# Patient Record
Sex: Female | Born: 1955
Health system: Southern US, Community
[De-identification: ages and names within clinical notes are randomized; demographics above are authoritative.]

## PROBLEM LIST (undated history)

## (undated) DIAGNOSIS — K5120 Ulcerative (chronic) proctitis without complications: Secondary | ICD-10-CM

## (undated) DIAGNOSIS — K648 Other hemorrhoids: Secondary | ICD-10-CM

## (undated) DIAGNOSIS — Z9889 Other specified postprocedural states: Secondary | ICD-10-CM

## (undated) DIAGNOSIS — T7840XA Allergy, unspecified, initial encounter: Secondary | ICD-10-CM

## (undated) DIAGNOSIS — N760 Acute vaginitis: Secondary | ICD-10-CM

## (undated) DIAGNOSIS — M1990 Unspecified osteoarthritis, unspecified site: Secondary | ICD-10-CM

## (undated) DIAGNOSIS — M7500 Adhesive capsulitis of unspecified shoulder: Secondary | ICD-10-CM

## (undated) DIAGNOSIS — I10 Essential (primary) hypertension: Secondary | ICD-10-CM

## (undated) DIAGNOSIS — K270 Acute peptic ulcer, site unspecified, with hemorrhage: Secondary | ICD-10-CM

## (undated) DIAGNOSIS — E785 Hyperlipidemia, unspecified: Secondary | ICD-10-CM

## (undated) DIAGNOSIS — G2581 Restless legs syndrome: Secondary | ICD-10-CM

## (undated) DIAGNOSIS — M8580 Other specified disorders of bone density and structure, unspecified site: Secondary | ICD-10-CM

## (undated) DIAGNOSIS — F419 Anxiety disorder, unspecified: Secondary | ICD-10-CM

## (undated) DIAGNOSIS — R112 Nausea with vomiting, unspecified: Secondary | ICD-10-CM

## (undated) DIAGNOSIS — M949 Disorder of cartilage, unspecified: Secondary | ICD-10-CM

## (undated) DIAGNOSIS — G4730 Sleep apnea, unspecified: Secondary | ICD-10-CM

## (undated) DIAGNOSIS — G4733 Obstructive sleep apnea (adult) (pediatric): Secondary | ICD-10-CM

## (undated) DIAGNOSIS — K219 Gastro-esophageal reflux disease without esophagitis: Secondary | ICD-10-CM

## (undated) DIAGNOSIS — M797 Fibromyalgia: Secondary | ICD-10-CM

## (undated) DIAGNOSIS — D126 Benign neoplasm of colon, unspecified: Secondary | ICD-10-CM

## (undated) DIAGNOSIS — E0590 Thyrotoxicosis, unspecified without thyrotoxic crisis or storm: Secondary | ICD-10-CM

## (undated) DIAGNOSIS — E039 Hypothyroidism, unspecified: Secondary | ICD-10-CM

## (undated) DIAGNOSIS — F518 Other sleep disorders not due to a substance or known physiological condition: Secondary | ICD-10-CM

## (undated) DIAGNOSIS — M899 Disorder of bone, unspecified: Secondary | ICD-10-CM

## (undated) DIAGNOSIS — E559 Vitamin D deficiency, unspecified: Secondary | ICD-10-CM

## (undated) DIAGNOSIS — U071 COVID-19: Secondary | ICD-10-CM

## (undated) HISTORY — DX: Essential (primary) hypertension: I10

## (undated) HISTORY — DX: Acute peptic ulcer, site unspecified, with hemorrhage: K27.0

## (undated) HISTORY — DX: Unspecified osteoarthritis, unspecified site: M19.90

## (undated) HISTORY — DX: Allergy, unspecified, initial encounter: T78.40XA

## (undated) HISTORY — PX: LAPAROSCOPY: SHX197

## (undated) HISTORY — PX: SIGMOIDOSCOPY: SUR1295

## (undated) HISTORY — DX: Vitamin D deficiency, unspecified: E55.9

## (undated) HISTORY — DX: Other sleep disorders not due to a substance or known physiological condition: F51.8

## (undated) HISTORY — DX: Gastro-esophageal reflux disease without esophagitis: K21.9

## (undated) HISTORY — DX: Hyperlipidemia, unspecified: E78.5

## (undated) HISTORY — DX: Fibromyalgia: M79.7

## (undated) HISTORY — DX: Obstructive sleep apnea (adult) (pediatric): G47.33

## (undated) HISTORY — DX: Restless legs syndrome: G25.81

## (undated) HISTORY — PX: UPPER GASTROINTESTINAL ENDOSCOPY: SHX188

## (undated) HISTORY — PX: BREAST CYST ASPIRATION: SHX578

## (undated) HISTORY — PX: ABDOMINAL HYSTERECTOMY: SHX81

## (undated) HISTORY — DX: Disorder of cartilage, unspecified: M94.9

## (undated) HISTORY — DX: Adhesive capsulitis of unspecified shoulder: M75.00

## (undated) HISTORY — DX: Thyrotoxicosis, unspecified without thyrotoxic crisis or storm: E05.90

## (undated) HISTORY — DX: Ulcerative (chronic) proctitis without complications: K51.20

## (undated) HISTORY — DX: Hypothyroidism, unspecified: E03.9

## (undated) HISTORY — DX: Other hemorrhoids: K64.8

## (undated) HISTORY — DX: Sleep apnea, unspecified: G47.30

## (undated) HISTORY — DX: Anxiety disorder, unspecified: F41.9

## (undated) HISTORY — DX: Benign neoplasm of colon, unspecified: D12.6

## (undated) HISTORY — PX: KNEE ARTHROSCOPY: SUR90

## (undated) HISTORY — PX: NASAL SEPTUM SURGERY: SHX37

## (undated) HISTORY — DX: Disorder of bone, unspecified: M89.9

## (undated) HISTORY — DX: COVID-19: U07.1

## (undated) HISTORY — PX: COLONOSCOPY: SHX174

## (undated) HISTORY — PX: TONSILLECTOMY AND ADENOIDECTOMY: SUR1326

---

## 1999-03-07 ENCOUNTER — Other Ambulatory Visit: Admission: RE | Admit: 1999-03-07 | Discharge: 1999-03-07 | Payer: Self-pay | Admitting: Obstetrics and Gynecology

## 2001-03-11 ENCOUNTER — Other Ambulatory Visit: Admission: RE | Admit: 2001-03-11 | Discharge: 2001-03-11 | Payer: Self-pay | Admitting: Obstetrics and Gynecology

## 2002-04-22 ENCOUNTER — Other Ambulatory Visit: Admission: RE | Admit: 2002-04-22 | Discharge: 2002-04-22 | Payer: Self-pay | Admitting: Obstetrics and Gynecology

## 2002-05-05 ENCOUNTER — Encounter: Payer: Self-pay | Admitting: Pulmonary Disease

## 2002-06-07 ENCOUNTER — Ambulatory Visit (HOSPITAL_BASED_OUTPATIENT_CLINIC_OR_DEPARTMENT_OTHER): Admission: RE | Admit: 2002-06-07 | Discharge: 2002-06-07 | Payer: Self-pay | Admitting: Pulmonary Disease

## 2002-06-07 ENCOUNTER — Encounter: Payer: Self-pay | Admitting: Pulmonary Disease

## 2002-11-04 ENCOUNTER — Ambulatory Visit (HOSPITAL_COMMUNITY): Admission: RE | Admit: 2002-11-04 | Discharge: 2002-11-05 | Payer: Self-pay | Admitting: Otolaryngology

## 2002-11-04 ENCOUNTER — Encounter (INDEPENDENT_AMBULATORY_CARE_PROVIDER_SITE_OTHER): Payer: Self-pay | Admitting: *Deleted

## 2003-08-03 ENCOUNTER — Encounter: Payer: Self-pay | Admitting: Internal Medicine

## 2003-08-07 ENCOUNTER — Encounter: Admission: RE | Admit: 2003-08-07 | Discharge: 2003-08-07 | Payer: Self-pay | Admitting: Rheumatology

## 2004-03-01 ENCOUNTER — Ambulatory Visit: Payer: Self-pay | Admitting: Pulmonary Disease

## 2004-04-14 ENCOUNTER — Encounter: Payer: Self-pay | Admitting: Pulmonary Disease

## 2004-04-14 ENCOUNTER — Ambulatory Visit (HOSPITAL_BASED_OUTPATIENT_CLINIC_OR_DEPARTMENT_OTHER): Admission: RE | Admit: 2004-04-14 | Discharge: 2004-04-14 | Payer: Self-pay | Admitting: Pulmonary Disease

## 2004-04-21 ENCOUNTER — Ambulatory Visit: Payer: Self-pay | Admitting: Pulmonary Disease

## 2004-05-10 ENCOUNTER — Ambulatory Visit: Payer: Self-pay | Admitting: Pulmonary Disease

## 2005-03-13 ENCOUNTER — Ambulatory Visit: Payer: Self-pay | Admitting: Pulmonary Disease

## 2005-08-20 ENCOUNTER — Ambulatory Visit: Payer: Self-pay | Admitting: Internal Medicine

## 2006-09-16 ENCOUNTER — Ambulatory Visit: Payer: Self-pay | Admitting: Pulmonary Disease

## 2007-06-18 ENCOUNTER — Encounter: Payer: Self-pay | Admitting: Pulmonary Disease

## 2007-08-01 DIAGNOSIS — K270 Acute peptic ulcer, site unspecified, with hemorrhage: Secondary | ICD-10-CM | POA: Insufficient documentation

## 2007-11-05 ENCOUNTER — Ambulatory Visit: Payer: Self-pay | Admitting: Internal Medicine

## 2007-11-05 DIAGNOSIS — M949 Disorder of cartilage, unspecified: Secondary | ICD-10-CM

## 2007-11-05 DIAGNOSIS — G2581 Restless legs syndrome: Secondary | ICD-10-CM | POA: Insufficient documentation

## 2007-11-05 DIAGNOSIS — E039 Hypothyroidism, unspecified: Secondary | ICD-10-CM | POA: Insufficient documentation

## 2007-11-05 DIAGNOSIS — M899 Disorder of bone, unspecified: Secondary | ICD-10-CM

## 2007-11-05 DIAGNOSIS — K5900 Constipation, unspecified: Secondary | ICD-10-CM | POA: Insufficient documentation

## 2007-11-05 DIAGNOSIS — G4733 Obstructive sleep apnea (adult) (pediatric): Secondary | ICD-10-CM

## 2007-11-05 DIAGNOSIS — I10 Essential (primary) hypertension: Secondary | ICD-10-CM | POA: Insufficient documentation

## 2007-11-05 DIAGNOSIS — M810 Age-related osteoporosis without current pathological fracture: Secondary | ICD-10-CM | POA: Insufficient documentation

## 2007-11-05 DIAGNOSIS — K219 Gastro-esophageal reflux disease without esophagitis: Secondary | ICD-10-CM | POA: Insufficient documentation

## 2007-11-05 DIAGNOSIS — E559 Vitamin D deficiency, unspecified: Secondary | ICD-10-CM | POA: Insufficient documentation

## 2007-11-05 DIAGNOSIS — D126 Benign neoplasm of colon, unspecified: Secondary | ICD-10-CM

## 2007-11-05 DIAGNOSIS — K589 Irritable bowel syndrome without diarrhea: Secondary | ICD-10-CM | POA: Insufficient documentation

## 2007-11-05 DIAGNOSIS — E785 Hyperlipidemia, unspecified: Secondary | ICD-10-CM | POA: Insufficient documentation

## 2007-11-19 ENCOUNTER — Telehealth (INDEPENDENT_AMBULATORY_CARE_PROVIDER_SITE_OTHER): Payer: Self-pay | Admitting: *Deleted

## 2007-12-03 ENCOUNTER — Telehealth (INDEPENDENT_AMBULATORY_CARE_PROVIDER_SITE_OTHER): Payer: Self-pay | Admitting: *Deleted

## 2007-12-03 ENCOUNTER — Ambulatory Visit: Payer: Self-pay | Admitting: Pulmonary Disease

## 2007-12-03 DIAGNOSIS — F518 Other sleep disorders not due to a substance or known physiological condition: Secondary | ICD-10-CM

## 2007-12-03 DIAGNOSIS — G4700 Insomnia, unspecified: Secondary | ICD-10-CM | POA: Insufficient documentation

## 2008-02-11 ENCOUNTER — Telehealth: Payer: Self-pay | Admitting: Internal Medicine

## 2008-02-13 ENCOUNTER — Ambulatory Visit: Payer: Self-pay | Admitting: Gastroenterology

## 2008-02-13 DIAGNOSIS — Z860100 Personal history of colon polyps, unspecified: Secondary | ICD-10-CM | POA: Insufficient documentation

## 2008-02-13 DIAGNOSIS — K625 Hemorrhage of anus and rectum: Secondary | ICD-10-CM | POA: Insufficient documentation

## 2008-03-16 DIAGNOSIS — IMO0001 Reserved for inherently not codable concepts without codable children: Secondary | ICD-10-CM | POA: Insufficient documentation

## 2008-03-17 ENCOUNTER — Encounter: Payer: Self-pay | Admitting: Internal Medicine

## 2008-03-22 ENCOUNTER — Ambulatory Visit: Payer: Self-pay | Admitting: Internal Medicine

## 2008-03-24 ENCOUNTER — Telehealth: Payer: Self-pay | Admitting: Internal Medicine

## 2008-03-25 ENCOUNTER — Encounter: Payer: Self-pay | Admitting: Internal Medicine

## 2008-03-25 ENCOUNTER — Ambulatory Visit: Payer: Self-pay | Admitting: Internal Medicine

## 2008-03-27 ENCOUNTER — Encounter: Payer: Self-pay | Admitting: Internal Medicine

## 2008-05-05 ENCOUNTER — Ambulatory Visit: Payer: Self-pay | Admitting: Internal Medicine

## 2008-05-05 DIAGNOSIS — K5120 Ulcerative (chronic) proctitis without complications: Secondary | ICD-10-CM

## 2008-09-15 ENCOUNTER — Encounter: Admission: RE | Admit: 2008-09-15 | Discharge: 2008-12-14 | Payer: Self-pay | Admitting: Rheumatology

## 2009-01-28 ENCOUNTER — Ambulatory Visit: Payer: Self-pay | Admitting: Pulmonary Disease

## 2009-01-28 DIAGNOSIS — G4710 Hypersomnia, unspecified: Secondary | ICD-10-CM | POA: Insufficient documentation

## 2009-02-22 ENCOUNTER — Encounter: Payer: Self-pay | Admitting: Pulmonary Disease

## 2009-02-22 ENCOUNTER — Ambulatory Visit (HOSPITAL_BASED_OUTPATIENT_CLINIC_OR_DEPARTMENT_OTHER): Admission: RE | Admit: 2009-02-22 | Discharge: 2009-02-22 | Payer: Self-pay | Admitting: Pulmonary Disease

## 2009-03-08 ENCOUNTER — Ambulatory Visit: Payer: Self-pay | Admitting: Pulmonary Disease

## 2009-03-16 ENCOUNTER — Encounter: Payer: Self-pay | Admitting: Physician Assistant

## 2009-03-17 ENCOUNTER — Telehealth: Payer: Self-pay | Admitting: Internal Medicine

## 2009-03-18 ENCOUNTER — Ambulatory Visit: Payer: Self-pay | Admitting: Gastroenterology

## 2009-03-18 DIAGNOSIS — R197 Diarrhea, unspecified: Secondary | ICD-10-CM | POA: Insufficient documentation

## 2009-03-21 ENCOUNTER — Encounter: Payer: Self-pay | Admitting: Physician Assistant

## 2009-03-21 LAB — CONVERTED CEMR LAB
Basophils Relative: 5.1 % — ABNORMAL HIGH (ref 0.0–3.0)
Lymphocytes Relative: 26.5 % (ref 12.0–46.0)
MCHC: 32.9 g/dL (ref 30.0–36.0)
MCV: 92.4 fL (ref 78.0–100.0)
Monocytes Relative: 7.7 % (ref 3.0–12.0)
Neutro Abs: 3.9 10*3/uL (ref 1.4–7.7)
Neutrophils Relative %: 54.9 % (ref 43.0–77.0)
RBC: 4.47 M/uL (ref 3.87–5.11)
RDW: 12.8 % (ref 11.5–14.6)
WBC: 7.1 10*3/uL (ref 4.5–10.5)

## 2009-04-26 ENCOUNTER — Ambulatory Visit: Payer: Self-pay | Admitting: Internal Medicine

## 2009-05-23 ENCOUNTER — Telehealth: Payer: Self-pay | Admitting: Pulmonary Disease

## 2009-06-27 ENCOUNTER — Telehealth: Payer: Self-pay | Admitting: Internal Medicine

## 2009-07-26 ENCOUNTER — Telehealth (INDEPENDENT_AMBULATORY_CARE_PROVIDER_SITE_OTHER): Payer: Self-pay | Admitting: *Deleted

## 2009-08-03 ENCOUNTER — Ambulatory Visit: Payer: Self-pay | Admitting: Internal Medicine

## 2010-03-28 NOTE — Progress Notes (Signed)
Summary: Canasa  Phone Note Outgoing Call   Call placed by: Lamona Curl CMA Duncan Dull),  Jun 27, 2009 11:53 AM Call placed to: Patient Summary of Call: I have called patient x 2 and have left messages for her to call back regarding her canasa suppositories. I have again called her today and left a message for her to return my call. I denied a three month prescription for canasa because patient is due for an office visit and I want to make certain that she does follow up. I will be glad to send her enough suppositories to a local pharmacy to get her through until her office visit at which time a decision can be made as to whether this will be a long term thing. However, I have been unable to contact patient or get her to return my calls for me to tell her this. I will await a return call from her at this time. Initial call taken by: Lamona Curl CMA Duncan Dull),  Jun 27, 2009 11:56 AM     Appended Document: Canasa    Clinical Lists Changes  Medications: Changed medication from CANASA 1000 MG SUPP (MESALAMINE) Insert 1 suppository into rectum two times a day as directed. to CANASA 1000 MG SUPP (MESALAMINE) Insert 1 suppository into rectum two times a day as directed. - Signed Rx of CANASA 1000 MG SUPP (MESALAMINE) Insert 1 suppository into rectum two times a day as directed.;  #60 x 0;  Signed;  Entered by: Lamona Curl CMA (AAMA);  Authorized by: Hart Carwin MD;  Method used: Electronically to [facility] [address] [place], [place]  [address], Ph: [phone], Fax: [phone]    Prescriptions: CANASA 1000 MG SUPP (MESALAMINE) Insert 1 suppository into rectum two times a day as directed.  #60 x 0   Entered by:   Lamona Curl CMA (AAMA)   Authorized by:   Hart Carwin MD   Signed by:   Lamona Curl CMA (AAMA) on 06/28/2009   Method used:   Electronically to        [facility] (retail)       [address]       [place], [place]  [id number]       Ph: [phone]       Fax: [phone]   RxID:   [id number]

## 2010-03-28 NOTE — Assessment & Plan Note (Signed)
Summary: ULCERATIVE COLITIS FLARE/BLEEDING FOR 4 WEEKS      (DR.BRODIE...   History of Present Illness Visit Type: Follow-up Visit Primary GI MD: Lina Sar MD Primary Provider: Adrian Prince, MD Requesting Provider: n/a Chief Complaint: UC flare, rectal bleeding History of Present Illness:   55 YO FEMALE KNOWN TO DR Juanda Chance WHO WAS DX WITH ULCERATIVE PROCTITIS ABOUT ONE YEAR AGO. SHE HAS BEEN TRATED WITH CANASA SUPPOSITORIES AND BRIEFLY TOOK PROCTOFOAM BUT STOPPED DUE TO BURNING. SHE WAS ILL OVER THE HOLIDAYS AND WAS TREATED WITH OMNICEF AND A STEROID DOSE PACK. SINCE THAT TIME HAS HAD FREQUENT LOOSE STOOLS, URGENCY, AND HAS BEEN PASSING BLOOD AND MUCOUS WITH BMS.SHE HAD A FEW DAYS OF UPPER ABDOMINAL DISCOMFORT WHICH HAVE SINCE RESOLVED. HAS ONLY HAS ONE BM TODAY, GENERALLY 3-4 DAILY POST PRANDIALLY OVER THE PAST COUPLE WEEKS.   GI Review of Systems    Reports abdominal pain and  acid reflux.     Location of  Abdominal pain: RLQ.    Denies belching, bloating, chest pain, dysphagia with liquids, dysphagia with solids, heartburn, loss of appetite, nausea, vomiting, vomiting blood, and  weight loss.      Reports change in bowel habits, diarrhea, and  rectal bleeding.     Denies anal fissure, black tarry stools, diverticulosis, fecal incontinence, heme positive stool, hemorrhoids, irritable bowel syndrome, jaundice, light color stool, liver problems, and  rectal pain.   Current Medications (verified): 1)  Mirapex 1 Mg  Tabs (Pramipexole Dihydrochloride) .... 1/2 Tab By Mouth At Bedtime 2)  Calcium Carbonate-Vitamin D 600-400 Mg-Unit  Tabs (Calcium Carbonate-Vitamin D) .... Take 3 Tabs By Mouth Daily 3)  Vitamin D 52841 Unit Caps (Ergocalciferol) .... Once A Week 4)  Vivelle-Dot 0.05 Mg/24hr Pttw (Estradiol) .... Twice A Week 5)  Synthroid 137 Mcg Tabs (Levothyroxine Sodium) .... One Tablet By Mouth Once Daily 6)  Zoloft 50 Mg Tabs (Sertraline Hcl) .... 1.5 Tabs By Mouth Once Daily 7)   Spironolactone 25 Mg Tabs (Spironolactone) .... Take 1 Tablet By Mouth Two Times A Day 8)  Effexor Xr 75 Mg Xr24h-Cap (Venlafaxine Hcl) .... 4 Tabs By Mouth Daily 9)  Alprazolam 1 Mg Tabs (Alprazolam) .... 1.5 Tabs By Mouth At Bedtime 10)  Flexeril 10 Mg Tabs (Cyclobenzaprine Hcl) .... Take 1 Tab By Mouth At Bedtime 11)  Keppra 500 Mg Tabs (Levetiracetam) .... 3 Tabs By Mouth At Bedtime 12)  Prilosec 40 Mg Cpdr (Omeprazole) .... Take 1 Tablet By Mouth Once A Day 13)  Canasa 1000 Mg  Supp (Mesalamine) .... Insert 1 Suppository Into Rectum At Bedtime As Needed 14)  Milk of Magnesia .... As Needed  Allergies (verified): 1)  ! Tetracycline 2)  ! * Oxycodone  Past History:  Past Medical History: ULCERATIVE PROCTITIS FIBROMYALGIA (ICD-729.1) Hx of ACUTE PEPTC ULCR UNS SITE W/HEM W/O MENTION OBST (ICD-533.00) PERSONAL HX COLONIC POLYPS (ICD-V12.72) INADEQUATE SLEEP HYGIENE (ICD-307.49) HYPOTHYROIDISM (ICD-244.9) OBSTRUCTIVE SLEEP APNEA (ICD-327.23) RESTLESS LEG SYNDROME (ICD-333.94) OSTEOPENIA (ICD-733.90) VITAMIN D DEFICIENCY (ICD-268.9) HYPERLIPIDEMIA (ICD-272.4) HYPERTENSION (ICD-401.9) CONSTIPATION (ICD-564.00) GERD (ICD-530.81)      Past Surgical History: Reviewed history from 03/16/2008 and no changes required. c-section Hysterectomy Knee Arthroscopy- right Tonsillectomy lapraroscopies - x 4 right breast cyst aspiration  Family History: Reviewed history from 02/13/2008 and no changes required. Family History of Colon Cancer:Grandmother  Family History of Breast Cancer:grandmother, aunt Luekemia P grandfather Lung cancer- M granfather Family History of Kidney Disease: Mother   due to diaabetes Family History of Diabetes: Mother, father  Social  History: Reviewed history from 02/13/2008 and no changes required. Patient has never smoked.  Alcohol Use - no Illicit Drug Use - no Daily Caffeine Use 1 cup coffee per day  Review of Systems  The patient denies  allergy/sinus, anemia, anxiety-new, arthritis/joint pain, back pain, blood in urine, breast changes/lumps, change in vision, confusion, cough, coughing up blood, depression-new, fainting, fatigue, fever, headaches-new, hearing problems, heart murmur, heart rhythm changes, itching, menstrual pain, muscle pains/cramps, night sweats, nosebleeds, pregnancy symptoms, shortness of breath, skin rash, sleeping problems, sore throat, swelling of feet/legs, swollen lymph glands, thirst - excessive , urination - excessive , urination changes/pain, urine leakage, vision changes, and voice change.         ROS OTHERWISE AS IN HPI  Vital Signs:  Patient profile:   55 year old female Height:      68 inches Weight:      189.38 pounds BMI:     28.90 Pulse rate:   68 / minute Pulse rhythm:   regular BP sitting:   110 / 64  (left arm)  Vitals Entered By: Chales Abrahams CMA Duncan Dull) (March 18, 2009 2:12 PM)  Physical Exam  General:  Well developed, well nourished, no acute distress. Head:  Normocephalic and atraumatic. Eyes:  PERRLA, no icterus. Lungs:  Clear throughout to auscultation. Heart:  Regular rate and rhythm; no murmurs, rubs,  or bruits. Abdomen:  SOFT, BASICALLY NONTENDER, MILD RLQ TENDERNESS AT EDGE OF HYSTERECTOMY SCAR, BS+,NO MASS OR HSM Rectal:  NOT DONE Extremities:  No clubbing, cyanosis, edema or deformities noted. Neurologic:  Alert and  oriented x4;  grossly normal neurologically. Psych:  Alert and cooperative. Normal mood and affect.  Impression & Recommendations:  Problem # 1:  ULCERATIVE PROCTITIS (ICD-556.2) Assessment Deteriorated 55 YO WITH ULCERATIVE PROCTITIS WITH EXACERBATION OF SXS WITH BLOOD AND MUCOID /LOOSE STOOLS X 2 WEEKS, POST ABX FOR URI. SUSPECT COLITIS EXACERBATION, R/O C. DIFF THOUGH LESS LIKELY STOOL CULTURE AS BELOW CBC TODAY START ALIGN ONE DAILY INCREASE CANASA TO TWICE DAILY X 3 WEEKS START LIALDA 1.2MG ;2 TABLETS TWICE DAILY FOLLOW UP WITH DR Juanda Chance IN 2-3  WEEKS. Orders: T-Culture, C-Diff Toxin A/B ([id number]) TLB-CBC Platelet - w/Differential ([id number])  Problem # 2:  Family Hx of COLON CANCER (ICD-153.9) Assessment: Comment Only LAST COLONOSCOPY 1/10  NO POLYPS  Problem # 3:  FIBROMYALGIA (ICD-729.1) Assessment: Comment Only  Problem # 4:  RESTLESS LEG SYNDROME (ICD-333.94) Assessment: Comment Only  Patient Instructions: 1)  Please go to the lab, basement level. 2)  Increase the Canasa Suppositories to twice daily x 3 weeks. 3)  Take Align capsule 1 daily x 30 days. Coupon provided. 4)  Lialda take 2 tab 5)  We made you a follow up appoinitment with Dr. Juanda Chance for 04-26-09.  6)  Copy sent to : Dr. Evlyn Kanner 7)  The medication list was reviewed and reconciled.  All changed / newly prescribed medications were explained.  A complete medication list was provided to the patient / caregiver.  Prescriptions: LIALDA 1.2 GM TBEC (MESALAMINE) Take 2 tab twice daily  #120 x 1   Entered by:   Lowry Ram NCMA   Authorized by:   Sammuel Cooper PA-c   Signed by:   Lowry Ram NCMA on 03/18/2009   Method used:   Electronically to        [facility] (retail)       [address]       [place], [place]  [address]  Ph: [phone]       Fax: [phone]   RxID:   [id number] CANASA 1000 MG SUPP (MESALAMINE) Use suppository twice daily x 3 weeks  #42 x 0   Entered by:   Lowry Ram NCMA   Authorized by:   Sammuel Cooper PA-c   Signed by:   Lowry Ram NCMA on 03/18/2009   Method used:   Electronically to        [facility] (retail)       [address]       [place], [place]  [id number]       Ph: [phone]       Fax: [phone]   RxID:   [id number]

## 2010-03-28 NOTE — Assessment & Plan Note (Signed)
Summary: follow up appt/lk   History of Present Illness Visit Type: Follow-up Visit Primary GI MD: Lina Sar MD Primary Provider: Adrian Prince, MD Requesting Provider: n/a Chief Complaint: Patient here for routine follow up of colitis. She denies any current GI symptoms. No abdominal pain, change in bowels or blood in the stool. History of Present Illness:   This is a 55 year old white female with ulcerative proctitis demonstrated on a colonoscopy in January 2010. She had an exacerbation of her colitis after a course of broad-spectrum antibiotics for an upper respiratory infection. She was put on Canasa suppositories twice a day, probiotics and mesalamine 1.2 g daily. She has intentionally lost 20 pounds and her gastroesophageal reflux has also improved. The last upper endoscopy was in June 2005. There is a family history of colon cancer and a personal history of a tubular adenoma found on a colonoscopy in June 2005. Patient comes today for a routine follow up of her colitis. She states that she is doing well and denies any gastroentestinal symptoms at this time. There is no abdominal pain, change in bowels or appearance of blood in the stool.   GI Review of Systems    Reports weight loss.   Weight loss of 20  pounds over 5 months.   Denies abdominal pain, acid reflux, belching, bloating, chest pain, dysphagia with liquids, dysphagia with solids, heartburn, loss of appetite, nausea, vomiting, vomiting blood, and  weight gain.        Denies anal fissure, black tarry stools, change in bowel habit, constipation, diarrhea, diverticulosis, fecal incontinence, heme positive stool, hemorrhoids, irritable bowel syndrome, jaundice, light color stool, liver problems, rectal bleeding, and  rectal pain.    Current Medications (verified): 1)  Mirapex 0.5 Mg Tabs (Pramipexole Dihydrochloride) .Marland Kitchen.. 1 By Mouth At Bedtime 2)  Calcium Carbonate-Vitamin D 600-400 Mg-Unit  Tabs (Calcium Carbonate-Vitamin D)  .... Take 3 Tabs By Mouth Daily 3)  Vitamin D 16109 Unit Caps (Ergocalciferol) .... Once A Week 4)  Vivelle-Dot 0.05 Mg/24hr Pttw (Estradiol) .... Twice A Week 5)  Synthroid 150 Mcg Tabs (Levothyroxine Sodium) .... Take 1 Tablet By Mouth Once A Day 6)  Zoloft 50 Mg Tabs (Sertraline Hcl) .... 1.5 Tabs By Mouth Once Daily 7)  Spironolactone 25 Mg Tabs (Spironolactone) .... Take 1 Tablet By Mouth Two Times A Day 8)  Alprazolam 1 Mg Tabs (Alprazolam) .... 1.5 Tabs By Mouth At Bedtime 9)  Flexeril 10 Mg Tabs (Cyclobenzaprine Hcl) .... Take 1 Tab By Mouth At Bedtime 10)  Keppra 500 Mg Tabs (Levetiracetam) .... 2 Tabs By Mouth At Bedtime 11)  Canasa 1000 Mg Supp (Mesalamine) .... Insert 1 Suppository Into Rectum Once Daily As Directed 12)  Lialda 1.2 Gm Tbec (Mesalamine) .... Take 1 Tablet By Mouth Once A Day  Allergies (verified): 1)  ! Tetracycline 2)  ! * Oxycodone  Past History:  Past Medical History: Reviewed history from 03/18/2009 and no changes required. ULCERATIVE PROCTITIS FIBROMYALGIA (ICD-729.1) Hx of ACUTE PEPTC ULCR UNS SITE W/HEM W/O MENTION OBST (ICD-533.00) PERSONAL HX COLONIC POLYPS (ICD-V12.72) INADEQUATE SLEEP HYGIENE (ICD-307.49) HYPOTHYROIDISM (ICD-244.9) OBSTRUCTIVE SLEEP APNEA (ICD-327.23) RESTLESS LEG SYNDROME (ICD-333.94) OSTEOPENIA (ICD-733.90) VITAMIN D DEFICIENCY (ICD-268.9) HYPERLIPIDEMIA (ICD-272.4) HYPERTENSION (ICD-401.9) CONSTIPATION (ICD-564.00) GERD (ICD-530.81)      Past Surgical History: Reviewed history from 04/26/2009 and no changes required. c-section Hysterectomy Knee Arthroscopy- right x2 Tonsillectomy & adnoids lapraroscopies - x 4 right breast cyst aspiration deviated septum/ UP3 - Dr. Shelle Iron  Family History: Reviewed history from 02/13/2008 and  no changes required. Family History of Colon Cancer:Grandmother  Family History of Breast Cancer:grandmother, aunt Luekemia P grandfather Lung cancer- M granfather Family History of  Kidney Disease: Mother   due to diaabetes Family History of Diabetes: Mother, father Melanoma-Sister  Social History: Reviewed history from 04/26/2009 and no changes required. Alcohol Use - no Illicit Drug Use - no Daily Caffeine Use 1 cup coffee per day Patient is a former smoker. Stopped @31  Patient gets regular exercise. Occupation: Manufacturing engineer  Review of Systems  The patient denies allergy/sinus, anemia, anxiety-new, arthritis/joint pain, back pain, blood in urine, breast changes/lumps, change in vision, confusion, cough, coughing up blood, depression-new, fainting, fatigue, fever, headaches-new, hearing problems, heart murmur, heart rhythm changes, itching, menstrual pain, muscle pains/cramps, night sweats, nosebleeds, pregnancy symptoms, shortness of breath, skin rash, sleeping problems, sore throat, swelling of feet/legs, swollen lymph glands, thirst - excessive , urination - excessive , urination changes/pain, urine leakage, vision changes, and voice change.         Pertinent positive and negative review of systems were noted in the above HPI. All other ROS was otherwise negative.   Vital Signs:  Patient profile:   55 year old female Height:      68 inches Weight:      174.50 pounds BMI:     26.63 BSA:     1.93 Pulse rate:   80 / minute Pulse rhythm:   regular BP sitting:   108 / 80  (right arm)  Vitals Entered By: Lamona Curl CMA Duncan Dull) (August 03, 2009 8:41 AM)  Physical Exam  General:  Well developed, well nourished, no acute distress. Lungs:  Clear throughout to auscultation. Heart:  Regular rate and rhythm; no murmurs, rubs,  or bruits. Abdomen:  Soft, nontender and nondistended. No masses, hepatosplenomegaly or hernias noted. Normal bowel sounds. Rectal:  anoscopic exam reveals normal appearing rectal mucosa with no evidence of inflammation, exudate or ulceration. Stool is Hemoccult negative. Extremities:  No clubbing, cyanosis, edema or deformities  noted. Skin:  Intact without significant lesions or rashes. Psych:  Alert and cooperative. Normal mood and affect.   Impression & Recommendations:  Problem # 1:  ULCERATIVE PROCTITIS (ICD-556.2) Patient's ulcerative colitis is in remission. She may decrease her Canasa suppositories to a maintenance dose of 1,000 mg weekly and may also discontinue her Lialda 1.2 g daily. She will maintain a high-fiber, low-fat diet and will follow up with [place] in one year.  Problem # 2:  Family Hx of COLON CANCER (ICD-153.9) Patient will be due for a recall colonoscopy in January 2015.  Problem # 3:  IBS (ICD-564.1) Patient has intermittent right upper quadrant abdominal pain of unknown etiology. A colonoscopy completed recently showed a normal cecum. She may need a pelvic ultrasound for further evaluation of the pain.  Patient Instructions: 1)  Canasa suppositories 1,000 mg weekly. 2)  Discontinue Lialda. 3)  high-fiber diet. 4)  Follow up with Dr.Mody for GYN exam and possible pelvic ultrasound to evaluate right lower quadrant abdominal pain. 5)  Copy sent to : Dr Nikki Dom OBGyn , Dr Kathie Rhodes.[place] 6)  The medication list was reviewed and reconciled.  All changed / newly prescribed medications were explained.  A complete medication list was provided to the patient / caregiver.

## 2010-03-28 NOTE — Progress Notes (Signed)
Summary: change mirapex dose  Phone Note Outgoing Call   Call placed by: Boone Master CNA,  May 23, 2009 5:16 PM Call placed to: Patient Summary of Call: doing refills for triage.  came upon refill request for pt's mirapex 1mg .  there is a note attached by [facility] stating "let pt know after reviewing everything, i would like to look at lowering her mirapex dose - 1mg  at bedtime can contribute to daytime sleepiness.  let's try 0.5mg  at bedtime."    LM w/ FM TCB to discuss this change with patient.  told family member to tell her to ask for me by name or ask for the triage nurse if i am unavailable.  Initial call taken by: Boone Master CNA,  May 23, 2009 5:19 PM  Follow-up for Phone Call        pt advised of the change and new rx sent. Carron Curie CMA  May 24, 2009 9:30 AM     Prescriptions: MIRAPEX 1 MG  TABS (PRAMIPEXOLE DIHYDROCHLORIDE) 1/2 tab by mouth at bedtime  #45 x 0   Entered by:   Carron Curie CMA   Authorized by:   Barbaraann Share MD   Signed by:   Carron Curie CMA on 05/24/2009   Method used:   Faxed to ...       [facility]* (mail-order)             ,          Ph: [phone]       Fax: [phone]   RxID:   [id number]

## 2010-03-28 NOTE — Progress Notes (Signed)
Summary: TRIAGE  Phone Note Call from Patient Call back at cell [phone]   Call For: Dr Juanda Chance Summary of Call: Has Ulcerative Colitits using Canasa suppositories but they are not regulating the symptoms so well.  Initial call taken by: Leanor Kail [facility],  March 17, 2009 3:07 PM  Follow-up for Phone Call        Pt. has UC. She takes [employer]. at bedtime, no other meds.  States she has had a flare since Christmas. Then was given antibiotics/prednisone for a sinus infection. Now c/o worsening abd.pain, looser stools-but pt. takes MOM every night before she goes to bed. Sees blood with every BM for weeks. Feels weak/fatigue. 1) Pt. will see Amy Esterwood PAC on 03-18-09 at 2pm 2) Have recent labs/ov note from PCP faxed 3) If symptoms become worse call back immediately or go to ER.  Follow-up by: Laureen Ochs LPN,  March 17, 2009 3:27 PM

## 2010-03-28 NOTE — Miscellaneous (Signed)
Summary: sleep study results.  Clinical Lists Changes  sleep study shows no significant osa or movement disorder.  I have called pt and discussed with her.  Suspect her sleepiness due to poor sleep hygeine, sedating meds, and anxiety.  I have asked her to work on these things, and see how she does.

## 2010-03-28 NOTE — Progress Notes (Signed)
Summary: mirapex  Phone Note Call from Patient   Caller: Patient Call For: [facility] Summary of Call: pt requests to have refill for MIRAPEX called in to [facility] but also wants a 30 day supply called in to [facility]. cell [phone] Initial call taken by: Tivis Ringer, CNA,  Jul 26, 2009 12:40 PM  Follow-up for Phone Call        a refill was sent on 05-24-09 for 90 day supply of mirapex. Pt was instructed to take 1/2 tablet of a 1 mg tab to help cut down on daytime sleepiness. Pt states when she received the shipment he tablets looked different so she thought they were a lower dose, even though the directions stated to take 1/2 tablet, so the pt has been taking 1 mg daily and has run out of tablets. Pt is requesting a refill be sent to [facility] as well as a 30 days supply to local pharmacy to last until shipement arrives. Please advise if ok to send rx. Carron Curie CMA  Jul 26, 2009 3:36 PM   Additional Follow-up for Phone Call Additional follow up Details #1::        ok to send in prescription for the LOWER dose so we do not confuse her. mirapex 0.5mg  tabs...one each night, for whatever quantity her insurance allows with refills to get her to next visit. Additional Follow-up by: Barbaraann Share MD,  July 27, 2009 6:52 AM    Additional Follow-up for Phone Call Additional follow up Details #2::    90 day supply of mirapex 0.5mg   faxed to [facility] and 30 day supply of it called to [facility] pharmacy  ok per dr clance--pt aware both rx's have been sent Follow-up by: Philipp Deputy CMA,  July 27, 2009 9:37 AM  New/Updated Medications: MIRAPEX 0.5 MG TABS (PRAMIPEXOLE DIHYDROCHLORIDE) 1 by mouth at bedtime Prescriptions: MIRAPEX 0.5 MG TABS (PRAMIPEXOLE DIHYDROCHLORIDE) 1 by mouth at bedtime  #30 x 0   Entered by:   Philipp Deputy CMA   Authorized by:   Barbaraann Share MD   Signed by:   Philipp Deputy CMA on 07/27/2009   Method used:   Electronically to        [facility]*  (retail)       [address]       [place], [place]  [address]       Ph: [phone]       Fax: [phone]   RxID:   [id number] MIRAPEX 0.5 MG TABS (PRAMIPEXOLE DIHYDROCHLORIDE) 1 by mouth at bedtime  #90 x 3   Entered by:   Philipp Deputy CMA   Authorized by:   Barbaraann Share MD   Signed by:   Philipp Deputy CMA on 07/27/2009   Method used:   Printed then faxed to ...       [facility]* (mail-order)             ,          Ph: [phone]       Fax: [phone]   RxID:   [id number]

## 2010-03-28 NOTE — Assessment & Plan Note (Signed)
Summary: F/U [facility], saw Amy Estserwood PA-C   History of Present Illness Visit Type: Follow-up Visit Primary GI MD: Lina Sar MD Primary Provider: Adrian Prince, MD Requesting Provider: n/a Chief Complaint: Patient has improved; however there is still residual dark blood in stool History of Present Illness:   This is a 55 year old white female with ulcerative proctitis demonstrated on a colonoscopy in January 2010. She had an exacerbation of her colitis after a course of broad-spectrum antibiotics for an upper respiratory infection. She is now slowly going into remission on Canasa suppositories twice a day, probiotics and mesalamine 1.2 g daily. She was unable to tolerate the 2.4 g. She still sees occasional blood but her abdominal symptoms have subsided and her bowel habits are regular. She intentionally lost 15 pounds and her gastroesophageal reflux has also improved. The last upper endoscopy was in June 2005. There is a family history of colon cancer and a personal history of a tubular adenoma found on a colonoscopy in June 2005.   GI Review of Systems    Reports abdominal pain.     Location of  Abdominal pain: RLQ.    Denies acid reflux, belching, bloating, chest pain, dysphagia with liquids, dysphagia with solids, heartburn, loss of appetite, nausea, vomiting, vomiting blood, weight loss, and  weight gain.      Reports black tarry stools and  rectal bleeding.     Denies anal fissure, change in bowel habit, constipation, diarrhea, diverticulosis, fecal incontinence, heme positive stool, hemorrhoids, irritable bowel syndrome, jaundice, light color stool, liver problems, and  rectal pain. Preventive Screening-Counseling & Management  Alcohol-Tobacco     Smoking Status: quit  Caffeine-Diet-Exercise     Does Patient Exercise: yes    Current Medications (verified): 1)  Mirapex 1 Mg  Tabs (Pramipexole Dihydrochloride) .... 1/2 Tab By Mouth At Bedtime 2)  Calcium Carbonate-Vitamin D  600-400 Mg-Unit  Tabs (Calcium Carbonate-Vitamin D) .... Take 3 Tabs By Mouth Daily 3)  Vitamin D 62130 Unit Caps (Ergocalciferol) .... Once A Week 4)  Vivelle-Dot 0.05 Mg/24hr Pttw (Estradiol) .... Twice A Week 5)  Synthroid 137 Mcg Tabs (Levothyroxine Sodium) .... One Tablet By Mouth Once Daily 6)  Zoloft 50 Mg Tabs (Sertraline Hcl) .... 1.5 Tabs By Mouth Once Daily 7)  Spironolactone 25 Mg Tabs (Spironolactone) .... Take 1 Tablet By Mouth Two Times A Day 8)  Effexor Xr 75 Mg Xr24h-Cap (Venlafaxine Hcl) .... 2 Tabs By Mouth Daily 9)  Alprazolam 1 Mg Tabs (Alprazolam) .... 1.5 Tabs By Mouth At Bedtime 10)  Flexeril 10 Mg Tabs (Cyclobenzaprine Hcl) .... Take 1 Tab By Mouth At Bedtime 11)  Keppra 500 Mg Tabs (Levetiracetam) .... 2 Tabs By Mouth At Bedtime 12)  Canasa 1000 Mg Supp (Mesalamine) .... Use Suppository Twice Daily X 3 Weeks 13)  Lialda 1.2 Gm Tbec (Mesalamine) .... Take 1 Tab Twice Daily  Allergies (verified): 1)  ! Tetracycline 2)  ! * Oxycodone  Past History:  Past Medical History: Reviewed history from 03/18/2009 and no changes required. ULCERATIVE PROCTITIS FIBROMYALGIA (ICD-729.1) Hx of ACUTE PEPTC ULCR UNS SITE W/HEM W/O MENTION OBST (ICD-533.00) PERSONAL HX COLONIC POLYPS (ICD-V12.72) INADEQUATE SLEEP HYGIENE (ICD-307.49) HYPOTHYROIDISM (ICD-244.9) OBSTRUCTIVE SLEEP APNEA (ICD-327.23) RESTLESS LEG SYNDROME (ICD-333.94) OSTEOPENIA (ICD-733.90) VITAMIN D DEFICIENCY (ICD-268.9) HYPERLIPIDEMIA (ICD-272.4) HYPERTENSION (ICD-401.9) CONSTIPATION (ICD-564.00) GERD (ICD-530.81)      Past Surgical History: c-section Hysterectomy Knee Arthroscopy- right x2 Tonsillectomy & adnoids lapraroscopies - x 4 right breast cyst aspiration deviated septum/ UP3 - Dr. Shelle Iron  Family History: Reviewed history from 02/13/2008 and no changes required. Family History of Colon Cancer:Grandmother  Family History of Breast Cancer:grandmother, aunt Luekemia P grandfather Lung  cancer- M granfather Family History of Kidney Disease: Mother   due to diaabetes Family History of Diabetes: Mother, father  Social History: Alcohol Use - no Illicit Drug Use - no Daily Caffeine Use 1 cup coffee per day Patient is a former smoker. Stopped @31  Patient gets regular exercise. Occupation: Manufacturing engineer Smoking Status:  quit Does Patient Exercise:  yes  Review of Systems       The patient complains of allergy/sinus, arthritis/joint pain, cough, fatigue, muscle pains/cramps, sleeping problems, sore throat, and voice change.  The patient denies anxiety-new, back pain, blood in urine, breast changes/lumps, change in vision, confusion, coughing up blood, depression-new, fainting, fever, headaches-new, hearing problems, heart murmur, heart rhythm changes, itching, menstrual pain, night sweats, nosebleeds, pregnancy symptoms, shortness of breath, skin rash, swelling of feet/legs, swollen lymph glands, thirst - excessive , urination - excessive , urination changes/pain, urine leakage, and vision changes.         Pertinent positive and negative review of systems were noted in the above HPI. All other ROS was otherwise negative.   Vital Signs:  Patient profile:   55 year old female Height:      68 inches Weight:      185.50 pounds BMI:     28.31 Pulse rate:   72 / minute Pulse rhythm:   regular BP sitting:   104 / 76  (left arm) Cuff size:   regular  Vitals Entered By: June McMurray CMA Duncan Dull) (April 26, 2009 9:03 AM)  Physical Exam  General:  Well developed, well nourished, no acute distress. Eyes:  PERRLA, no icterus. Mouth:  No deformity or lesions, dentition normal. Neck:  Supple; no masses or thyromegaly. Lungs:  Clear throughout to auscultation. Heart:  Regular rate and rhythm; no murmurs, rubs,  or bruits. Abdomen:  Soft, nontender and nondistended. No masses, hepatosplenomegaly or hernias noted. Normal bowel sounds. Rectal:  rectal and anoscopic exam reveals  hyperemic mucosa of the rectal ampulla but no friability or granularity. Changes are consistent with mild proctitis. There were no hemorrhoids. Extremities:  No clubbing, cyanosis, edema or deformities noted. Skin:  Intact without significant lesions or rashes. Psych:  Alert and cooperative. Normal mood and affect.   Impression & Recommendations:  Problem # 1:  ULCERATIVE PROCTITIS (ICD-556.2) Patient has had significant improvement in her ulcerative proctitis. She will continue on Canasa suppositories, one at bedtime and mesalamine 1.2 g daily. We will reexamin her in 3 months. She needs to continue to lose weight.  Problem # 2:  Family Hx of COLON CANCER (ICD-153.9) A recall colonoscopy will be due in January 2015.  Problem # 3:  Hx of ACUT PEPTC ULCR UNS SITE W/HEM W/O MENTION OBST (ICD-533.00) asymptomatic. There are currently no symptoms of gastroesophageal reflux.  Patient Instructions: 1)  Lialda 1.2 g daily. 2)  Canasa suppositories 1,000 mg b.i.d.  3)  High-fiber diet. 4)  Continue exercise and weight loss. 5)  Recall colonoscopy in January 2015. 6)  Copy sent to : Dr Kathie Rhodes.[place] 7)  The medication list was reviewed and reconciled.  All changed / newly prescribed medications were explained.  A complete medication list was provided to the patient / caregiver. Prescriptions: CANASA 1000 MG SUPP (MESALAMINE) Insert 1 suppository into rectum two times a day as directed.  #60 x 3   Entered by:  Hortense Ramal CMA (AAMA)   Authorized by:   Hart Carwin MD   Signed by:   Hortense Ramal CMA (AAMA) on 04/26/2009   Method used:   Faxed to ...       [facility] (retail)       [address]       [place], [place]  [id number]       Ph: [phone]       Fax: [phone]   RxID:   [id number]

## 2010-03-29 ENCOUNTER — Other Ambulatory Visit: Payer: Self-pay | Admitting: Dermatology

## 2010-07-14 NOTE — Procedures (Signed)
NAMEVIEVA, Christy [name] NO.:  [id number]   MEDICAL RECORD NO.:  [id number]          PATIENT TYPE:  OUT   LOCATION:  SLEEP CENTER                 FACILITY:  [facility]   PHYSICIAN:  Marcelyn Bruins, M.D. [facility] DATE OF BIRTH:  [birth year]   DATE OF STUDY:  04/14/2004                              NOCTURNAL POLYSOMNOGRAM   REFERRING PHYSICIAN:  Marcelyn Bruins, MD   INDICATION FOR STUDY:  Hypersomnia with sleep apnea.  Epworth score is 11.   SLEEP ARCHITECTURE:  The patient had total sleep time of 395 minutes with  sleep efficiency of 91%. There was decreased REM and slow wave sleep. Sleep  onset latency was mildly prolonged, and REM latency was greatly prolonged.   IMPRESSION:  1.  Mild obstructive sleep apnea/hypopnea syndrome with a respiratory      disturbance index of 10 events per hour and O2 desaturation as low as      85%. The events were clearly worse in the supine position.  2.  Mild snoring noted throughout the study.  3.  No clinically significant cardiac arrhythmias.  4.  Large numbers of leg jerks with significant sleep disruption. Clinical      correlation is suggested.      KC/MEDQ  D:  04/21/2004 11:42:43  T:  04/22/2004 14:53:05  Job:  [id number]

## 2010-07-14 NOTE — Op Note (Signed)
[name], [name]                          ACCOUNT NO.:  [id number]   MEDICAL RECORD NO.:  [id number]                   PATIENT TYPE:  OIB   LOCATION:  2550                                 FACILITY:  [facility]   PHYSICIAN:  Suzanna Obey, M.D.                    DATE OF BIRTH:  1955/08/12   DATE OF PROCEDURE:  11/04/2002  DATE OF DISCHARGE:                                 OPERATIVE REPORT   PREOPERATIVE DIAGNOSIS:  Obstructive sleep apnea, deviated septum, turbinate  hypertrophy, and nasal dorsal hump.   POSTOPERATIVE DIAGNOSIS:  Obstructive sleep apnea, deviated septum,  turbinate hypertrophy, and nasal dorsal hump.   OPERATION PERFORMED:  Septoplasty, submucous resection of inferior  turbinates, uvulopalatopharyngoplasty, rhinoplasty.   SURGEON:  Suzanna Obey, M.D.   ANESTHESIA:  General endotracheal tube.   ESTIMATED BLOOD LOSS:  Approximately 25mL.   INDICATIONS FOR PROCEDURE:  The patient is a 55 year old who has had  problems with obstructive sleep apnea and had a significant amount of  disturbed sleep.  She has RDI of 39 on sleep study.  She has already been  wearing a dental device for TMJ problems.  She was initially consulted with  an oral surgeon who had recommended mandibular maxillary advancement for her  sleep apnea.  She decided that she would rather proceed with a standard  procedure initially to see what improvement she gets.  She understands the  risks of the procedure including bleeding, infection, chronic crusting and  drying, septal perforation, saddle nose deformity, irregular dorsal contour,  scarring, velopharyngeal insufficiency, change in the voice, and risks of  the anesthetic.  All questions were answered and consent was consent was  obtained.   DESCRIPTION OF PROCEDURE:  [name].  After adequate general endotracheal tube  anesthesia, she was placed in the supine position, prepped and  draped in the  usual sterile manner.  Oxymetazoline pledgets were placed into the nose  bilaterally and then the septum and inferior turbinates and the dorsum and  upper lateral cartilage area was injected with 1% lidocaine with 1:100,000  epinephrine.  A left hemitransfixion incision was performed raising the  mucoperichondrial and mucoperiosteal flap.  The cartilage divided about 2 cm  posterior to the caudal strut and the deviated portion of the cartilage was  removed as well as the spur of the bony portion of the septum was removed  with Jansen-Middleton forceps. This seemed to correct the septal deflection.  The turbinates were infractured, midline incision made with a 15 blade,  mucosal flap elevated superiorly and the inferior mucosa and bone were  removed with a turbinate scissors.  The edge was cauterized with suction  cautery.  Both turbinates were outfractured and the flap was laid back down  over the raw surface.  Hemitransfixion incision closed with interrupted 4-0  chromic  and a 4-0 plain gut suture placed to the septum.  The  intercartilaginous incisions were then made bilaterally and flap was  elevated up on the superior aspect of the nose along the dorsum.  Periosteal  elevator was used to elevate the periosteum off the bone.  A rasp was then  used to make multiple passes off the across the bony dorsum which after each  pass the lateral view was checked.  Once the hump was rasped down to  satisfactory alignment from the glabella to the superonasal tip, it was  aborted.  There was a small amount of cartilaginous hump that was removed  with a finer rasp, just gently removing some of the soft tissue and it was  very minimal so no trimming needed to be done.  This looked very nice with  excellent contour and alignment of her nasal dorsum.  The Steri-Strips with  benzoin were placed over the dorsum.  Then an Aquaplast cast placed over the  dorsum.  The nasopharynx was suctioned  out of all blood and debris and Telfa  roll soaked in bacitracin was placed into the nose bilaterally and secured  with a 3-0 nylon.  Oral cavity and oropharynx was examined with a Crowe-  Davis mouth gag and the incision was made just above the base of the uvula  and carried into both anterior tonsillar pillars.  This was removed with  electrocautery.  The palate was checked.  This seemed to be an adequate  length of removal and still has plenty of closure of the palate.  The palate  incision was closed with interrupted 3-0 Vicryl.  The oral cavity was  irrigated with saline and all blood and debris were cleaned out. The CroweEarlene Plater was released and removed.  The patient was awakened and brought to  recovery in stable condition, counts correct.                                                Suzanna Obey, M.D.    Cordelia Pen  D:  11/04/2002  T:  11/04/2002  Job:  [id number]   cc:   Marcelyn Bruins, M.D. [facility]

## 2010-07-30 ENCOUNTER — Other Ambulatory Visit: Payer: Self-pay | Admitting: Pulmonary Disease

## 2010-08-16 ENCOUNTER — Telehealth: Payer: Self-pay | Admitting: Pulmonary Disease

## 2010-08-16 NOTE — Telephone Encounter (Signed)
Pt not seen in office since 01/2009.  Needs ov for refills.  LMOM TCB x1.

## 2010-08-17 NOTE — Telephone Encounter (Signed)
PT RETURNED CALL FROM NURSE. I SCHEDULED AN APPT FOR HER. HE WANTS TO KNOW IF SHE CAN GET ENOUGH MIRAPEX IN THE MEANTIME UNTIL APPT IN July (ON THE DAY SHE COULD COME IN). [facility]. [phone]. Christy [name]

## 2010-08-17 NOTE — Telephone Encounter (Signed)
Pt last seen 01/2009. She has scheduled an appt for 09-06-10 and is requesting a refill on mirapex 1mg  take 1/2 tab at bedtime to last until appt. Please advise if ok to send in refill. Carron Curie, CMA

## 2010-08-18 MED ORDER — PRAMIPEXOLE DIHYDROCHLORIDE 1 MG PO TABS: ORAL_TABLET | ORAL | Status: DC

## 2010-08-18 NOTE — Telephone Encounter (Signed)
Ok to call in #30, with no refills.

## 2010-08-18 NOTE — Telephone Encounter (Signed)
Refill sent. Pt aware.Jennifer Castillo, CMA  

## 2010-09-06 ENCOUNTER — Ambulatory Visit (INDEPENDENT_AMBULATORY_CARE_PROVIDER_SITE_OTHER): Payer: 59 | Admitting: Pulmonary Disease

## 2010-09-06 ENCOUNTER — Encounter: Payer: Self-pay | Admitting: Pulmonary Disease

## 2010-09-06 DIAGNOSIS — F518 Other sleep disorders not due to a substance or known physiological condition: Secondary | ICD-10-CM

## 2010-09-06 DIAGNOSIS — G2581 Restless legs syndrome: Secondary | ICD-10-CM

## 2010-09-06 MED ORDER — PRAMIPEXOLE DIHYDROCHLORIDE 1 MG PO TABS: ORAL_TABLET | ORAL | Status: DC

## 2010-09-06 NOTE — Progress Notes (Signed)
  Subjective:    Patient ID: Christy [name], female    DOB: [birth year], 55 y.o.   MRN: [id number]  HPI The pt comes in today for f/u of her known RLS.  She is taking mirapex, and feels her symptoms are under good control.  She denies any leg jerks or sleep disruption.  She still has a lot of anxiety issues, and is having persistent sleepiness during the day at times.     Review of Systems  Constitutional: Negative for fever and unexpected weight change.  HENT: Positive for congestion. Negative for ear pain, nosebleeds, sore throat, rhinorrhea, sneezing, trouble swallowing, dental problem, postnasal drip and sinus pressure.   Eyes: Positive for itching. Negative for redness.  Respiratory: Negative for cough, chest tightness, shortness of breath and wheezing.   Cardiovascular: Negative for palpitations and leg swelling.  Gastrointestinal: Negative for nausea and vomiting.  Genitourinary: Negative for dysuria.  Musculoskeletal: Negative for joint swelling.  Skin: Negative for rash.  Neurological: Positive for headaches.  Hematological: Does not bruise/bleed easily.  Psychiatric/Behavioral: Positive for dysphoric mood. The patient is nervous/anxious.        Objective:   Physical Exam Wd female in nad Nares without discharge or purulence LE without edema, no cyanosis Alert, does not appear sleepy, moves all 4        Assessment & Plan:

## 2010-09-06 NOTE — Assessment & Plan Note (Signed)
She continues to have issues with variable bedtimes, falling asleep on sofa, etc.  I have asked her to continue working on this.

## 2010-09-06 NOTE — Patient Instructions (Signed)
Will send in a prescription to your mail order pharmacy for your mirapex Continue to work on sleep hygiene as we discussed. followup with me in one year.

## 2010-09-06 NOTE — Assessment & Plan Note (Signed)
The pt is doing well from this standpoint with mirapex.  She still has some daytime sleepiness issues that I think are multifactorial.  She has significant sleep hygiene issues, has underlying psychiatric disease, and is taking meds that can cause sleepiness.  Will continue with her dopamine agonist while she works on these other issues.

## 2011-09-07 ENCOUNTER — Ambulatory Visit: Payer: 59 | Admitting: Pulmonary Disease

## 2012-02-06 ENCOUNTER — Ambulatory Visit: Payer: 59 | Admitting: Cardiovascular Disease

## 2012-04-07 ENCOUNTER — Telehealth: Payer: Self-pay | Admitting: Internal Medicine

## 2012-04-07 NOTE — Telephone Encounter (Signed)
Patient has been having symptoms x 2-3 weeks. Has not had any problems for 2 years until now. Seems to be worse the last few days especially the diarrhea. Offered OV with extender. Patient can only come on Wednesday or Friday's. Scheduled with Mike Gip, PA on 04/09/12 at 9:00 AM.

## 2012-04-09 ENCOUNTER — Encounter: Payer: Self-pay | Admitting: Physician Assistant

## 2012-04-09 ENCOUNTER — Telehealth: Payer: Self-pay | Admitting: *Deleted

## 2012-04-09 ENCOUNTER — Ambulatory Visit (INDEPENDENT_AMBULATORY_CARE_PROVIDER_SITE_OTHER): Payer: 59 | Admitting: Physician Assistant

## 2012-04-09 VITALS — BP 100/62 | HR 57 | Ht 68.0 in | Wt 150.0 lb

## 2012-04-09 DIAGNOSIS — K5120 Ulcerative (chronic) proctitis without complications: Secondary | ICD-10-CM

## 2012-04-09 DIAGNOSIS — K5190 Ulcerative colitis, unspecified, without complications: Secondary | ICD-10-CM

## 2012-04-09 MED ORDER — BUDESONIDE 9 MG PO TB24: 9.0000 mg | ORAL_TABLET | Freq: Every day | ORAL | Status: DC

## 2012-04-09 MED ORDER — HYDROCORTISONE ACE-PRAMOXINE 1-1 % RE FOAM: RECTAL | Status: DC

## 2012-04-09 MED ORDER — MESALAMINE 1000 MG RE SUPP: 1000.0000 mg | Freq: Every day | RECTAL | Status: DC

## 2012-04-09 NOTE — Progress Notes (Signed)
Subjective:    Patient ID: Christy [name], female    DOB: 1955/04/28, 57 y.o.   MRN: [id number]  HPI Christy [name] is a pleasant 57 year old white female known to Dr. Lina Sar who has history of ulcerative colitis which has manifested as ulcerative proctitis. She has not been seen in the past couple of years, and last had colonoscopy in January 2010. At that time she was noted to have active colitis in the rectosigmoid area and was treated with ProctoFoam. Biopsies from the right colon were negative as were biopsies from the left colon but rectal biopsies showed chronic active colitis. She also has family history of colon cancer. Her other medical problems include depression, hypothyroidism, migraines and GERD. She comes in today with 3 week history of diarrhea and urgency. She says she had fairly abrupt onset of symptoms but had not had any recent illnesses antibiotics or change in medications. At its worst she was having 5 or 6 bowel movements per day with significant urgency and lower abdominal  discomfort. She has not had any nausea vomiting or fever. She does complain of some bloating sensation. She was put on a [employer] which she's been doing over the past week and says she's actually does feel better and is now having one to 2 bowel movements per day but has started seeing some of bright red blood mixed with mucus in her stools. She also has been having some right lower quadrant discomfort over the past couple of weeks which has been fairly persistent.     Review of Systems  Constitutional: Negative.   HENT: Negative.   Eyes: Negative.   Respiratory: Negative.   Cardiovascular: Negative.   Gastrointestinal: Positive for abdominal pain, diarrhea and anal bleeding.  Endocrine: Negative.   Genitourinary: Negative.   Musculoskeletal: Negative.   Skin: Negative.   Allergic/Immunologic: Negative.   Neurological: Negative.   Hematological: Negative.   Psychiatric/Behavioral: Negative.     Outpatient Prescriptions Prior to Visit  Medication Sig Dispense Refill  . ALPRAZolam (XANAX) 1 MG tablet Take 1 mg by mouth at bedtime as needed. Take 1 1/2 tabs by mouth at bedtime       . Calcium Carbonate-Vitamin D 600-400 MG-UNIT per tablet Take 3 tablets by mouth.        . ergocalciferol (VITAMIN D2) 50000 UNITS capsule Take 50,000 Units by mouth once a week.       . estradiol (VIVELLE-DOT) 0.05 MG/24HR Place 1 patch onto the skin once a week. .0375MG Rosine Door      . levETIRAcetam (KEPPRA) 500 MG tablet Take 125 mg by mouth at bedtime.       Marland Kitchen levothyroxine (SYNTHROID, LEVOTHROID) 175 MCG tablet Take 175 mcg by mouth daily.        . sertraline (ZOLOFT) 50 MG tablet Take 1 1/2 tabs by mouth once daily.      Marland Kitchen spironolactone (ALDACTONE) 25 MG tablet Take 25 mg by mouth 2 (two) times daily.        . cyclobenzaprine (FLEXERIL) 10 MG tablet Take 10 mg by mouth at bedtime.        . mesalamine (CANASA) 1000 MG suppository Place 1,000 mg rectally as needed.       . mesalamine (LIALDA) 1.2 G EC tablet Take 1,200 mg by mouth as needed.       . pramipexole (MIRAPEX) 1 MG tablet Take 1/2 tablet at bedtime  90 tablet  3   No facility-administered medications prior to visit.   Allergies  Allergen Reactions  . Tetracycline     REACTION: diarrhea  . Tranxene (Clorazepate Dipotassium)     itche   Patient Active Problem List  Diagnosis  . COLONIC POLYPS  . HYPOTHYROIDISM  . VITAMIN D DEFICIENCY  . HYPERLIPIDEMIA  . INADEQUATE SLEEP HYGIENE  . RESTLESS LEG SYNDROME  . HYPERTENSION  . GERD  . ACUT PEPTC ULCR UNS SITE W/HEM W/O MENTION OBST  . ULCERATIVE PROCTITIS  . CONSTIPATION  . IBS  . RECTAL BLEEDING  . FIBROMYALGIA  . OSTEOPENIA  . HYPERSOMNIA  . DIARRHEA  . PERSONAL HX COLONIC POLYPS   History  Substance Use Topics  . Smoking status: Former Smoker -- 0.10 packs/day for 15 years    Types: Cigarettes    Quit date: 02/27/1980  . Smokeless tobacco: Not on file  . Alcohol Use:  Not on file   family history includes Breast cancer in an unspecified family member; Colon cancer in an unspecified family member; Diabetes in her father and mother; Kidney disease in her mother; Leukemia in her paternal grandfather; Lung cancer in her maternal grandfather; and Melanoma in her sister.     Objective:   Physical Exam well-developed white female in no acute distress, pleasant blood pressure 100/62 pulse 57 height 5 foot 8 weight 150. HEENT; nontraumatic normocephalic EOMI PERRLA sclera anicteric, Neck; supple no JVD, Cardiovascular ;regular rate and rhythm with S1-S2 no murmur or gallop, Pulmonary; clear bilaterally, Abdomen; soft she is tender in the left lower quadrant left mid quadrant and right lower quadrant no guarding or rebound no palpable mass or hepatosplenomegaly, Rectal; exam not done, Extremities; no clubbing cyanosis or edema skin warm and dry, Psych; mood and affect normal and appropriate.        Assessment & Plan:  #34 57 year old female with history of ulcerative proctitis which has been quiescent now with 3 week history of diarrhea, tenesmus, and lower abdominal discomfort with right and left lower quadrant tenderness on exam suggesting more proximal colonic involvement of her colitis. #2 history of colonic polyps/adenomatous #3 family history of colon cancer-last colonoscopy January 2010 due for 5 year interval followup  Plan; start  Uceris 9 mg by mouth daily and plan 6-8 week course-which was given samples and a prescription today Canasa suppositories at bedtime x2 weeks-prescription was prohibited lately extensive and gave her and off samples to get her by for 10 days. Followup with Dr. Lina Sar in 3-4 weeks, pt  is advised to call sooner for problems or worsening of sxs.

## 2012-04-09 NOTE — Telephone Encounter (Signed)
Patient actually came back up to our floor before leaving the building. [facility] called her to advise the copay for the Canasa Suppositories is $700.00.  The copay for the uceris was also very expensive.  She is going to use the samples we gave her for the Canasa and the Uceris.  I did advise she call back for an appointment with Dr. Juanda Chance in 3-4 weeks.

## 2012-04-09 NOTE — Patient Instructions (Addendum)
We sent prescriptions for Uceris and Canasa suppositories.  Take the Uceris samples first before picking up the prescription to make sure you tolerate it well.  Follow up with Dr. Juanda Chance in 3-4 weeks.

## 2012-04-09 NOTE — Progress Notes (Signed)
Reviewed and agree. Will likely add Mesalamine po  for presumed extended involvement as per  Physical exam.

## 2012-04-16 ENCOUNTER — Telehealth: Payer: Self-pay | Admitting: Internal Medicine

## 2012-04-16 MED ORDER — MESALAMINE 1000 MG RE SUPP: 1000.0000 mg | Freq: Every day | RECTAL | Status: DC

## 2012-04-16 MED ORDER — BUDESONIDE 9 MG PO TB24: 9.0000 mg | ORAL_TABLET | Freq: Every day | ORAL | Status: DC

## 2012-04-16 NOTE — Telephone Encounter (Signed)
Ok to give patient samples per Mike Gip, PA. Samples up front for pick up. Scheduled patient with Dr. Juanda Chance on 05/02/12 at 3:00 PM.

## 2012-04-17 ENCOUNTER — Encounter: Payer: Self-pay | Admitting: *Deleted

## 2012-04-29 ENCOUNTER — Ambulatory Visit: Payer: 59 | Admitting: Internal Medicine

## 2012-05-02 ENCOUNTER — Ambulatory Visit: Payer: 59 | Admitting: Internal Medicine

## 2012-05-05 ENCOUNTER — Telehealth: Payer: Self-pay | Admitting: *Deleted

## 2012-05-05 NOTE — Telephone Encounter (Signed)
Spoke with patient and rescheduled her with Mike Gip, PA on 05/09/12 at 1:30 PM.

## 2012-05-09 ENCOUNTER — Ambulatory Visit (INDEPENDENT_AMBULATORY_CARE_PROVIDER_SITE_OTHER): Payer: 59 | Admitting: Physician Assistant

## 2012-05-09 ENCOUNTER — Encounter: Payer: Self-pay | Admitting: Physician Assistant

## 2012-05-09 VITALS — BP 112/72 | HR 74 | Ht 68.0 in | Wt 151.0 lb

## 2012-05-09 DIAGNOSIS — K5120 Ulcerative (chronic) proctitis without complications: Secondary | ICD-10-CM

## 2012-05-09 MED ORDER — MESALAMINE 1.2 G PO TBEC: DELAYED_RELEASE_TABLET | ORAL | Status: DC

## 2012-05-09 NOTE — Progress Notes (Signed)
Reviewed and agree. An alternative would be Canasa supp maintenance regimen 2-3x/week. The cost of either medication may be the deciding factor.

## 2012-05-09 NOTE — Progress Notes (Signed)
Subjective:    Patient ID: Christy [name], female    DOB: 1955/04/20, 57 y.o.   MRN: [id number]  HPI [name] is a pleasant 57 year old white female known to Dr. Etheleen Mayhew has history of ulcerative proctitis. She was seen in the office about 3 weeks ago with an exacerbation, with diarrhea and tenesmus lower abdominal  pain and intermittent small-volume hematochezia. Her last colonoscopy was done in January of 2010 and biopsies of the left colon and right colon were negative rectal biopsies did show chronic active colitis. She was started on use there is 9 mg by mouth daily and Canasa suppositories at bedtime, and she comes back in today for office followup . He states she feels much better, that her diarrhea is gone and she is having one bowel movement per day which is occasionally still loose. He says she meal at may still have some urgency in the morning but once she has a bowel movement she signed for the rest of the day. She's not noticed any blood over the past week  She denies any abdominal pain or cramping. She stopped taking the Canasa suppositories about a week ago and stopped the use there is about 5 days ago because she has been feeling well. She admits that she does not like taking medications.    Review of Systems  Constitutional: Negative.   HENT: Negative.   Eyes: Negative.   Respiratory: Negative.   Cardiovascular: Negative.   Gastrointestinal: Negative.   Endocrine: Negative.   Genitourinary: Negative.   Allergic/Immunologic: Negative.   Neurological: Negative.   Hematological: Negative.   Psychiatric/Behavioral: Negative.    Outpatient Prescriptions Prior to Visit  Medication Sig Dispense Refill  . ALPRAZolam (XANAX) 1 MG tablet Take 1 mg by mouth at bedtime as needed. Take 1 1/2 tabs by mouth at bedtime       . Budesonide (UCERIS) 9 MG TB24 Take 9 mg by mouth daily.  18 tablet  0  . Calcium Carbonate-Vitamin D 600-400 MG-UNIT per tablet Take 3 tablets by mouth.         . ergocalciferol (VITAMIN D2) 50000 UNITS capsule Take 50,000 Units by mouth once a week.       . estradiol (VIVELLE-DOT) 0.05 MG/24HR Place 1 patch onto the skin once a week. .0375MG Rosine Door      . fish oil-omega-3 fatty acids 1000 MG capsule Take 2 g by mouth daily.      . lansoprazole (PREVACID) 30 MG capsule Take 30 mg by mouth as needed.       . levETIRAcetam (KEPPRA) 500 MG tablet Take 125 mg by mouth at bedtime.       Marland Kitchen levothyroxine (SYNTHROID, LEVOTHROID) 175 MCG tablet Take 150 mcg by mouth daily.       . Probiotic Product (ALIGN) 4 MG CAPS Take by mouth.      . sertraline (ZOLOFT) 50 MG tablet Take 1 1/2 tabs by mouth once daily.      Marland Kitchen spironolactone (ALDACTONE) 25 MG tablet Take 25 mg by mouth 2 (two) times daily.        . vitamin E 400 UNIT capsule Take 400 Units by mouth daily.      . mesalamine (CANASA) 1000 MG suppository Place 1 suppository (1,000 mg total) rectally at bedtime.  6 suppository  0  . hydrocortisone-pramoxine (PROCTOFOAM HC) rectal foam Use 1 application at bedtime.  10 g  1   No facility-administered medications prior to visit.   Allergies  Allergen Reactions  .  Oxycodone Itching  . Tetracycline     REACTION: diarrhea  . Tranxene (Clorazepate Dipotassium)     itche   Patient Active Problem List  Diagnosis  . COLONIC POLYPS  . HYPOTHYROIDISM  . VITAMIN D DEFICIENCY  . HYPERLIPIDEMIA  . INADEQUATE SLEEP HYGIENE  . RESTLESS LEG SYNDROME  . HYPERTENSION  . GERD  . ACUT PEPTC ULCR UNS SITE W/HEM W/O MENTION OBST  . ULCERATIVE PROCTITIS  . CONSTIPATION  . IBS  . RECTAL BLEEDING  . FIBROMYALGIA  . OSTEOPENIA  . HYPERSOMNIA  . DIARRHEA  . PERSONAL HX COLONIC POLYPS   History  Substance Use Topics  . Smoking status: Former Smoker -- 0.10 packs/day for 15 years    Types: Cigarettes    Quit date: 02/27/1980  . Smokeless tobacco: Never Used  . Alcohol Use: No   family history includes Breast cancer in some unspecified family members; Colon  cancer in an unspecified family member; Diabetes in her father and mother; Kidney disease in her mother; Leukemia in her paternal grandfather; Lung cancer in her maternal grandfather; and Melanoma in her sister.     Objective:   Physical Exam Well-developed white female in no acute distress, pleasant blood pressure 112 or 72 pulse 74. Not further examined today discussion only       Assessment & Plan:  #23 57 year old female with chronic ulcerative proctitis with recent exacerbation now improved. #2 colon neoplasia surveillance-due for followup colonoscopy 2015 #3 chronic GERD  Plan; discussed in importance of maintenance therapy for her proctitis. Will start Lialda 1.2 g by mouth twice daily-she was given a few weeks worth of samples, and emphasized the importance of continuing a medication to keep her symptoms under control. She will  followup with Dr. Lina Sar in 6 months or sooner when necessary, and will need followup colonoscopy in 2050

## 2012-05-09 NOTE — Patient Instructions (Addendum)
We sent prescription and have given you samples of Lialda. Take 1 tablet twice daily. Follow up with Dr. Juanda Chance in 6 months. Please call us if your symptoms flare up. We will get you an appointment.

## 2013-03-04 ENCOUNTER — Encounter: Payer: Self-pay | Admitting: Internal Medicine

## 2013-03-18 ENCOUNTER — Telehealth: Payer: Self-pay | Admitting: Internal Medicine

## 2013-03-18 NOTE — Telephone Encounter (Signed)
Chart reviewed. She actually  May have recall colon in 5 years, Jan 2015, due to ulcerative colitis.

## 2013-03-18 NOTE — Telephone Encounter (Signed)
Patient received a letter moving her colonoscopy recall to 02/2015. She is concerned about moving it out since she had a flare back in 04/2012. Please, advise.

## 2013-03-18 NOTE — Telephone Encounter (Signed)
Please, call patient and schedule recall colon now as per Dr. Olevia Perches.

## 2013-04-27 NOTE — Telephone Encounter (Signed)
Left vm to schedule appt. JD

## 2013-04-28 ENCOUNTER — Other Ambulatory Visit: Payer: Self-pay | Admitting: *Deleted

## 2013-04-28 ENCOUNTER — Telehealth: Payer: Self-pay | Admitting: Internal Medicine

## 2013-04-28 DIAGNOSIS — K5190 Ulcerative colitis, unspecified, without complications: Secondary | ICD-10-CM

## 2013-04-28 NOTE — Telephone Encounter (Signed)
OK to do on Mon May 25, 2013.

## 2013-04-28 NOTE — Telephone Encounter (Signed)
Patient is asking to have her procedure(screening colonoscopy ) on a Monday preferably 05/25/13. Dr. Olevia Perches, could you do this date at the hospital?

## 2013-04-28 NOTE — Telephone Encounter (Signed)
Spoke with patient and gave her appointment date and time. Scheduled Pre visit on 05/20/13 at 4:00 PM.

## 2013-04-28 NOTE — Telephone Encounter (Signed)
Spoke with patient and she would like to have the colonoscopy done by Dr. Olevia Perches on 05/25/13 at [facility]. Spoke with Sharee Pimple and scheduled patient on 05/25/13 at 10:30 AM for colonoscopy. Patient aware and will call me back for details and to schedule pre visit.

## 2013-04-29 ENCOUNTER — Encounter: Payer: Self-pay | Admitting: Internal Medicine

## 2013-05-20 ENCOUNTER — Ambulatory Visit (AMBULATORY_SURGERY_CENTER): Payer: Self-pay

## 2013-05-20 VITALS — Ht 68.0 in | Wt 152.0 lb

## 2013-05-20 DIAGNOSIS — Z860100 Personal history of colon polyps, unspecified: Secondary | ICD-10-CM

## 2013-05-20 MED ORDER — MOVIPREP 100 G PO SOLR: 1.0000 | Freq: Once | ORAL | Status: DC

## 2013-05-22 ENCOUNTER — Telehealth: Payer: Self-pay | Admitting: Internal Medicine

## 2013-05-22 NOTE — Telephone Encounter (Signed)
Spoke with patient and she asked if we had anything cheaper for prep.  I have a free moviprep for her, and she wanted "something cheaper."   She then said that she was waiting to talk to [facility] for some other info about the cost fo the colonoscopy.  I told her that we had a FREE coupon for her.  Then she mentioned cancelling for Monday.  She will talk with Rollene Fare and call us back with her decision.

## 2013-05-22 NOTE — Telephone Encounter (Signed)
Spoke with Rollene Fare concerning patient.   She said that she hadn't heard from the patient at all today.

## 2013-05-22 NOTE — Telephone Encounter (Signed)
Spoke with patient, and she said that she would"just finance" the colonoscopy.  She said that she did not want to take the free coupon.  Stated that she has a $10 re-bate that she got from [place] [employer].   She stated that she'd "feel guilty" about taking the free coupon from somebody who didn't have a job.  She said that she "could afford it really."   I again asked the patient if she wanted the free coupon and she declined the offer.   I again asked her if she wanted to cancel her Monday procedure, and she said, "No.".   I went over her prep instructions again with her.  She did not give me any feedback.  She is a hospital case.

## 2013-05-25 ENCOUNTER — Ambulatory Visit (HOSPITAL_COMMUNITY)
Admission: RE | Admit: 2013-05-25 | Discharge: 2013-05-25 | Disposition: A | Discharge status: Home / Self Care | Payer: BC Managed Care – PPO | Source: Ambulatory Visit | Attending: Internal Medicine | Admitting: Internal Medicine

## 2013-05-25 ENCOUNTER — Encounter (HOSPITAL_COMMUNITY): Payer: BC Managed Care – PPO | Admitting: Certified Registered Nurse Anesthetist

## 2013-05-25 ENCOUNTER — Ambulatory Visit (HOSPITAL_COMMUNITY): Payer: BC Managed Care – PPO | Admitting: Certified Registered Nurse Anesthetist

## 2013-05-25 ENCOUNTER — Encounter (HOSPITAL_COMMUNITY): Payer: Self-pay | Admitting: *Deleted

## 2013-05-25 ENCOUNTER — Encounter (HOSPITAL_COMMUNITY)
Admission: RE | Disposition: A | Discharge status: Home / Self Care | Payer: Self-pay | Source: Ambulatory Visit | Attending: Internal Medicine

## 2013-05-25 DIAGNOSIS — K5120 Ulcerative (chronic) proctitis without complications: Secondary | ICD-10-CM | POA: Insufficient documentation

## 2013-05-25 DIAGNOSIS — Z860100 Personal history of colon polyps, unspecified: Secondary | ICD-10-CM | POA: Insufficient documentation

## 2013-05-25 DIAGNOSIS — M899 Disorder of bone, unspecified: Secondary | ICD-10-CM | POA: Insufficient documentation

## 2013-05-25 DIAGNOSIS — M949 Disorder of cartilage, unspecified: Secondary | ICD-10-CM

## 2013-05-25 DIAGNOSIS — E559 Vitamin D deficiency, unspecified: Secondary | ICD-10-CM | POA: Insufficient documentation

## 2013-05-25 DIAGNOSIS — R197 Diarrhea, unspecified: Secondary | ICD-10-CM | POA: Insufficient documentation

## 2013-05-25 DIAGNOSIS — K5190 Ulcerative colitis, unspecified, without complications: Secondary | ICD-10-CM

## 2013-05-25 DIAGNOSIS — I10 Essential (primary) hypertension: Secondary | ICD-10-CM | POA: Insufficient documentation

## 2013-05-25 DIAGNOSIS — K219 Gastro-esophageal reflux disease without esophagitis: Secondary | ICD-10-CM | POA: Insufficient documentation

## 2013-05-25 DIAGNOSIS — F329 Major depressive disorder, single episode, unspecified: Secondary | ICD-10-CM | POA: Insufficient documentation

## 2013-05-25 DIAGNOSIS — R152 Fecal urgency: Secondary | ICD-10-CM | POA: Insufficient documentation

## 2013-05-25 DIAGNOSIS — K648 Other hemorrhoids: Secondary | ICD-10-CM | POA: Insufficient documentation

## 2013-05-25 DIAGNOSIS — IMO0001 Reserved for inherently not codable concepts without codable children: Secondary | ICD-10-CM | POA: Insufficient documentation

## 2013-05-25 DIAGNOSIS — E039 Hypothyroidism, unspecified: Secondary | ICD-10-CM | POA: Insufficient documentation

## 2013-05-25 DIAGNOSIS — Z87891 Personal history of nicotine dependence: Secondary | ICD-10-CM | POA: Insufficient documentation

## 2013-05-25 DIAGNOSIS — F3289 Other specified depressive episodes: Secondary | ICD-10-CM | POA: Insufficient documentation

## 2013-05-25 DIAGNOSIS — R109 Unspecified abdominal pain: Secondary | ICD-10-CM | POA: Insufficient documentation

## 2013-05-25 DIAGNOSIS — E785 Hyperlipidemia, unspecified: Secondary | ICD-10-CM | POA: Insufficient documentation

## 2013-05-25 DIAGNOSIS — Z85038 Personal history of other malignant neoplasm of large intestine: Secondary | ICD-10-CM | POA: Insufficient documentation

## 2013-05-25 DIAGNOSIS — G43909 Migraine, unspecified, not intractable, without status migrainosus: Secondary | ICD-10-CM | POA: Insufficient documentation

## 2013-05-25 DIAGNOSIS — Z79899 Other long term (current) drug therapy: Secondary | ICD-10-CM | POA: Insufficient documentation

## 2013-05-25 HISTORY — DX: Other specified postprocedural states: Z98.890

## 2013-05-25 HISTORY — PX: COLONOSCOPY: SHX5424

## 2013-05-25 HISTORY — DX: Other specified postprocedural states: R11.2

## 2013-05-25 SURGERY — COLONOSCOPY
Anesthesia: Monitor Anesthesia Care

## 2013-05-25 MED ORDER — PROPOFOL 10 MG/ML IV BOLUS
INTRAVENOUS | Status: DC | PRN
  Administered 2013-05-25: 100 mg via INTRAVENOUS
  Administered 2013-05-25: 40 mg via INTRAVENOUS
  Administered 2013-05-25: 200 mg via INTRAVENOUS
  Administered 2013-05-25: 100 mg via INTRAVENOUS
  Administered 2013-05-25: 200 mg via INTRAVENOUS

## 2013-05-25 MED ORDER — PROPOFOL 10 MG/ML IV BOLUS
INTRAVENOUS | Status: AC
  Filled 2013-05-25: qty 20

## 2013-05-25 MED ORDER — LACTATED RINGERS IV SOLN
INTRAVENOUS | Status: DC | PRN
  Administered 2013-05-25: 11:00:00 via INTRAVENOUS

## 2013-05-25 MED ORDER — FENTANYL CITRATE 0.05 MG/ML IJ SOLN
INTRAMUSCULAR | Status: AC
  Filled 2013-05-25: qty 2

## 2013-05-25 MED ORDER — SODIUM CHLORIDE 0.9 % IV SOLN
INTRAVENOUS | Status: DC
  Administered 2013-05-25: 500 mL via INTRAVENOUS

## 2013-05-25 NOTE — Transfer of Care (Signed)
Immediate Anesthesia Transfer of Care Note  Patient: Christy [name]  Procedure(s) Performed: Procedure(s): COLONOSCOPY (N/A)  Patient Location: PACU  Anesthesia Type:MAC  Level of Consciousness: awake and alert   Airway & Oxygen Therapy: Patient Spontanous Breathing  Post-op Assessment: Report given to PACU RN  Post vital signs: Reviewed and stable  Complications: No apparent anesthesia complications

## 2013-05-25 NOTE — Anesthesia Postprocedure Evaluation (Signed)
Anesthesia Post Note  Patient: Christy [name]  Procedure(s) Performed: Procedure(s) (LRB): COLONOSCOPY (N/A)  Anesthesia type: MAC  Patient location: PACU  Post pain: Pain level controlled  Post assessment: Post-op Vital signs reviewed  Last Vitals:  Filed Vitals:   05/25/13 1210  BP: 115/56  Pulse:   Temp:   Resp: 20    Post vital signs: Reviewed  Level of consciousness: sedated  Complications: No apparent anesthesia complications

## 2013-05-25 NOTE — Anesthesia Preprocedure Evaluation (Signed)
Anesthesia Evaluation  Patient identified by MRN, date of birth, ID band Patient awake    Reviewed: Allergy & Precautions, H&P , NPO status , Patient's Chart, lab work & pertinent test results  Airway Mallampati: II TM Distance: >3 FB Neck ROM: Full    Dental  (+) Teeth Intact, Dental Advisory Given   Pulmonary neg pulmonary ROS, former smoker,  breath sounds clear to auscultation  Pulmonary exam normal       Cardiovascular hypertension, negative cardio ROS  Rhythm:Regular Rate:Normal     Neuro/Psych negative neurological ROS  negative psych ROS   GI/Hepatic negative GI ROS, Neg liver ROS, GERD-  ,  Endo/Other  negative endocrine ROSHypothyroidism   Renal/GU negative Renal ROS  negative genitourinary   Musculoskeletal  (+) Fibromyalgia -  Abdominal   Peds negative pediatric ROS (+)  Hematology negative hematology ROS (+)   Anesthesia Other Findings   Reproductive/Obstetrics negative OB ROS                           Anesthesia Physical Anesthesia Plan  ASA: II  Anesthesia Plan: MAC   Post-op Pain Management:    Induction: Intravenous  Airway Management Planned: Simple Face Mask  Additional Equipment:   Intra-op Plan:   Post-operative Plan:   Informed Consent: I have reviewed the patients History and Physical, chart, labs and discussed the procedure including the risks, benefits and alternatives for the proposed anesthesia with the patient or authorized representative who has indicated his/her understanding and acceptance.   Dental advisory given  Plan Discussed with: CRNA  Anesthesia Plan Comments:         Anesthesia Quick Evaluation

## 2013-05-25 NOTE — Discharge Instructions (Signed)
Colonoscopy °Care After °These instructions give you information on caring for yourself after your procedure. Your doctor may also give you more specific instructions. Call your doctor if you have any problems or questions after your procedure. °HOME CARE °· Take it easy for the next 24 hours. °· Rest. °· Walk or use warm packs on your belly (abdomen) if you have belly cramping or gas. °· Do not drive for 24 hours. °· You may shower. °· Do not sign important papers or use machinery for 24 hours. °· Drink enough fluids to keep your pee (urine) clear or pale yellow. °· Resume your normal diet. Avoid heavy or fried foods. °· Avoid alcohol. °· Continue taking your normal medicines. °· Only take medicine as told by your doctor. Do not take aspirin. °If you had growths (polyps) removed: °· Do not take aspirin. °· Do not drink alcohol for 7 days or as told by your doctor. °· Eat a soft diet for 24 hours. °GET HELP RIGHT AWAY IF: °· You have a fever. °· You pass clumps of tissue (blood clots) or fill the toilet with blood. °· You have belly pain that gets worse and medicine does not help. °· Your belly is puffy (swollen). °· You feel sick to your stomach (nauseous) or throw up (vomit). °MAKE SURE YOU: °· Understand these instructions. °· Will watch your condition. °· Will get help right away if you are not doing well or get worse. °Document Released: 03/17/2010 Document Revised: 05/07/2011 Document Reviewed: 10/20/2012 °ExitCare® Patient Information ©2014 [employer]. ° °

## 2013-05-25 NOTE — H&P (Signed)
HPI Christy [name] is a pleasant 58 year old white female known to Dr. Delfin Edis who has history of ulcerative colitis which has manifested as ulcerative proctitis. She has not been seen in the past couple of years, and last had colonoscopy in January 2010. At that time she was noted to have active colitis in the rectosigmoid area and was treated with ProctoFoam. Biopsies from the right colon were negative as were biopsies from the left colon but rectal biopsies showed chronic active colitis.  She also has family history of colon cancer. Her other medical problems include depression, hypothyroidism, migraines and GERD.  She comes in today with 3 week history of diarrhea and urgency. She says she had fairly abrupt onset of symptoms but had not had any recent illnesses antibiotics or change in medications. At its worst she was having 5 or 6 bowel movements per day with significant urgency and lower abdominal discomfort. She has not had any nausea vomiting or fever. She does complain of some bloating sensation. She was put on a [employer] which she's been doing over the past week and says she's actually does feel better and is now having one to 2 bowel movements per day but has started seeing some of bright red blood mixed with mucus in her stools. She also has been having some right lower quadrant discomfort over the past couple of weeks which has been fairly persistent.  Review of Systems  Constitutional: Negative.  HENT: Negative.  Eyes: Negative.  Respiratory: Negative.  Cardiovascular: Negative.  Gastrointestinal: Positive for abdominal pain, diarrhea and anal bleeding.  Endocrine: Negative.  Genitourinary: Negative.  Musculoskeletal: Negative.  Skin: Negative.  Allergic/Immunologic: Negative.  Neurological: Negative.  Hematological: Negative.  Psychiatric/Behavioral: Negative.  Outpatient Prescriptions Prior to Visit   Medication  Sig  Dispense  Refill   .  ALPRAZolam (XANAX) 1 MG tablet  Take 1 mg  by mouth at bedtime as needed. Take 1 1/2 tabs by mouth at bedtime     .  Calcium Carbonate-Vitamin D 600-400 MG-UNIT per tablet  Take 3 tablets by mouth.     .  ergocalciferol (VITAMIN D2) 50000 UNITS capsule  Take 50,000 Units by mouth once a week.     .  estradiol (VIVELLE-DOT) 0.05 MG/24HR  Place 1 patch onto the skin once a week. .0375MG Nelda Severe     .  levETIRAcetam (KEPPRA) 500 MG tablet  Take 125 mg by mouth at bedtime.     Marland Kitchen  levothyroxine (SYNTHROID, LEVOTHROID) 175 MCG tablet  Take 175 mcg by mouth daily.     .  sertraline (ZOLOFT) 50 MG tablet  Take 1 1/2 tabs by mouth once daily.     Marland Kitchen  spironolactone (ALDACTONE) 25 MG tablet  Take 25 mg by mouth 2 (two) times daily.     .  cyclobenzaprine (FLEXERIL) 10 MG tablet  Take 10 mg by mouth at bedtime.     .  mesalamine (CANASA) 1000 MG suppository  Place 1,000 mg rectally as needed.     .  mesalamine (LIALDA) 1.2 G EC tablet  Take 1,200 mg by mouth as needed.     .  pramipexole (MIRAPEX) 1 MG tablet  Take 1/2 tablet at bedtime  90 tablet  3    No facility-administered medications prior to visit.    Allergies   Allergen  Reactions   .  Tetracycline      REACTION: diarrhea   .  Tranxene (Clorazepate Dipotassium)      itche  Patient Active Problem List   Diagnosis   .  COLONIC POLYPS   .  HYPOTHYROIDISM   .  VITAMIN D DEFICIENCY   .  HYPERLIPIDEMIA   .  INADEQUATE SLEEP HYGIENE   .  RESTLESS LEG SYNDROME   .  HYPERTENSION   .  GERD   .  ACUT PEPTC ULCR UNS SITE W/HEM W/O MENTION OBST   .  ULCERATIVE PROCTITIS   .  CONSTIPATION   .  IBS   .  RECTAL BLEEDING   .  FIBROMYALGIA   .  OSTEOPENIA   .  HYPERSOMNIA   .  DIARRHEA   .  PERSONAL HX COLONIC POLYPS    History   Substance Use Topics   .  Smoking status:  Former Smoker -- 0.10 packs/day for 15 years     Types:  Cigarettes     Quit date:  02/27/1980   .  Smokeless tobacco:  Not on file   .  Alcohol Use:  Not on file   family history includes Breast cancer in an  unspecified family member; Colon cancer in an unspecified family member; Diabetes in her father and mother; Kidney disease in her mother; Leukemia in her paternal grandfather; Lung cancer in her maternal grandfather; and Melanoma in her sister.  Objective:   Physical Exam well-developed white female in no acute distress, pleasant blood pressure 100/62 pulse 57 height 5 foot 8 weight 150. HEENT; nontraumatic normocephalic EOMI PERRLA sclera anicteric, Neck; supple no JVD, Cardiovascular ;regular rate and rhythm with S1-S2 no murmur or gallop, Pulmonary; clear bilaterally, Abdomen; soft she is tender in the left lower quadrant left mid quadrant and right lower quadrant no guarding or rebound no palpable mass or hepatosplenomegaly, Rectal; exam not done, Extremities; no clubbing cyanosis or edema skin warm and dry, Psych; mood and affect normal and appropriate.  Assessment & Plan:   #58 58 year old female with history of ulcerative proctitis which has been quiescent now with 3 week history of diarrhea, tenesmus, and lower abdominal discomfort with right and left lower quadrant tenderness on exam suggesting more proximal colonic involvement of her colitis.  #2 history of colonic polyps/adenomatous  #3 family history of colon cancer-last colonoscopy January 2010 due for 5 year interval followup  Plan; start Uceris 9 mg by mouth daily and plan 6-8 week course-which was given samples and a prescription today  Canasa suppositories at bedtime x2 weeks-prescription was prohibited lately extensive and gave her and off samples to get her by for 10 days.  Followup with Dr. Delfin Edis in 3-4 weeks, pt is advised to call sooner for problems or worsening of  sxs.

## 2013-05-25 NOTE — Op Note (Signed)
[facility] [place], [address]   COLONOSCOPY PROCEDURE REPORT  PATIENT: Christy, [name]  MR#: [id number] BIRTHDATE: 1955/10/18 , 57  yrs. old GENDER: Female ENDOSCOPIST: Lafayette Dragon, MD REFERRED EU:[id number] Forde Dandy, M.D. PROCEDURE DATE:  05/25/2013 PROCEDURE:   Colonoscopy with biopsy First Screening Colonoscopy - Avg.  risk and is 50 yrs.  old or older - No.  Prior Negative Screening - Now for repeat screening. N/A  History of Adenoma - Now for follow-up colonoscopy & has been > or = to 3 yrs.  N/A  Polyps Removed Today? No.  Recommend repeat exam, <10 yrs? Yes.  High risk (family or personal hx). ASA CLASS:   Class II INDICATIONS:High risk patient with previously diagnosed UC proctitis and last colon 2005 and 2010, last flare up 1 year ago, doing will on Lialda 1.2 gm/day. MEDICATIONS: MAC sedation, administered by CRNA  DESCRIPTION OF PROCEDURE:   After the risks benefits and alternatives of the procedure were thoroughly explained, informed consent was obtained.  A digital rectal exam revealed no abnormalities of the rectum.   The Pentax Ped Colon [id number] endoscope was introduced through the anus and advanced to the cecum, which was identified by both the appendix and ileocecal valve. No adverse events experienced.   The quality of the prep was excellent, using MoviPrep  The instrument was then slowly withdrawn as the colon was fully examined.      COLON FINDINGS: Small internal hemorrhoids were found.  Retroflexed views revealed no abnormalities. The time to cecum=5 minutes 24 seconds.  Withdrawal time=8 minutes 30 seconds.  The scope was withdrawn and the procedure completed. COMPLICATIONS: There were no complications.  ENDOSCOPIC IMPRESSION: Small internal hemorrhoids no evidence of active colitis biopsies from ascending, descending( 20-50cm) and restosigmoid ( 0-20 cm) taken  RECOMMENDATIONS: 1.  Await biopsy results 2.   Continue current medications 3. Recall colon 5 years  eSigned:  Lafayette Dragon, MD 05/25/2013 11:35 AM   cc:   PATIENT NAME:  Christy, [name] MR#: [id number]

## 2013-05-26 ENCOUNTER — Encounter (HOSPITAL_COMMUNITY): Payer: Self-pay | Admitting: Internal Medicine

## 2013-05-26 ENCOUNTER — Encounter: Payer: Self-pay | Admitting: Internal Medicine

## 2013-05-28 ENCOUNTER — Telehealth: Payer: Self-pay | Admitting: Internal Medicine

## 2013-05-28 NOTE — Telephone Encounter (Signed)
Advised patient to contact insurance company to find out which product they prefer she take in place of the [place]. She will call us back with that information and we will choose another medication at that time.

## 2013-06-01 ENCOUNTER — Telehealth: Payer: Self-pay | Admitting: Internal Medicine

## 2013-06-01 MED ORDER — SULFASALAZINE 500 MG PO TABS: ORAL_TABLET | ORAL | Status: DC

## 2013-06-01 MED ORDER — FOLIC ACID 1 MG PO TABS: 1.0000 mg | ORAL_TABLET | Freq: Every day | ORAL | Status: DC

## 2013-06-01 NOTE — Telephone Encounter (Signed)
I have spoken to Gantt @ the pharmacy. Sulfasalazine will cost patient $23.59 per month. Patient advised of this. She verbalizes understanding on how to take sulfasalazine as well as folic acid. She is also advised to finish taking the Lialda prior to taking sulfasalazine.

## 2013-06-01 NOTE — Telephone Encounter (Signed)
Dr Olevia Perches- Patient's insurance will not cover Lialda at a reasonable cost. I contacted Maudie Mercury at [facility]). I was told that Dorthy Cooler is a tier 4 medication. Asacol HD and Delzicol ar tier 3 medications. Colozal is not on formulary. Sulfasalazine is tier 1. Would it be okay to switch patient to sulfasalazine and folic acid?

## 2013-06-01 NOTE — Telephone Encounter (Signed)
Yes, please switch to Sulfasalazine 500mg , 2 po qd x 1 week then 2 po bid, #120,3 refills.  Folic acid 1mg , #100 1 po qd, she needs to finish the samples of Lialda  first.

## 2013-07-28 ENCOUNTER — Telehealth: Payer: Self-pay | Admitting: Internal Medicine

## 2013-07-28 NOTE — Telephone Encounter (Signed)
Left a message for patient to call me. 

## 2013-07-28 NOTE — Telephone Encounter (Signed)
Patient states the Sulfasalazine makes her have nausea. States she has only been taking 1 tablet BID. She is asking if she can try another drug that is cheaper that Lialda. (Per previous telephone note Asacol  HD and Delzicol are tier 3 on her insurance) Please, advise.

## 2013-07-28 NOTE — Telephone Encounter (Signed)
There is not likely to be anything cheaper. Try Apriso 375 mg, 2 po bid., #120

## 2013-07-29 MED ORDER — MESALAMINE ER 0.375 G PO CP24: ORAL_CAPSULE | ORAL | Status: DC

## 2013-07-29 NOTE — Telephone Encounter (Signed)
Patient notified of recommendation. Rx sent. 

## 2013-08-26 DIAGNOSIS — E0590 Thyrotoxicosis, unspecified without thyrotoxic crisis or storm: Secondary | ICD-10-CM

## 2013-08-26 HISTORY — DX: Thyrotoxicosis, unspecified without thyrotoxic crisis or storm: E05.90

## 2013-10-14 ENCOUNTER — Encounter: Payer: Self-pay | Admitting: Internal Medicine

## 2013-12-14 ENCOUNTER — Other Ambulatory Visit: Payer: Self-pay | Admitting: Internal Medicine

## 2013-12-23 ENCOUNTER — Other Ambulatory Visit: Payer: Self-pay | Admitting: Internal Medicine

## 2013-12-24 ENCOUNTER — Telehealth: Payer: Self-pay | Admitting: Internal Medicine

## 2013-12-24 NOTE — Telephone Encounter (Signed)
I have attempted to reach patient but voicemail does not indicate patient's name. I will call her back at a later time. Has she been taking Apriso? If so, where from? Last rx we sent was 07/2013.

## 2013-12-24 NOTE — Telephone Encounter (Signed)
Last appointm with Amy 04/2012, she was supposed to follow up in 6 months. Apriso needs to be taken at least 2/day in order to have any effect in preventing her proctitis. We can discuss it when I see her but until then she needs to take 2/day.

## 2013-12-24 NOTE — Telephone Encounter (Signed)
Patient states that script is written for Apriso 2 tablets twice daily. However, she is only taking Apriso 1 tablet daily and is not symptomatic. Is 1 tablet daily sufficient or does she need to increase her dose? She does have a follow up appointment with you in 01/2014.

## 2013-12-25 MED ORDER — MESALAMINE ER 0.375 G PO CP24: ORAL_CAPSULE | ORAL | Status: DC

## 2013-12-25 NOTE — Telephone Encounter (Signed)
Spoke to patient and gave Dr Nichola Sizer recommendations. She verbalizes understanding. Rx sent to pharmacy.

## 2013-12-25 NOTE — Telephone Encounter (Signed)
Left message for patient to call back  

## 2014-01-04 ENCOUNTER — Encounter: Payer: Self-pay | Admitting: *Deleted

## 2014-02-05 ENCOUNTER — Ambulatory Visit: Payer: BC Managed Care – PPO | Admitting: Internal Medicine

## 2014-03-19 ENCOUNTER — Ambulatory Visit: Payer: BC Managed Care – PPO | Admitting: Internal Medicine

## 2014-04-21 ENCOUNTER — Ambulatory Visit (INDEPENDENT_AMBULATORY_CARE_PROVIDER_SITE_OTHER): Payer: BLUE CROSS/BLUE SHIELD | Admitting: Internal Medicine

## 2014-04-21 ENCOUNTER — Encounter: Payer: Self-pay | Admitting: Internal Medicine

## 2014-04-21 VITALS — BP 110/60 | HR 68 | Ht 68.0 in | Wt 134.4 lb

## 2014-04-21 DIAGNOSIS — K5120 Ulcerative (chronic) proctitis without complications: Secondary | ICD-10-CM

## 2014-04-21 DIAGNOSIS — R110 Nausea: Secondary | ICD-10-CM

## 2014-04-21 MED ORDER — MESALAMINE ER 0.375 G PO CP24: ORAL_CAPSULE | ORAL | Status: DC

## 2014-04-21 NOTE — Progress Notes (Signed)
Christy [name] 1955-09-10 [id number]  Note: This dictation was prepared with Dragon digital system. Any transcriptional errors that result from this procedure are unintentional.   History of Present Illness: This is a 59 year old white female with ulcerative proctitis, here for refill of medications. Last flareup occurred in March 2014. She had a screening colonoscopy in March 2015 which showed completely normal left ,right and transverse colon with no evidence of inflammatory bowel disease. She discontinued Apriso .375 mg bid 6 months ago and has done well. She attributed nausea to the medications. She also describes an attack of  nausea and upper abdominal pain following a meal consisting of hamburger. There is a positive family history of gallbladder disease in her sister and in her mother    Past Medical History  Diagnosis Date  . Ulcerative proctitis   . Acute peptic ulcer, unspecified site, with hemorrhage, without mention of obstruction   . Tubular adenoma of colon   . Other specific disorder of sleep of nonorganic origin   . Unspecified hypothyroidism   . Obstructive sleep apnea (adult) (pediatric)   . Restless legs syndrome (RLS)   . Disorder of bone and cartilage, unspecified   . Vitamin D deficiency   . Hyperlipidemia   . Hypertension   . GERD (gastroesophageal reflux disease)   . Fibromyalgia   . PONV (postoperative nausea and vomiting)   . Internal hemorrhoids   . Hyperthyroidism 08/2013    Past Surgical History  Procedure Laterality Date  . Cesarean section    . Abdominal hysterectomy    . Knee arthroscopy Right     x 2  . Tonsillectomy and adenoidectomy    . Breast cyst aspiration Right   . Nasal septum surgery    . Laparoscopy      x 4  . Colonoscopy N/A 05/25/2013    Procedure: COLONOSCOPY;  Surgeon: Lafayette Dragon, MD;  Location: [facility];  Service: Endoscopy;  Laterality: N/A;    Allergies  Allergen Reactions  . Adhesive [Tape]   . Latex Hives  .  Oxycodone Itching  . Tetracycline     REACTION: diarrhea  . Tranxene [Clorazepate Dipotassium]     itche    Family history and social history have been reviewed.  Review of Systems: Occasional nausea. Denies vomiting. Positive for weight loss  The remainder of the 10 point ROS is negative except as outlined in the H&P  Physical Exam: General Appearance Well developed, in no distress Eyes  Non icteric  HEENT  Non traumatic, normocephalic  Mouth No lesion, tongue papillated, no cheilosis Neck Supple without adenopathy, thyroid not enlarged, no carotid bruits, no JVD Lungs Clear to auscultation bilaterally COR Normal S1, normal S2, regular rhythm, no murmur, quiet precordium Abdomen soft mildly tender in right upper quadrant. Liver edge at costal margin. No distention. Normoactive bowel sounds Rectal not done Extremities  No pedal edema Skin No lesions Neurological Alert and oriented x 3 Psychological Normal mood and affect  Assessment and Plan:   58 year old white female with ulcerative proctitis currently in remission. We will refill mesalamine  Episodes of nausea and food intolerance. Rule out symptomatic gallbladder disease. Positive family history of gallbladder disease in 2 direct relatives. We will proceed with upper abdominal ultrasound    Delfin Edis 04/21/2014

## 2014-04-21 NOTE — Patient Instructions (Addendum)
You have been scheduled for an abdominal ultrasound at [facility] (1st floor of hospital) on 04-28-14 at 9:00am. Please arrive 15 minutes prior to your appointment for registration. Make certain not to have anything to eat or drink 6 hours prior to your appointment. Should you need to reschedule your appointment, please contact radiology at [phone]. This test typically takes about 30 minutes to perform.  We have sent the following medications to your pharmacy for you to pick up at your convenience: Apriso Dr Carrolyn Meiers

## 2014-04-28 ENCOUNTER — Ambulatory Visit (HOSPITAL_COMMUNITY)
Admission: RE | Admit: 2014-04-28 | Discharge: 2014-04-28 | Disposition: A | Discharge status: Home / Self Care | Payer: BLUE CROSS/BLUE SHIELD | Source: Ambulatory Visit | Attending: Internal Medicine | Admitting: Internal Medicine

## 2014-04-28 DIAGNOSIS — R112 Nausea with vomiting, unspecified: Secondary | ICD-10-CM | POA: Diagnosis not present

## 2014-04-28 DIAGNOSIS — R110 Nausea: Secondary | ICD-10-CM

## 2014-04-29 ENCOUNTER — Other Ambulatory Visit: Payer: Self-pay

## 2014-04-29 MED ORDER — OMEPRAZOLE 20 MG PO CPDR: 20.0000 mg | DELAYED_RELEASE_CAPSULE | Freq: Every day | ORAL | Status: DC

## 2016-02-13 IMAGING — US US ABDOMEN COMPLETE
1 series · 14 of 25 positions shown · non-contrast
Comparison: None.

CLINICAL DATA: Nausea and vomiting.

EXAM:
ULTRASOUND ABDOMEN COMPLETE

[Series 1: us abdomen complete · 0.15mm/px · 14 of 76 slices shown]
[im 1/76]
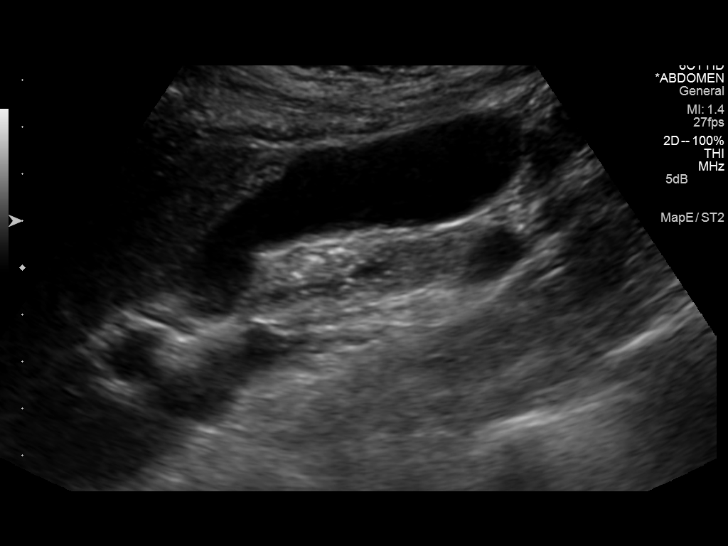
[im 7/76]
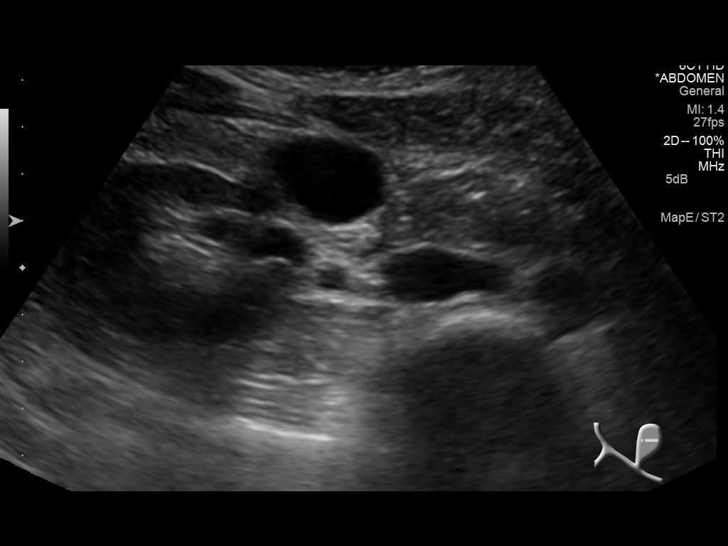
[im 13/76]
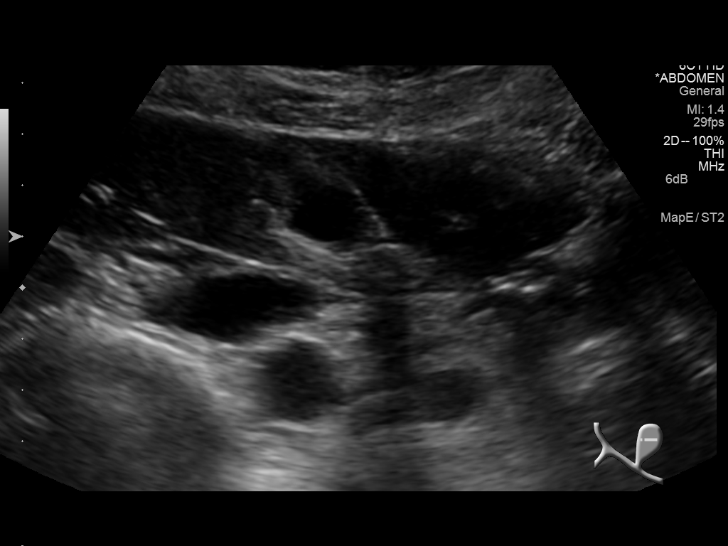
[im 19/76]
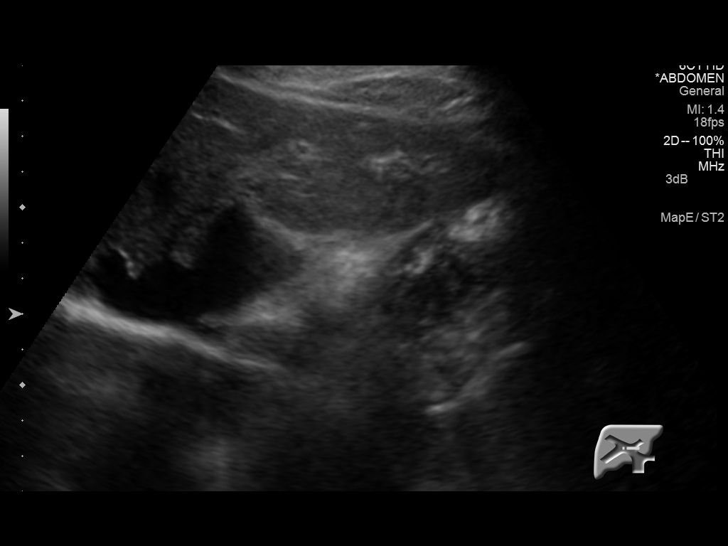
[im 26/76]
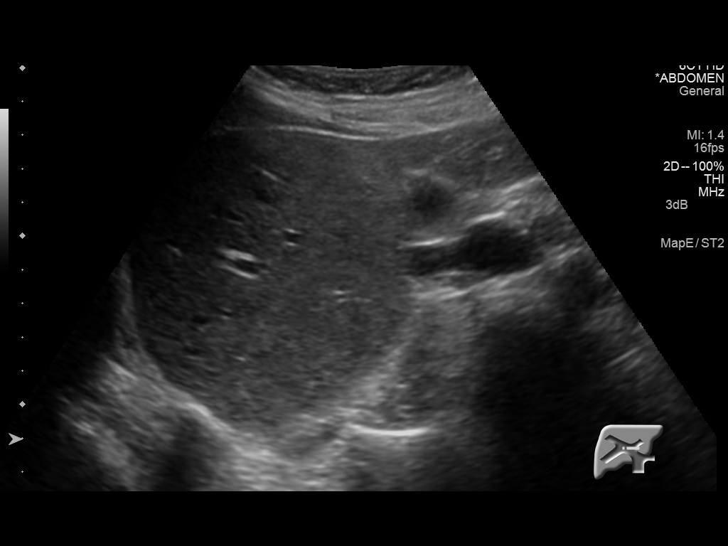
[im 29/76]
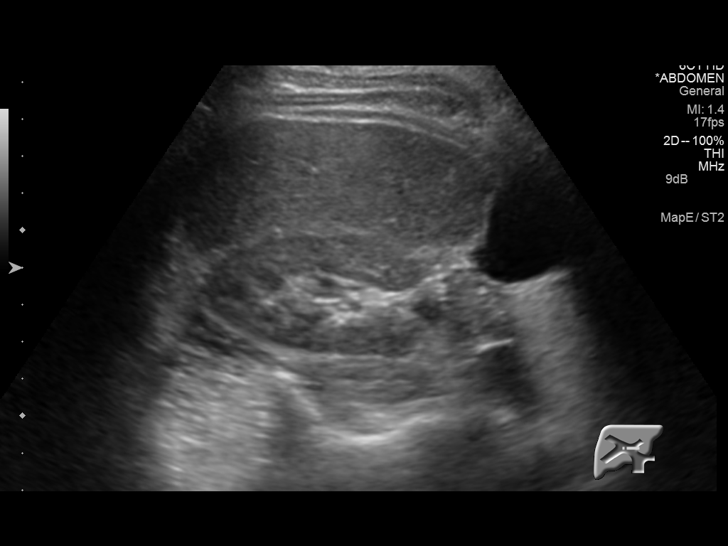
[im 35/76]
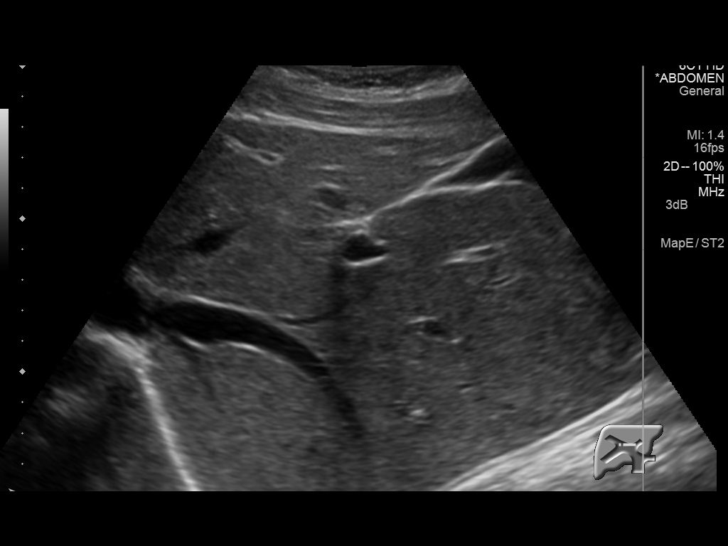
[im 41/76]
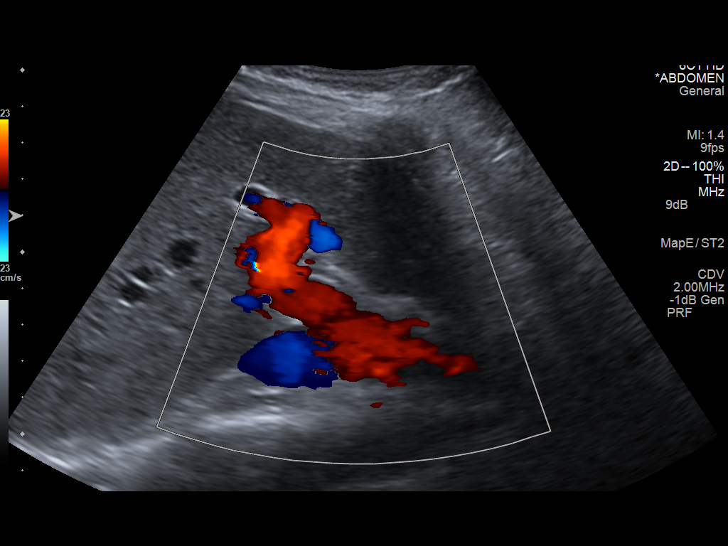
[im 47/76]
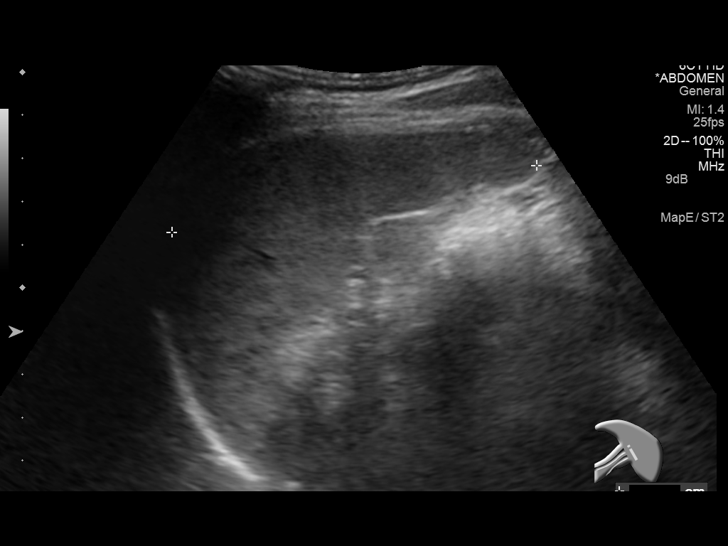
[im 51/76]
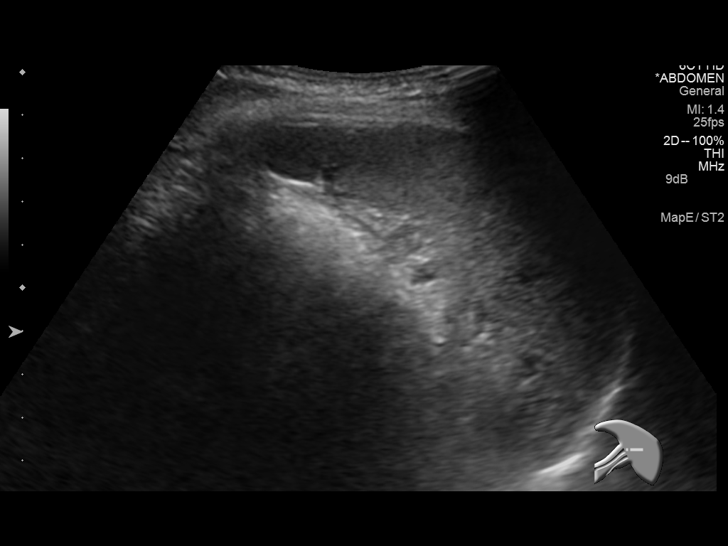
[im 57/76]
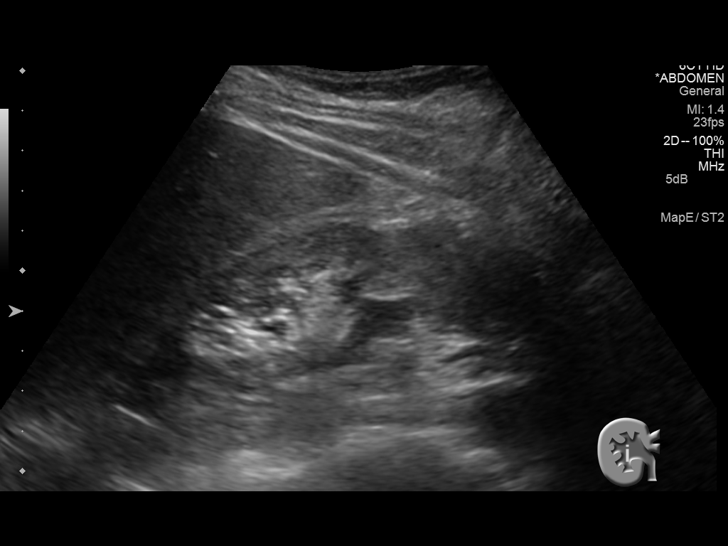
[im 63/76]
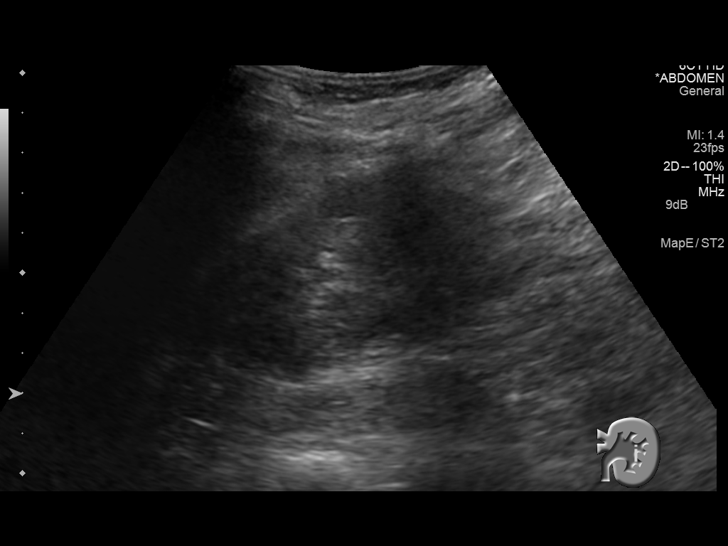
[im 69/76]
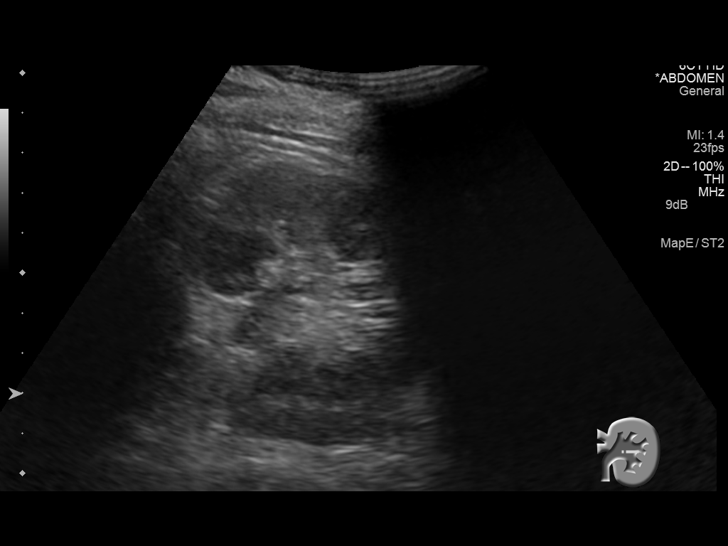
[im 76/76]
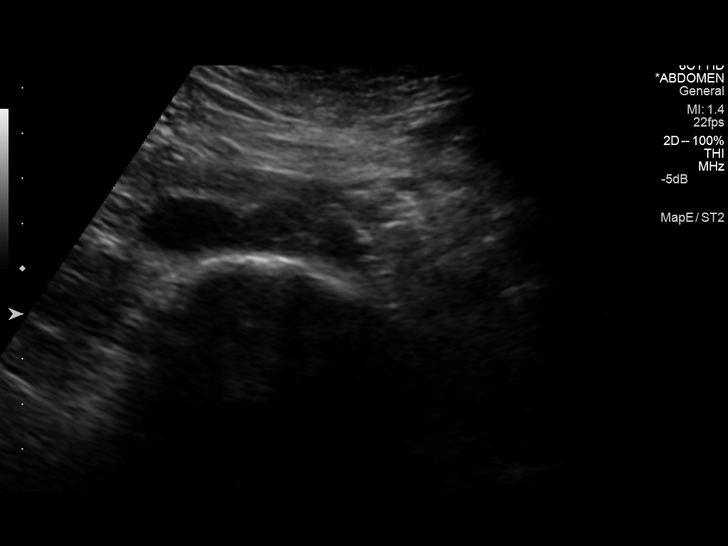

[14 of 25 positions shown; findings below may reference images not displayed]

FINDINGS: Gallbladder: No gallstones or wall thickening visualized. No
sonographic Murphy sign noted.

Common bile duct: Diameter: 2.9 mm

Liver: No focal lesion identified. Within normal limits in
parenchymal echogenicity.

IVC: No abnormality visualized.

Pancreas: Visualized portion unremarkable.

Spleen: Size and appearance within normal limits.

Right Kidney: Length: 11.0 cm. Echogenicity within normal limits. No
mass or hydronephrosis visualized.

Left Kidney: Length: 12.0 cm. Echogenicity within normal limits. No
mass or hydronephrosis visualized.

Abdominal aorta: No aneurysm visualized.

Other findings: None.
IMPRESSION: Normal exam.

## 2016-02-27 DIAGNOSIS — N760 Acute vaginitis: Secondary | ICD-10-CM

## 2016-02-27 HISTORY — DX: Acute vaginitis: N76.0

## 2016-04-16 ENCOUNTER — Telehealth: Payer: Self-pay | Admitting: Physician Assistant

## 2016-04-16 NOTE — Telephone Encounter (Signed)
Patient understands she must be seen first. Her work keeps her out of town 3 days a week, but she works from home Wednesday and Friday. Her appointment is on Friday this week 04/20/16.

## 2016-04-18 ENCOUNTER — Telehealth: Payer: Self-pay | Admitting: Physician Assistant

## 2016-04-18 NOTE — Telephone Encounter (Signed)
Moved the patient to tomorrow's schedule

## 2016-04-19 ENCOUNTER — Encounter (HOSPITAL_COMMUNITY): Payer: Self-pay

## 2016-04-19 ENCOUNTER — Ambulatory Visit (INDEPENDENT_AMBULATORY_CARE_PROVIDER_SITE_OTHER): Payer: 59 | Admitting: Gastroenterology

## 2016-04-19 ENCOUNTER — Ambulatory Visit (HOSPITAL_COMMUNITY): Admit: 2016-04-19 | Payer: 59 | Admitting: Gastroenterology

## 2016-04-19 ENCOUNTER — Encounter: Payer: Self-pay | Admitting: Gastroenterology

## 2016-04-19 VITALS — BP 104/70 | HR 60 | Ht 67.5 in | Wt 137.0 lb

## 2016-04-19 DIAGNOSIS — K625 Hemorrhage of anus and rectum: Secondary | ICD-10-CM

## 2016-04-19 DIAGNOSIS — K5900 Constipation, unspecified: Secondary | ICD-10-CM | POA: Diagnosis not present

## 2016-04-19 SURGERY — MANOMETRY, ANORECTAL

## 2016-04-19 MED ORDER — HYDROCORTISONE ACETATE 25 MG RE SUPP: 25.0000 mg | Freq: Every day | RECTAL | 0 refills | Status: DC

## 2016-04-19 NOTE — Patient Instructions (Signed)
You have been scheduled to have an anorectal manometry at [facility]oscopy on ____ at _____. Please arrive 30 minutes prior to your appointment time for registration (1st floor of the hospital-admissions).  Please make certain to use 1 Fleets enema 2 hours prior to coming for your appointment. You can purchase Fleets enemas from the laxative section at your drug store. You should not eat anything during the two hours prior to the procedure. You may take regular medications with small sips of water at least 2 hours prior to the study.  Anorectal manometry is a test performed to evaluate patients with constipation or fecal incontinence. This test measures the pressures of the anal sphincter muscles, the sensation in the rectum, and the neural reflexes that are needed for normal bowel movements.  THE PROCEDURE The test takes approximately 30 minutes to 1 hour. You will be asked to change into a hospital gown. A technician or nurse will explain the procedure to you, take a brief health history, and answer any questions you may have. The patient then lies on his or her left side. A small, flexible tube, about the size of a thermometer, with a balloon at the end is inserted into the rectum. The catheter is connected to a machine that measures the pressure. During the test, the small balloon attached to the catheter may be inflated in the rectum to assess the normal reflex pathways. The nurse or technician may also ask the person to squeeze, relax, and push at various times. The anal sphincter muscle pressures are measured during each of these maneuvers. To squeeze, the patient tightens the sphincter muscles as if trying to prevent anything from coming out. To push or bear down, the patient strains down as if trying to have a bowel movement.   We will call you back with this appointment   Increase fluid intake to 8-10 cups per day  Take benefiber 1 tablspoon three times a day  We will send your  prescription of suppositories to your pharmacy

## 2016-04-19 NOTE — Progress Notes (Signed)
Christy [name]    NO:[id number]    1955-11-06  Primary Care Physician:SOUTH,STEPHEN Antony Haste, MD  Referring Physician: Reynold Bowen, MD [address] [place], [place] [address]  Chief complaint:  Mucus/blood discharge rectum  HPI: 61 year old female previously followed by Dr. Olevia Perches is here for follow-up visit with complaints of mucus discharge with blood per rectum intermittently for the past 2 months. She was last seen in office in February 2016. She was thought to have ulcerative proctitis on colonoscopy in Jan 2010. Cipro and colonoscopy in March 2015 with random colon biopsies did not show any evidence of ulcerative colitis or proctitis. She was on Apriso 375 mg for a few months and discontinued it about 2 years ago. She was noted to be vitamin D deficient by her primary care physician Dr. Forde Dandy and was started on vitamin D supplements as well as calcium, she got constipated with both vitamin D and calcium and has been straining excessively with bowel movements. She did stop taking calcium but continues to have constipation with incomplete evacuation and bloating   Outpatient Encounter Prescriptions as of 04/19/2016  Medication Sig  . ALPRAZolam (XANAX) 1 MG tablet Take 1 mg by mouth at bedtime as needed. Take 1 1/2 tabs by mouth at bedtime   . fish oil-omega-3 fatty acids 1000 MG capsule Take 2 g by mouth daily.  . Probiotic Product (ALIGN) 4 MG CAPS Take by mouth.  . spironolactone (ALDACTONE) 25 MG tablet Take 25 mg by mouth daily.   Marland Kitchen SYNTHROID 150 MCG tablet Take 150 mcg by mouth daily.  . [DISCONTINUED] estradiol (CLIMARA - DOSED IN MG/24 HR) 0.0375 mg/24hr patch Place 0.0375 mg onto the skin once a week. Pt cuts patch in half  . [DISCONTINUED] mesalamine (APRISO) 0.375 G 24 hr capsule Take 1 tablets by mouth twice a day  . [DISCONTINUED] omeprazole (PRILOSEC) 20 MG capsule Take 1 capsule (20 mg total) by mouth daily.   No facility-administered encounter medications on  file as of 04/19/2016.     Allergies as of 04/19/2016 - Review Complete 04/19/2016  Allergen Reaction Noted  . Adhesive [tape]  05/20/2013  . Latex Hives 05/20/2013  . Oxycodone Itching 05/09/2012  . Tetracycline    . Tranxene [clorazepate dipotassium]  09/06/2010    Past Medical History:  Diagnosis Date  . Acute peptic ulcer, unspecified site, with hemorrhage, without mention of obstruction   . Disorder of bone and cartilage, unspecified   . Fibromyalgia   . GERD (gastroesophageal reflux disease)   . Hyperlipidemia   . Hypertension   . Hyperthyroidism 08/2013  . Internal hemorrhoids   . Obstructive sleep apnea (adult) (pediatric)   . Other specific disorder of sleep of nonorganic origin   . PONV (postoperative nausea and vomiting)   . Restless legs syndrome (RLS)   . Tubular adenoma of colon   . Ulcerative proctitis   . Unspecified hypothyroidism   . Vitamin D deficiency     Past Surgical History:  Procedure Laterality Date  . ABDOMINAL HYSTERECTOMY    . BREAST CYST ASPIRATION Right   . CESAREAN SECTION    . COLONOSCOPY N/A 05/25/2013   Procedure: COLONOSCOPY;  Surgeon: Lafayette Dragon, MD;  Location: [facility];  Service: Endoscopy;  Laterality: N/A;  . KNEE ARTHROSCOPY Right    x 2  . LAPAROSCOPY     x 4  . NASAL SEPTUM SURGERY    . TONSILLECTOMY AND ADENOIDECTOMY  Family History  Problem Relation Age of Onset  . Colon cancer      grandmother  . Breast cancer    . Lung cancer Maternal Grandfather   . Kidney disease Mother   . Diabetes Mother   . Diabetes Father   . Melanoma Sister   . Leukemia Paternal Grandfather   . Breast cancer      aunt    Social History   Social History  . Marital status: Married    Spouse name: N/A  . Number of children: N/A  . Years of education: N/A   Occupational History  . Personal assistant    Social History Main Topics  . Smoking status: Former Smoker    Packs/day: 0.10    Years: 15.00    Types: Cigarettes      Quit date: 02/27/1980  . Smokeless tobacco: Never Used  . Alcohol use No  . Drug use: No  . Sexual activity: Not on file   Other Topics Concern  . Not on file   Social History Narrative  . No narrative on file      Review of systems: Review of Systems  Constitutional: Negative for fever and chills.  HENT: Negative.   Eyes: Negative for blurred vision.  Respiratory: Negative for cough, shortness of breath and wheezing.   Cardiovascular: Negative for chest pain and palpitations.  Gastrointestinal: as per HPI Genitourinary: Negative for dysuria, urgency, frequency and hematuria.  Musculoskeletal: Negative for myalgias, back pain and joint pain.  Skin: Negative for itching and rash.  Neurological: Negative for dizziness, tremors, focal weakness, seizures and loss of consciousness.  Endo/Heme/Allergies: Positive for seasonal allergies.  Psychiatric/Behavioral: Negative for depression, suicidal ideas and hallucinations.  All other systems reviewed and are negative.   Physical Exam: Vitals:   04/19/16 0823  BP: 104/70  Pulse: 60   Body mass index is 21.14 kg/m. Gen:      No acute distress HEENT:  EOMI, sclera anicteric Neck:     No masses; no thyromegaly Lungs:    Clear to auscultation bilaterally; normal respiratory effort CV:         Regular rate and rhythm; no murmurs Abd:      + bowel sounds; soft, non-tender; no palpable masses, no distension Ext:    No edema; adequate peripheral perfusion Skin:      Warm and dry; no rash Neuro: alert and oriented x 3 Psych: normal mood and affect Rectal exam: Normal anal sphincter tone, no anal fissure or external hemorrhoids Anoscopy: Small internal hemorrhoids, no active bleeding, normal dentate line, no visible nodules  Data Reviewed:  Reviewed labs, radiology imaging, old records and pertinent past GI work up   Assessment and Plan/Recommendations: 61 year old female here with complaints of worsening constipation and  intermittent bloody mucus discharge per rectum when she wipes after a bowel movement blood per rectum likely secondary to internal hemorrhoids with worsening constipation and excessive straining. No evidence of ulcerative colitis or proctitis based on last colonoscopy in 2015 Anusol suppository per rectum at bedtime for 7-10 days If continues to have persistent symptoms may consider flexible sigmoidoscopy to exclude ulcerative proctitis and also consider hemorrhoidal band ligation if needed  Constipation with outlet dysfunction: We will schedule for anorectal manometry to evaluate, if has evidence of dyssynergia defecation refer to pelvic floor physical therapy for biofeedback Advised patient to start taking Benefiber 1 tablespoon 3 times daily Please fluid intake to 8-10 cups daily  25 minutes was spent face-to-face with the  patient. Greater than 50% of the time used for counseling as well as treatment plan and follow-up. She had multiple questions which were answered to her satisfaction  K. Denzil Magnuson , MD [phone] Mon-Fri 8a-5p [phone] after 5p, weekends, holidays  CC: Reynold Bowen, MD

## 2016-04-20 ENCOUNTER — Ambulatory Visit: Payer: BLUE CROSS/BLUE SHIELD | Admitting: Physician Assistant

## 2016-04-25 ENCOUNTER — Ambulatory Visit: Payer: BLUE CROSS/BLUE SHIELD | Admitting: Physician Assistant

## 2016-04-27 ENCOUNTER — Other Ambulatory Visit: Payer: Self-pay | Admitting: *Deleted

## 2016-04-27 ENCOUNTER — Telehealth: Payer: Self-pay | Admitting: *Deleted

## 2016-04-27 DIAGNOSIS — K5902 Outlet dysfunction constipation: Secondary | ICD-10-CM

## 2016-04-27 NOTE — Telephone Encounter (Signed)
Called patient to inform when her Anorectal Virgel Gess is date and time of test Patient has concerns with cost   Dr Silverio Decamp, Patient stated she has totally stopped taking Vitamin D but she is still seeing blood with bowel movements  After 7 days of using the suppositories.  Not sure suppositories is helping.   Patients Anorectal mano is scheduled for 05/18/2016  Patient states she has not been taking benefiber at all. She cant understand why she would take benefiber if she is constipated when benfiber is used to bulk up the stool.   Dr Silverio Decamp please advise

## 2016-04-30 NOTE — Telephone Encounter (Signed)
Benefiber is used for constipation and also at times for fecal incontinence. Please advise her to take it three times daily with meals.

## 2016-05-01 ENCOUNTER — Telehealth: Payer: Self-pay | Admitting: Gastroenterology

## 2016-05-01 ENCOUNTER — Other Ambulatory Visit: Payer: Self-pay | Admitting: *Deleted

## 2016-05-01 DIAGNOSIS — Z8719 Personal history of other diseases of the digestive system: Secondary | ICD-10-CM

## 2016-05-01 DIAGNOSIS — R195 Other fecal abnormalities: Secondary | ICD-10-CM

## 2016-05-01 DIAGNOSIS — K625 Hemorrhage of anus and rectum: Secondary | ICD-10-CM

## 2016-05-01 MED ORDER — MESALAMINE 1000 MG RE SUPP: 1000.0000 mg | Freq: Two times a day (BID) | RECTAL | 0 refills | Status: DC

## 2016-05-01 NOTE — Telephone Encounter (Signed)
Patient scheduled for Flex sig on Friday 05/04/2016 at 4 pm Will sign acknowledgement and consent forms the day of her procedure.. Received instructions in My Chart-

## 2016-05-01 NOTE — Telephone Encounter (Signed)
Patient has h/o ulcerative proctitis, will need flex sig to evaluate if she is having any flare of proctitis vs hemorrhoidal bleeding. Further management based on findings of flex sig. Thanks

## 2016-05-01 NOTE — Telephone Encounter (Signed)
Pt is returning to call your back  #[phone]

## 2016-05-01 NOTE — Telephone Encounter (Signed)
She has constipation under control but is now having mucus in her stool in the toilet and bleeding .Marland Kitchen Bowel movements are not hard to pass.....   Patient states she is not eating any lettuce or raw veggies, She is on a soft food diet ..........Marland Kitchen  And is not really having any fecal incon issues now...... Has completed round of hydrocortisone suppositories ...  Has not helped  Dr Silverio Decamp Please advise

## 2016-05-01 NOTE — Telephone Encounter (Signed)
See the other phone note 

## 2016-05-02 ENCOUNTER — Telehealth: Payer: Self-pay | Admitting: Gastroenterology

## 2016-05-02 NOTE — Telephone Encounter (Signed)
Dr Silverio Decamp, You did tell me canasa twice daily right? Pharmacy calling wants to make sure the dose is correct , they are saying its over the normal ...Marland KitchenMarland KitchenMarland Kitchen

## 2016-05-02 NOTE — Telephone Encounter (Signed)
Canasa at bedtime daily. Thanks

## 2016-05-03 ENCOUNTER — Telehealth: Payer: Self-pay | Admitting: Gastroenterology

## 2016-05-03 NOTE — Telephone Encounter (Signed)
Returned pharmacys call  One at bedtime

## 2016-05-03 NOTE — Telephone Encounter (Signed)
Christy [name] will be more affordable than previously. The pharmacist has a coupon for her that should make the co-pay $30. She is scheduled for the flex-sig tomorrow. She will pick up the Rx after that just in case there is any change of the treatment plan.

## 2016-05-04 ENCOUNTER — Ambulatory Visit (AMBULATORY_SURGERY_CENTER): Payer: 59 | Admitting: Gastroenterology

## 2016-05-04 ENCOUNTER — Encounter: Payer: Self-pay | Admitting: Gastroenterology

## 2016-05-04 VITALS — BP 100/49 | HR 60 | Temp 98.9°F | Resp 14 | Ht 67.0 in | Wt 137.0 lb

## 2016-05-04 DIAGNOSIS — K625 Hemorrhage of anus and rectum: Secondary | ICD-10-CM | POA: Diagnosis present

## 2016-05-04 DIAGNOSIS — K529 Noninfective gastroenteritis and colitis, unspecified: Secondary | ICD-10-CM | POA: Diagnosis not present

## 2016-05-04 MED ORDER — SODIUM CHLORIDE 0.9 % IV SOLN: 500.0000 mL | INTRAVENOUS | Status: DC

## 2016-05-04 MED ORDER — MESALAMINE 1.2 G PO TBEC: 4.8000 g | DELAYED_RELEASE_TABLET | Freq: Every day | ORAL | 3 refills | Status: DC

## 2016-05-04 NOTE — Op Note (Signed)
[place] Patient Name: Christy [name] Procedure Date: 05/04/2016 4:17 PM MRN: [id number] Endoscopist: Mauri Pole , MD Age: 61 Referring MD:  Date of Birth: 1955-04-17 Gender: Female Account #: [id number] Procedure:                Flexible Sigmoidoscopy Indications:              Hematochezia, Disease activity assessment of                            chronic ulcerative proctosigmoiditis Medicines:                Monitored Anesthesia Care Procedure:                Pre-Anesthesia Assessment:                           - Prior to the procedure, a History and Physical                            was performed, and patient medications and                            allergies were reviewed. The patient's tolerance of                            previous anesthesia was also reviewed. The risks                            and benefits of the procedure and the sedation                            options and risks were discussed with the patient.                            All questions were answered, and informed consent                            was obtained. Prior Anticoagulants: The patient has                            taken no previous anticoagulant or antiplatelet                            agents. ASA Grade Assessment: II - A patient with                            mild systemic disease. After reviewing the risks                            and benefits, the patient was deemed in                            satisfactory condition to undergo the procedure.  After obtaining informed consent, the scope was                            passed under direct vision. The Model PCF-H190DL                            [id number]) scope was introduced through the anus                            and advanced to the the splenic flexure. The                            flexible sigmoidoscopy was accomplished without                            difficulty. The patient  tolerated the procedure                            well. The quality of the bowel preparation was good. Scope In: 4:22:29 PM Scope Out: 4:30:30 PM Total Procedure Duration: 0 hours 8 minutes 1 second  Findings:                 The perianal and digital rectal examinations were                            normal.                           A continuous area of nonbleeding congested                            ulcerated mucosa with stigmata of recent bleeding,                            erythema and altered vascularity was present in the                            rectum; from anal verge to 20cm Biopsies were taken                            with a cold forceps for histology.                           The exam was otherwise without abnormality, rest of                            the visualized colon mucosa appeared normal. Complications:            No immediate complications. Estimated Blood Loss:     Estimated blood loss was minimal. Impression:               - Mucosal ulceration. Biopsied.                           - The examination was otherwise normal. Recommendation:           -  Resume previous diet.                           - Continue present medications.                           - Canasa at bedtime daily x 2 weeks                           - Lialda 4.8gm daily x 30 days with 3 refills                           - Await pathology results.                           - Return to GI office in 2 weeks. Mauri Pole, MD 05/04/2016 4:37:54 PM This report has been signed electronically.

## 2016-05-04 NOTE — Progress Notes (Signed)
To PACU VSS, Report to RN 

## 2016-05-04 NOTE — Progress Notes (Signed)
Called to room to assist during endoscopic procedure.  Patient ID and intended procedure confirmed with present staff. Received instructions for my participation in the procedure from the performing physician.  

## 2016-05-04 NOTE — Patient Instructions (Signed)
Impressions/recommendations:  Mucosal irritation  YOU HAD AN ENDOSCOPIC PROCEDURE TODAY AT [facility]:   Refer to the procedure report that was given to you for any specific questions about what was found during the examination.  If the procedure report does not answer your questions, please call your gastroenterologist to clarify.  If you requested that your care partner not be given the details of your procedure findings, then the procedure report has been included in a sealed envelope for you to review at your convenience later.  YOU SHOULD EXPECT: Some feelings of bloating in the abdomen. Passage of more gas than usual.  Walking can help get rid of the air that was put into your GI tract during the procedure and reduce the bloating. If you had a lower endoscopy (such as a colonoscopy or flexible sigmoidoscopy) you may notice spotting of blood in your stool or on the toilet paper. If you underwent a bowel prep for your procedure, you may not have a normal bowel movement for a few days.  Please Note:  You might notice some irritation and congestion in your nose or some drainage.  This is from the oxygen used during your procedure.  There is no need for concern and it should clear up in a day or so.  SYMPTOMS TO REPORT IMMEDIATELY:   Following lower endoscopy (colonoscopy or flexible sigmoidoscopy):  Excessive amounts of blood in the stool  Significant tenderness or worsening of abdominal pains  Swelling of the abdomen that is new, acute  Fever of 100F or higher  For urgent or emergent issues, a gastroenterologist can be reached at any hour by calling [phone].   DIET:  We do recommend a small meal at first, but then you may proceed to your regular diet.  Drink plenty of fluids but you should avoid alcoholic beverages for 24 hours.  ACTIVITY:  You should plan to take it easy for the rest of today and you should NOT DRIVE or use heavy machinery until tomorrow (because  of the sedation medicines used during the test).    FOLLOW UP: Our staff will call the number listed on your records the next business day following your procedure to check on you and address any questions or concerns that you may have regarding the information given to you following your procedure. If we do not reach you, we will leave a message.  However, if you are feeling well and you are not experiencing any problems, there is no need to return our call.  We will assume that you have returned to your regular daily activities without incident.  If any biopsies were taken you will be contacted by phone or by letter within the next 1-3 weeks.  Please call us at [phone] if you have not heard about the biopsies in 3 weeks.    SIGNATURES/CONFIDENTIALITY: You and/or your care partner have signed paperwork which will be entered into your electronic medical record.  These signatures attest to the fact that that the information above on your After Visit Summary has been reviewed and is understood.  Full responsibility of the confidentiality of this discharge information lies with you and/or your care-partner.

## 2016-05-07 ENCOUNTER — Telehealth: Payer: Self-pay

## 2016-05-07 NOTE — Telephone Encounter (Signed)
  Follow up Call-  Call back number 05/04/2016  Post procedure Call Back phone  # [phone]  Permission to leave phone message Yes  Some recent data might be hidden     Patient questions:  Do you have a fever, pain , or abdominal swelling? No. Pain Score  0 *  Have you tolerated food without any problems? Yes.    Have you been able to return to your normal activities? Yes.    Do you have any questions about your discharge instructions: Diet   No. Medications  No. Follow up visit  No.  Do you have questions or concerns about your Care? No.  Actions: * If pain score is 4 or above: No action needed, pain <4.

## 2016-05-08 ENCOUNTER — Telehealth: Payer: Self-pay | Admitting: Gastroenterology

## 2016-05-08 NOTE — Telephone Encounter (Signed)
Patient has been scheduled for follow up with Christy [name] on 05/23/16 at 1:30pm.

## 2016-05-08 NOTE — Telephone Encounter (Signed)
Yes or if she can come in at 8:30 am on 05/23/16 you may use that spot. She has seen Amy Esterwood in the past.

## 2016-05-09 ENCOUNTER — Telehealth: Payer: Self-pay | Admitting: Gastroenterology

## 2016-05-09 DIAGNOSIS — K625 Hemorrhage of anus and rectum: Secondary | ICD-10-CM

## 2016-05-09 MED ORDER — MESALAMINE 1.2 G PO TBEC: 4.8000 g | DELAYED_RELEASE_TABLET | Freq: Every day | ORAL | 3 refills | Status: DC

## 2016-05-09 NOTE — Telephone Encounter (Signed)
Spoke with pharmacy sent remaining balance of 90 to patients pharmacy

## 2016-05-11 ENCOUNTER — Other Ambulatory Visit: Payer: Self-pay

## 2016-05-11 MED ORDER — MESALAMINE 1000 MG RE SUPP: 1000.0000 mg | Freq: Two times a day (BID) | RECTAL | 0 refills | Status: DC

## 2016-05-18 ENCOUNTER — Ambulatory Visit (HOSPITAL_COMMUNITY): Admit: 2016-05-18 | Payer: 59 | Admitting: Gastroenterology

## 2016-05-18 ENCOUNTER — Encounter (HOSPITAL_COMMUNITY): Payer: Self-pay

## 2016-05-18 SURGERY — MANOMETRY, ANORECTAL

## 2016-05-23 ENCOUNTER — Encounter: Payer: Self-pay | Admitting: Physician Assistant

## 2016-05-23 ENCOUNTER — Ambulatory Visit (INDEPENDENT_AMBULATORY_CARE_PROVIDER_SITE_OTHER): Payer: 59 | Admitting: Physician Assistant

## 2016-05-23 VITALS — BP 90/60 | HR 66 | Ht 67.75 in | Wt 132.0 lb

## 2016-05-23 DIAGNOSIS — K51219 Ulcerative (chronic) proctitis with unspecified complications: Secondary | ICD-10-CM

## 2016-05-23 DIAGNOSIS — K625 Hemorrhage of anus and rectum: Secondary | ICD-10-CM

## 2016-05-23 MED ORDER — MESALAMINE 1.2 G PO TBEC: 2.4000 g | DELAYED_RELEASE_TABLET | Freq: Two times a day (BID) | ORAL | 8 refills | Status: DC

## 2016-05-23 MED ORDER — MESALAMINE 1000 MG RE SUPP: 1000.0000 mg | Freq: Every day | RECTAL | 1 refills | Status: DC

## 2016-05-23 NOTE — Progress Notes (Signed)
Subjective:    Patient ID: Christy [name], female    DOB: 11-30-1955, 61 y.o.   MRN: [id number]  HPI Christy [name] is a very nice 61 year old white female, known to Dr. 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 More recently she began having mucus and loose stools and small amounts of blood. Sigmoidoscopy on 05/04/2016 showed ulcerated rectal mucosa diffuse erythema and altered vascularity from the anal verge to 20 cm the remainder of the left colon appeared normal. Biopsies were taken showing chronic moderately active colitis, negative for CMV. She was started on Canasa suppositories at bedtime 3 weeks Lialda  4.8 g daily.   Patient says that her symptoms improved fairly quickly and that she is now having formed stools though soft but just once or twice daily. She has no complaints of abdominal or rectal pain and has not seen any blood over the past 2 weeks. She says she may intermittently still see a small amount of mucus. She has lost 5 pounds since her last office visit but said she had been severely limiting her diet or fear of worsening her symptoms. She also relates that she's been under tremendous amount of stress over the past several months and that may have been what triggered her flare.  Review of Systems Pertinent positive and negative review of systems were noted in the above HPI section.  All other review of systems was otherwise negative.  Outpatient Encounter Prescriptions as of 05/23/2016  Medication Sig  . ALPRAZolam (XANAX) 1 MG tablet Take 1 mg by mouth at bedtime as needed. Take 1 1/2 tabs by mouth at bedtime   . cholecalciferol (VITAMIN D) 400 units TABS tablet Take 400 Units by mouth.  . fish oil-omega-3 fatty acids 1000 MG capsule Take 2 g by mouth daily.  . Magnesium Citrate POWD Take 1 Dose by mouth daily. CALM  . mesalamine (CANASA)  1000 MG suppository Place 1 suppository (1,000 mg total) rectally at bedtime.  . mesalamine (LIALDA) 1.2 g EC tablet Take 2 tablets (2.4 g total) by mouth 2 (two) times daily.  . Probiotic Product (ALIGN) 4 MG CAPS Take by mouth.  . spironolactone (ALDACTONE) 25 MG tablet Take 25 mg by mouth daily.   Marland Kitchen SYNTHROID 150 MCG tablet Take 150 mcg by mouth daily.  . [DISCONTINUED] hydrocortisone (ANUSOL-HC) 25 MG suppository Place 1 suppository (25 mg total) rectally at bedtime.  . [DISCONTINUED] mesalamine (CANASA) 1000 MG suppository Place 1 suppository (1,000 mg total) rectally 2 (two) times daily.  . [DISCONTINUED] mesalamine (LIALDA) 1.2 g EC tablet Take 4 tablets (4.8 g total) by mouth daily with breakfast.  . [DISCONTINUED] cholecalciferol (VITAMIN D) 1000 units tablet Take 1,000 Units by mouth daily.   Facility-Administered Encounter Medications as of 05/23/2016  Medication  . 0.9 %  sodium chloride infusion   Allergies  Allergen Reactions  . Adhesive [Tape]   . Latex Hives  . Oxycodone Itching  . Tetracycline     REACTION: diarrhea  . Tranxene [Clorazepate Dipotassium]     itche   Patient Active Problem List   Diagnosis Date Noted  . DIARRHEA 03/18/2009  . HYPERSOMNIA 01/28/2009  . ULCERATIVE PROCTITIS 05/05/2008  . FIBROMYALGIA 03/16/2008  . RECTAL BLEEDING 02/13/2008  . PERSONAL HX COLONIC POLYPS 02/13/2008  . INADEQUATE SLEEP HYGIENE 12/03/2007  . COLONIC POLYPS 11/05/2007  . HYPOTHYROIDISM 11/05/2007  . VITAMIN D  DEFICIENCY 11/05/2007  . HYPERLIPIDEMIA 11/05/2007  . RESTLESS LEG SYNDROME 11/05/2007  . HYPERTENSION 11/05/2007  . GERD 11/05/2007  . CONSTIPATION 11/05/2007  . IBS 11/05/2007  . OSTEOPENIA 11/05/2007  . ACUT PEPTC ULCR UNS SITE W/HEM W/O MENTION OBST 08/01/2007   Social History   Social History  . Marital status: Married    Spouse name: N/A  . Number of children: N/A  . Years of education: N/A   Occupational History  . Personal assistant     Social History Main Topics  . Smoking status: Former Smoker    Packs/day: 0.10    Years: 15.00    Types: Cigarettes    Quit date: 02/27/1980  . Smokeless tobacco: Never Used  . Alcohol use No  . Drug use: No  . Sexual activity: Not on file   Other Topics Concern  . Not on file   Social History Narrative  . No narrative on file    Christy [name]'s family history includes Diabetes in her father and mother; Kidney disease in her mother; Leukemia in her paternal grandfather; Lung cancer in her maternal grandfather; Melanoma in her sister.      Objective:    Vitals:   05/23/16 1330  BP: 90/60  Pulse: 66    Physical Exam well-developed white female in no acute distress, pleasant blood pressure 90/60 pulse 66, BMI 20.2. HEENT; nontraumatic normocephalic EOMI PERRLA sclera anicteric, Cardiovascular; regular rate and rhythm with S1-S2 no murmur or gallop, Pulmonary ;clear bilaterally, Abdomen; flat soft nontender nondistended bowel sounds are active no palpable mass or hepatosplenomegaly, Rectal ;exam not done, Neuropsych; mood and affect appropriate       Assessment & Plan:   #14 61 year old white female with previous history of proctosigmoiditis which had been quiescent. Recent exacerbation of symptoms and flexible sigmoidoscopy on 05/04/2016 showed ulcerative proctitis which was confirmed on biopsy. She has had good response to Canasa suppositories and Lialda 4.8 gm daily #2 colon cancer surveillance-last full colonoscopy 2015-will be due for follow-up 2020  Plan; continue Lialda 4.8 g daily,, we discussed possibility of reducing dose in several months but continuing maintenance therapy. He has completed a 3 week course of Canasa suppositories, should she have any increase in mucous or return of hematochezia she is advised to restart at least a two-week course of Canasa suppositories daily at bedtime, we will send refills. Encouraged her to branch out on her diet, and gradually  resume her regular diet. Follow-up with Dr. Silverio Decamp or myself in 4-6 months, sooner if needed.  Amy Genia Harold PA-C 05/23/2016   Cc: Reynold Bowen, MD

## 2016-05-23 NOTE — Patient Instructions (Signed)
We have sent the following medications to your pharmacy for you to pick up at your convenience: Lialda 1.2 g taking 2 capsules by mouth twice daily and Canasa suppositories to use rectally at bedtime x 2 weeks then as needed.    Follow up with Dr. Silverio Decamp in 4-6 months but please call ahead for appointment.

## 2016-05-24 NOTE — Progress Notes (Signed)
Reviewed and agree with documentation and assessment and plan. K. Veena Ahmad Vanwey , MD   

## 2016-07-03 ENCOUNTER — Telehealth: Payer: Self-pay | Admitting: Gastroenterology

## 2016-07-03 NOTE — Telephone Encounter (Signed)
Christy [name] reports she started seeing mucous in her stool again last week. She has been very careful with her diet except for a couple of days she had to eat "hospital food." She has a "little belly ache." No bloody stools. She also states she never stopped the Canasa suppositories. See the last office note.

## 2016-07-03 NOTE — Telephone Encounter (Signed)
Please schedule office appt soon to evaluate. Check C.diff. Thanks

## 2016-07-04 NOTE — Telephone Encounter (Signed)
Left message to call back  

## 2016-07-04 NOTE — Telephone Encounter (Signed)
Please advise patient to avoid Magnesium and take Miralax as needed instead if she is having hard stool. Ok to stop Canasa if no longer bleeding. Thanks

## 2016-07-04 NOTE — Telephone Encounter (Signed)
Spoke with the patient. Stools are soft formed. She leans towards hard stools. She reports today that she also takes a teaspoon of magnesium daily because it seems to keep her stools soft. She does this instead of Miralax. She asks if she should go ahead and stop the Canasa supp. "cold [place]" or continue them until she is seen? I have her an appointment next Wednesday 07/11/16. Thanks

## 2016-07-04 NOTE — Telephone Encounter (Signed)
Patient agrees to this plan. She admits to be very discouraged with the control UC has over her life.

## 2016-07-06 DIAGNOSIS — E038 Other specified hypothyroidism: Secondary | ICD-10-CM | POA: Diagnosis not present

## 2016-07-06 DIAGNOSIS — M859 Disorder of bone density and structure, unspecified: Secondary | ICD-10-CM | POA: Diagnosis not present

## 2016-07-06 DIAGNOSIS — E784 Other hyperlipidemia: Secondary | ICD-10-CM | POA: Diagnosis not present

## 2016-07-09 DIAGNOSIS — M7581 Other shoulder lesions, right shoulder: Secondary | ICD-10-CM | POA: Diagnosis not present

## 2016-07-09 DIAGNOSIS — M25511 Pain in right shoulder: Secondary | ICD-10-CM | POA: Diagnosis not present

## 2016-07-09 DIAGNOSIS — M19011 Primary osteoarthritis, right shoulder: Secondary | ICD-10-CM | POA: Diagnosis not present

## 2016-07-10 ENCOUNTER — Telehealth: Payer: Self-pay | Admitting: Gastroenterology

## 2016-07-10 DIAGNOSIS — M7581 Other shoulder lesions, right shoulder: Secondary | ICD-10-CM | POA: Diagnosis not present

## 2016-07-11 ENCOUNTER — Ambulatory Visit: Payer: 59 | Admitting: Nurse Practitioner

## 2016-07-16 ENCOUNTER — Other Ambulatory Visit: Payer: Self-pay | Admitting: Physician Assistant

## 2016-07-26 DIAGNOSIS — M7581 Other shoulder lesions, right shoulder: Secondary | ICD-10-CM | POA: Diagnosis not present

## 2016-08-06 DIAGNOSIS — M7501 Adhesive capsulitis of right shoulder: Secondary | ICD-10-CM | POA: Diagnosis not present

## 2016-08-09 DIAGNOSIS — M7581 Other shoulder lesions, right shoulder: Secondary | ICD-10-CM | POA: Diagnosis not present

## 2016-08-16 DIAGNOSIS — M7581 Other shoulder lesions, right shoulder: Secondary | ICD-10-CM | POA: Diagnosis not present

## 2016-08-17 DIAGNOSIS — Z1231 Encounter for screening mammogram for malignant neoplasm of breast: Secondary | ICD-10-CM | POA: Diagnosis not present

## 2016-08-20 DIAGNOSIS — M7581 Other shoulder lesions, right shoulder: Secondary | ICD-10-CM | POA: Diagnosis not present

## 2016-08-30 DIAGNOSIS — M7581 Other shoulder lesions, right shoulder: Secondary | ICD-10-CM | POA: Diagnosis not present

## 2016-09-05 DIAGNOSIS — M7501 Adhesive capsulitis of right shoulder: Secondary | ICD-10-CM | POA: Diagnosis not present

## 2016-09-05 DIAGNOSIS — M25511 Pain in right shoulder: Secondary | ICD-10-CM | POA: Diagnosis not present

## 2016-09-05 DIAGNOSIS — G8929 Other chronic pain: Secondary | ICD-10-CM | POA: Diagnosis not present

## 2016-09-06 DIAGNOSIS — M7581 Other shoulder lesions, right shoulder: Secondary | ICD-10-CM | POA: Diagnosis not present

## 2016-09-13 DIAGNOSIS — M7581 Other shoulder lesions, right shoulder: Secondary | ICD-10-CM | POA: Diagnosis not present

## 2016-09-20 DIAGNOSIS — M7581 Other shoulder lesions, right shoulder: Secondary | ICD-10-CM | POA: Diagnosis not present

## 2016-09-27 DIAGNOSIS — M7581 Other shoulder lesions, right shoulder: Secondary | ICD-10-CM | POA: Diagnosis not present

## 2016-10-04 DIAGNOSIS — M7581 Other shoulder lesions, right shoulder: Secondary | ICD-10-CM | POA: Diagnosis not present

## 2016-10-05 DIAGNOSIS — Z780 Asymptomatic menopausal state: Secondary | ICD-10-CM | POA: Diagnosis not present

## 2016-10-05 DIAGNOSIS — M8589 Other specified disorders of bone density and structure, multiple sites: Secondary | ICD-10-CM | POA: Diagnosis not present

## 2016-10-11 DIAGNOSIS — M7581 Other shoulder lesions, right shoulder: Secondary | ICD-10-CM | POA: Diagnosis not present

## 2016-10-12 DIAGNOSIS — D225 Melanocytic nevi of trunk: Secondary | ICD-10-CM | POA: Diagnosis not present

## 2016-10-12 DIAGNOSIS — E038 Other specified hypothyroidism: Secondary | ICD-10-CM | POA: Diagnosis not present

## 2016-10-12 DIAGNOSIS — Z808 Family history of malignant neoplasm of other organs or systems: Secondary | ICD-10-CM | POA: Diagnosis not present

## 2016-10-12 DIAGNOSIS — L570 Actinic keratosis: Secondary | ICD-10-CM | POA: Diagnosis not present

## 2016-10-12 DIAGNOSIS — L820 Inflamed seborrheic keratosis: Secondary | ICD-10-CM | POA: Diagnosis not present

## 2016-10-12 DIAGNOSIS — Z86018 Personal history of other benign neoplasm: Secondary | ICD-10-CM | POA: Diagnosis not present

## 2016-10-17 ENCOUNTER — Telehealth: Payer: Self-pay | Admitting: Gastroenterology

## 2016-10-17 DIAGNOSIS — K5190 Ulcerative colitis, unspecified, without complications: Secondary | ICD-10-CM | POA: Diagnosis not present

## 2016-10-17 DIAGNOSIS — K219 Gastro-esophageal reflux disease without esophagitis: Secondary | ICD-10-CM | POA: Diagnosis not present

## 2016-10-17 NOTE — Telephone Encounter (Signed)
I called the patient. She agrees to come in on 10/19/16 at 2:00 pm to be seen for UC flare and weight loss. She states most days she has one BM but some days, especially Mondays she can have "30" bowel movements.

## 2016-10-17 NOTE — Telephone Encounter (Signed)
I left a message for Kenney Houseman at [facility]. To let her know I had spoken with the patient.

## 2016-10-19 ENCOUNTER — Encounter: Payer: Self-pay | Admitting: *Deleted

## 2016-10-19 ENCOUNTER — Ambulatory Visit (INDEPENDENT_AMBULATORY_CARE_PROVIDER_SITE_OTHER): Payer: 59 | Admitting: Physician Assistant

## 2016-10-19 ENCOUNTER — Other Ambulatory Visit (INDEPENDENT_AMBULATORY_CARE_PROVIDER_SITE_OTHER): Payer: 59

## 2016-10-19 VITALS — BP 92/68 | HR 66 | Ht 66.0 in | Wt 125.0 lb

## 2016-10-19 DIAGNOSIS — R1084 Generalized abdominal pain: Secondary | ICD-10-CM | POA: Diagnosis not present

## 2016-10-19 DIAGNOSIS — R110 Nausea: Secondary | ICD-10-CM

## 2016-10-19 DIAGNOSIS — R634 Abnormal weight loss: Secondary | ICD-10-CM

## 2016-10-19 DIAGNOSIS — K51318 Ulcerative (chronic) rectosigmoiditis with other complication: Secondary | ICD-10-CM

## 2016-10-19 LAB — URINALYSIS, ROUTINE W REFLEX MICROSCOPIC
Bilirubin Urine: NEGATIVE
HGB URINE DIPSTICK: NEGATIVE
Ketones, ur: NEGATIVE
Nitrite: NEGATIVE
PH: 6 (ref 5.0–8.0)
Specific Gravity, Urine: <=1.005 — AB (ref 1.000–1.030)
TOTAL PROTEIN, URINE-UPE24: NEGATIVE
URINE GLUCOSE: NEGATIVE
UROBILINOGEN UA: 0.2 (ref 0.0–1.0)

## 2016-10-19 LAB — SEDIMENTATION RATE: Sed Rate: 6 mm/hr (ref 0–30)

## 2016-10-19 LAB — IGA: IGA: 175 mg/dL (ref 68–378)

## 2016-10-19 LAB — HIGH SENSITIVITY CRP: CRP, High Sensitivity: 0.5 mg/L (ref 0.000–5.000)

## 2016-10-19 MED ORDER — OMEPRAZOLE 20 MG PO CPDR: 20.0000 mg | DELAYED_RELEASE_CAPSULE | Freq: Every day | ORAL | 6 refills | Status: DC

## 2016-10-19 MED ORDER — MESALAMINE 1.2 G PO TBEC: 2.4000 g | DELAYED_RELEASE_TABLET | Freq: Two times a day (BID) | ORAL | 3 refills | Status: DC

## 2016-10-19 MED ORDER — BUDESONIDE ER 9 MG PO TB24: 9.0000 mg | ORAL_TABLET | Freq: Every day | ORAL | 1 refills | Status: DC

## 2016-10-19 NOTE — Progress Notes (Signed)
Subjective:    Patient ID: Christy [name], female    DOB: Jan 15, 1956, 61 y.o.   MRN: [id number]  HPI Christy [name] is a 61 year old white female, known to Dr. Silverio Decamp with diagnosis of ulcerative proctosigmoiditis. She was last seen in our office in March 2018 and underwent sigmoidoscopy in March 2018 showing ulcerated rectal mucosa with diffuse erythema from the anal verge to 20 cm. Biopsies showed chronic moderately active colitis no evidence of CMV. She had been treated with Canasa suppositories and Lialda 4.8 g daily. She comes in today stating that she has continued on the Lialda. She reports that she had difficulty advancing her diet for several months this spring but was finally able to increase fiber with Benefiber add back some salads etc. Over the summer she's been having fairly normal bowel movements without any obvious blood. She is very concerned today because she has lost about 8 pounds since May. Her appetite has been decreased since July and she's had some nonspecific abdominal discomfort which seems to be worse with eating. She has been eating very bland flu foods including rice mashed potatoes etc. she complains of a fullness in her abdomen generalized discomfort and burning at times associated with some nausea but no vomiting. She feels that she is gluten sensitive and has been avoiding gluten. She has had labs done by primary care and brought copies today chemistries are unremarkable WBC is 6.8 hemoglobin 14.6 hematocrit of 41.8, done 10/17/2016 she says TSH the week before was normal. She was also started on omeprazole 40 mg per primary care and has just started 2 days ago. Patient does have history of colon polyps, will be due for follow-up colonoscopy 2020, also previous history of gastritis and gastric ulcer, fibromyalgia hypothyroidism and hypertension. Upper abdominal ultrasound 2016 no cholelithiasis.  Review of Systems Pertinent positive and negative review of systems were noted  in the above HPI section.  All other review of systems was otherwise negative.  Outpatient Encounter Prescriptions as of 10/19/2016  Medication Sig  . ALPRAZolam (XANAX) 1 MG tablet Take 1 mg by mouth at bedtime as needed. Take 1 1/2 tabs by mouth at bedtime   . cholecalciferol (VITAMIN D) 400 units TABS tablet Take 400 Units by mouth.  . fish oil-omega-3 fatty acids 1000 MG capsule Take 2 g by mouth daily.  . Probiotic Product (ALIGN) 4 MG CAPS Take by mouth.  . SYNTHROID 150 MCG tablet Take 150 mcg by mouth daily.  . Wheat Dextrin (BENEFIBER PO) Take by mouth every morning.  . [DISCONTINUED] Magnesium Citrate POWD Take 1 Dose by mouth daily. CALM  . [DISCONTINUED] mesalamine (LIALDA) 1.2 g EC tablet Take 2 tablets (2.4 g total) by mouth 2 (two) times daily.  . [DISCONTINUED] spironolactone (ALDACTONE) 25 MG tablet Take 25 mg by mouth daily.   . Budesonide ER (UCERIS) 9 MG TB24 Take 9 mg by mouth daily.  . mesalamine (LIALDA) 1.2 g EC tablet Take 2 tablets (2.4 g total) by mouth 2 (two) times daily.  Marland Kitchen omeprazole (PRILOSEC) 20 MG capsule Take 1 capsule (20 mg total) by mouth daily.  . [DISCONTINUED] mesalamine (CANASA) 1000 MG suppository Place 1 suppository (1,000 mg total) rectally at bedtime.   Facility-Administered Encounter Medications as of 10/19/2016  Medication  . 0.9 %  sodium chloride infusion   Allergies  Allergen Reactions  . Adhesive [Tape]   . Latex Hives  . Oxycodone Itching  . Tetracycline     REACTION: diarrhea  . Tranxene [  Clorazepate Dipotassium]     itche   Patient Active Problem List   Diagnosis Date Noted  . DIARRHEA 03/18/2009  . HYPERSOMNIA 01/28/2009  . ULCERATIVE PROCTITIS 05/05/2008  . FIBROMYALGIA 03/16/2008  . RECTAL BLEEDING 02/13/2008  . PERSONAL HX COLONIC POLYPS 02/13/2008  . INADEQUATE SLEEP HYGIENE 12/03/2007  . COLONIC POLYPS 11/05/2007  . HYPOTHYROIDISM 11/05/2007  . VITAMIN D DEFICIENCY 11/05/2007  . HYPERLIPIDEMIA 11/05/2007  .  RESTLESS LEG SYNDROME 11/05/2007  . HYPERTENSION 11/05/2007  . GERD 11/05/2007  . CONSTIPATION 11/05/2007  . IBS 11/05/2007  . OSTEOPENIA 11/05/2007  . ACUT PEPTC ULCR UNS SITE W/HEM W/O MENTION OBST 08/01/2007   Social History   Social History  . Marital status: Married    Spouse name: N/A  . Number of children: N/A  . Years of education: N/A   Occupational History  . Personal assistant    Social History Main Topics  . Smoking status: Former Smoker    Packs/day: 0.10    Years: 15.00    Types: Cigarettes    Quit date: 02/27/1980  . Smokeless tobacco: Never Used  . Alcohol use No  . Drug use: No  . Sexual activity: Not on file   Other Topics Concern  . Not on file   Social History Narrative  . No narrative on file    Christy [name]'s family history includes Breast cancer in her unknown relative and unknown relative; Colon cancer in her unknown relative; Diabetes in her father and mother; Kidney disease in her mother; Leukemia in her paternal grandfather; Lung cancer in her maternal grandfather; Melanoma in her sister.      Objective:    Vitals:   10/19/16 1406  BP: 92/68  Pulse: 66    Physical Exam well-developed older white female in no acute distress, height 5 foot 6, weight 125, BMI 20.1. HEENT nontraumatic, cephalic EOMI PERRLA sclera anicteric, Cardiovascular regular rate and rhythm with S1-S2 no murmur or gallop, Pulmonary clear bilaterally, Abdomen soft, is no focal tenderness, mild tenderness in the right lower quadrant no guarding or rebound no palpable mass or hepatosplenomegaly, Rectal exam not done, [place] is no clubbing cyanosis or edema skin warm and dry, Neuropsych mood and affect appropriate       Assessment & Plan:   #44  61 year old white female with history of ulcerative proctosigmoiditis on Lialda 4.8 g maintenance he comes in with complaints of weight loss, decrease in appetite and vague rather generalized abdominal discomfort burning and  fullness. Interestingly she has not been having any diarrhea or hematochezia. Etiology of her weight loss and pain is not clear, her symptoms are somewhat atypical for colitis exacerbation though she feels that her symptoms are secondary to her bowel and not her stomach. #2 fibromyalgia #3 hypothyroidism #4 history of colon polyps-due for follow-up colonoscopy 2020 #5 gluten sensitivity  Plan; sedimentation rate, CRP, TTG and IgA, UA and microscopic (patient with dark urine 1 day this week  ) Genitalia Lialda 4.8 g daily continue omeprazole 40 mg by mouth every morning 6 weeks Start Uceris 9 mg by mouth daily with plan for 8 week course of symptoms responsive We discussed potential need for further imaging with CT and/or colonoscopy if symptoms do not improve soon. She does not want to pursue either at this point.   Aldean Suddeth Genia Harold PA-C 10/19/2016   Cc: Reynold Bowen, MD

## 2016-10-19 NOTE — Patient Instructions (Addendum)
Go to the basement today for labs  Continue Lialda 2.4g twice a day  Continue Omeprazole 20mg  daily   We will send Uceris, Lialda and Omeprazole toyour pharmacy

## 2016-10-20 NOTE — Progress Notes (Signed)
Reviewed and agree with documentation and assessment and plan. K. Veena Nikira Kushnir , MD   

## 2016-10-22 ENCOUNTER — Other Ambulatory Visit: Payer: Self-pay

## 2016-10-22 ENCOUNTER — Other Ambulatory Visit: Payer: 59

## 2016-10-22 ENCOUNTER — Encounter: Payer: Self-pay | Admitting: Physician Assistant

## 2016-10-22 DIAGNOSIS — N390 Urinary tract infection, site not specified: Secondary | ICD-10-CM

## 2016-10-22 LAB — TISSUE TRANSGLUTAMINASE, IGA: Tissue Transglutaminase Ab, IgA: 1 U/mL (ref <4)

## 2016-10-22 MED ORDER — CIPROFLOXACIN HCL 250 MG PO TABS: 250.0000 mg | ORAL_TABLET | Freq: Two times a day (BID) | ORAL | 0 refills | Status: DC

## 2016-10-23 LAB — URINE CULTURE: Organism ID, Bacteria: NO GROWTH

## 2016-10-24 ENCOUNTER — Telehealth: Payer: Self-pay | Admitting: Physician Assistant

## 2016-10-24 NOTE — Telephone Encounter (Signed)
See patient advice request. 

## 2016-10-25 DIAGNOSIS — M7581 Other shoulder lesions, right shoulder: Secondary | ICD-10-CM | POA: Diagnosis not present

## 2016-10-31 ENCOUNTER — Ambulatory Visit: Payer: 59 | Admitting: Physician Assistant

## 2016-10-31 DIAGNOSIS — R109 Unspecified abdominal pain: Secondary | ICD-10-CM | POA: Diagnosis not present

## 2016-10-31 DIAGNOSIS — Z01411 Encounter for gynecological examination (general) (routine) with abnormal findings: Secondary | ICD-10-CM | POA: Diagnosis not present

## 2016-11-12 DIAGNOSIS — R300 Dysuria: Secondary | ICD-10-CM | POA: Diagnosis not present

## 2016-11-12 DIAGNOSIS — N760 Acute vaginitis: Secondary | ICD-10-CM | POA: Diagnosis not present

## 2016-11-12 DIAGNOSIS — N3000 Acute cystitis without hematuria: Secondary | ICD-10-CM | POA: Diagnosis not present

## 2016-11-14 ENCOUNTER — Telehealth: Payer: Self-pay | Admitting: Gastroenterology

## 2016-11-14 NOTE — Telephone Encounter (Signed)
Fax number given to me by the patient. Faxed UA and culture (which was No Growth) to [facility]  [phone]

## 2016-11-16 DIAGNOSIS — N760 Acute vaginitis: Secondary | ICD-10-CM | POA: Diagnosis not present

## 2016-11-16 DIAGNOSIS — R350 Frequency of micturition: Secondary | ICD-10-CM | POA: Diagnosis not present

## 2016-11-28 ENCOUNTER — Telehealth: Payer: Self-pay | Admitting: Physician Assistant

## 2016-11-28 ENCOUNTER — Ambulatory Visit: Payer: 59 | Admitting: Gastroenterology

## 2016-11-28 NOTE — Telephone Encounter (Signed)
Patient will call her insurance to ask the reason for the jump to $1000 for 30 days of Lialda. She has been compliant with Lialda. Has only a couple of days of medication left. Are there other options? She also asks about needing imaging to determine the cause of her low abdominal discomfort. This is described as a burning pain that feels nagging, not stabbing. It is relieved when she has a bowel movement. It is not every day, but it is most days.

## 2016-11-28 NOTE — Telephone Encounter (Signed)
Patient states she is still having gi symptoms and is having a problem getting medication lialda due to it being too expensive. Patient would like a call to discuss.

## 2016-11-29 NOTE — Telephone Encounter (Signed)
I asked the patient to call her insurance to find out why the price changed. The pharmacist assures me it is not a prior authorization issue or a rejection. All prescriptions convert to generic unless dispense as written is specifically indicated or there is no generic. What other mesalamine would you consider for her? I will call her to lay out the options.

## 2016-11-29 NOTE — Telephone Encounter (Signed)
Can we send rx for generic Lialda at same dose ?  If that will not be covered ,will need to check with her insurance to see what mesalamine  Is preferred for UC .   If she is no better after being on Uceris 9  mg daily over the past month or so, and still losing wieight etc  then I think we should do CT abd/pelvis with contrast as next step and also get her an appt with Dr Silverio Decamp first available

## 2016-11-30 DIAGNOSIS — N952 Postmenopausal atrophic vaginitis: Secondary | ICD-10-CM | POA: Diagnosis not present

## 2016-11-30 DIAGNOSIS — R350 Frequency of micturition: Secondary | ICD-10-CM | POA: Diagnosis not present

## 2016-11-30 NOTE — Telephone Encounter (Signed)
OK - otherwise  shw will need to contact her insurance and find out what medication is preferred for ulcerative colitis similar to lialda - perhaps Asacol or Apriso

## 2016-11-30 NOTE — Telephone Encounter (Signed)
The patient has found out that she has to meet a new deductible for her insurance in regards to medications. She has found coupons and is hoping this will make her medication affordable.  She has seen her GYN. The burning she has been experiencing is due to vaginitis which has been caused because of menopause. She will be starting estrogen vaginal cream. She reports weight is steady.  She will follow up in 4 weeks.

## 2016-11-30 NOTE — Telephone Encounter (Signed)
Patient states that she is now out of lialda. Best # [phone]

## 2016-12-28 DIAGNOSIS — M8580 Other specified disorders of bone density and structure, unspecified site: Secondary | ICD-10-CM | POA: Diagnosis not present

## 2016-12-28 DIAGNOSIS — N952 Postmenopausal atrophic vaginitis: Secondary | ICD-10-CM | POA: Diagnosis not present

## 2017-01-03 ENCOUNTER — Other Ambulatory Visit: Payer: Self-pay | Admitting: Obstetrics

## 2017-01-09 DIAGNOSIS — E038 Other specified hypothyroidism: Secondary | ICD-10-CM | POA: Diagnosis not present

## 2017-01-09 DIAGNOSIS — K5190 Ulcerative colitis, unspecified, without complications: Secondary | ICD-10-CM | POA: Diagnosis not present

## 2017-01-09 DIAGNOSIS — M25511 Pain in right shoulder: Secondary | ICD-10-CM | POA: Diagnosis not present

## 2017-01-23 ENCOUNTER — Encounter (HOSPITAL_COMMUNITY): Payer: Self-pay

## 2017-01-23 ENCOUNTER — Ambulatory Visit (HOSPITAL_COMMUNITY)
Admission: RE | Admit: 2017-01-23 | Discharge: 2017-01-23 | Disposition: A | Discharge status: Home / Self Care | Payer: 59 | Source: Ambulatory Visit | Attending: Obstetrics | Admitting: Obstetrics

## 2017-01-23 DIAGNOSIS — M899 Disorder of bone, unspecified: Secondary | ICD-10-CM | POA: Diagnosis not present

## 2017-01-23 HISTORY — DX: Acute vaginitis: N76.0

## 2017-01-23 HISTORY — DX: Other specified disorders of bone density and structure, unspecified site: M85.80

## 2017-01-23 MED ORDER — SODIUM CHLORIDE 0.9 % IV SOLN
INTRAVENOUS | Status: AC
  Administered 2017-01-23: 13:00:00 via INTRAVENOUS

## 2017-01-23 MED ORDER — ZOLEDRONIC ACID 5 MG/100ML IV SOLN
5.0000 mg | Freq: Once | INTRAVENOUS | Status: AC
  Administered 2017-01-23: 5 mg via INTRAVENOUS
  Filled 2017-01-23: qty 100

## 2017-01-23 NOTE — Discharge Instructions (Signed)
Zoledronic Acid injection (Paget's Disease, Osteoporosis) / reclast °What is this medicine? °ZOLEDRONIC ACID (ZOE le dron ik AS id) lowers the amount of calcium loss from bone. It is used to treat Paget's disease and osteoporosis in women. °This medicine may be used for other purposes; ask your health care provider or pharmacist if you have questions. °COMMON BRAND NAME(S): Reclast, Zometa °What should I tell my health care provider before I take this medicine? °They need to know if you have any of these conditions: °-aspirin-sensitive asthma °-cancer, especially if you are receiving medicines used to treat cancer °-dental disease or wear dentures °-infection °-kidney disease °-low levels of calcium in the blood °-past surgery on the parathyroid gland or intestines °-receiving corticosteroids like dexamethasone or prednisone °-an unusual or allergic reaction to zoledronic acid, other medicines, foods, dyes, or preservatives °-pregnant or trying to get pregnant °-breast-feeding °How should I use this medicine? °This medicine is for infusion into a vein. It is given by a health care professional in a hospital or clinic setting. °Talk to your pediatrician regarding the use of this medicine in children. This medicine is not approved for use in children. °Overdosage: If you think you have taken too much of this medicine contact a poison control center or emergency room at once. °NOTE: This medicine is only for you. Do not share this medicine with others. °What if I miss a dose? °It is important not to miss your dose. Call your doctor or health care professional if you are unable to keep an appointment. °What may interact with this medicine? °-certain antibiotics given by injection °-NSAIDs, medicines for pain and inflammation, like ibuprofen or naproxen °-some diuretics like bumetanide, furosemide °-teriparatide °This list may not describe all possible interactions. Give your health care provider a list of all the  medicines, herbs, non-prescription drugs, or dietary supplements you use. Also tell them if you smoke, drink alcohol, or use illegal drugs. Some items may interact with your medicine. °What should I watch for while using this medicine? °Visit your doctor or health care professional for regular checkups. It may be some time before you see the benefit from this medicine. Do not stop taking your medicine unless your doctor tells you to. Your doctor may order blood tests or other tests to see how you are doing. °Women should inform their doctor if they wish to become pregnant or think they might be pregnant. There is a potential for serious side effects to an unborn child. Talk to your health care professional or pharmacist for more information. °You should make sure that you get enough calcium and vitamin D while you are taking this medicine. Discuss the foods you eat and the vitamins you take with your health care professional. °Some people who take this medicine have severe bone, joint, and/or muscle pain. This medicine may also increase your risk for jaw problems or a broken thigh bone. Tell your doctor right away if you have severe pain in your jaw, bones, joints, or muscles. Tell your doctor if you have any pain that does not go away or that gets worse. °Tell your dentist and dental surgeon that you are taking this medicine. You should not have major dental surgery while on this medicine. See your dentist to have a dental exam and fix any dental problems before starting this medicine. Take good care of your teeth while on this medicine. Make sure you see your dentist for regular follow-up appointments. °What side effects may I notice from receiving   this medicine? °Side effects that you should report to your doctor or health care professional as soon as possible: °-allergic reactions like skin rash, itching or hives, swelling of the face, lips, or tongue °-anxiety, confusion, or depression °-breathing  problems °-changes in vision °-eye pain °-feeling faint or lightheaded, falls °-jaw pain, especially after dental work °-mouth sores °-muscle cramps, stiffness, or weakness °-redness, blistering, peeling or loosening of the skin, including inside the mouth °-trouble passing urine or change in the amount of urine °Side effects that usually do not require medical attention (report to your doctor or health care professional if they continue or are bothersome): °-bone, joint, or muscle pain °-constipation °-diarrhea °-fever °-hair loss °-irritation at site where injected °-loss of appetite °-nausea, vomiting °-stomach upset °-trouble sleeping °-trouble swallowing °-weak or tired °This list may not describe all possible side effects. Call your doctor for medical advice about side effects. You may report side effects to FDA at 1-800-FDA-1088. °Where should I keep my medicine? °This drug is given in a hospital or clinic and will not be stored at home. °NOTE: This sheet is a summary. It may not cover all possible information. If you have questions about this medicine, talk to your doctor, pharmacist, or health care provider. °© 2018 Elsevier/Gold Standard (2013-07-11 14:19:57) ° °

## 2017-01-23 NOTE — Progress Notes (Signed)
Pt's first reclast infusion.  D/c instructions given on reclast.  Pt tolerated well.  Pt stayed for 20 minutes post infusion.  No reaction noted.  Pt was d/c ambulatory with family member to lobby.

## 2017-04-17 DIAGNOSIS — L650 Telogen effluvium: Secondary | ICD-10-CM | POA: Diagnosis not present

## 2017-04-21 ENCOUNTER — Other Ambulatory Visit: Payer: Self-pay | Admitting: Physician Assistant

## 2017-04-26 DIAGNOSIS — D3132 Benign neoplasm of left choroid: Secondary | ICD-10-CM | POA: Diagnosis not present

## 2017-05-24 DIAGNOSIS — H1045 Other chronic allergic conjunctivitis: Secondary | ICD-10-CM | POA: Diagnosis not present

## 2017-05-24 DIAGNOSIS — H16223 Keratoconjunctivitis sicca, not specified as Sjogren's, bilateral: Secondary | ICD-10-CM | POA: Diagnosis not present

## 2017-06-10 ENCOUNTER — Other Ambulatory Visit: Payer: Self-pay | Admitting: Physician Assistant

## 2017-06-19 DIAGNOSIS — H16223 Keratoconjunctivitis sicca, not specified as Sjogren's, bilateral: Secondary | ICD-10-CM | POA: Diagnosis not present

## 2017-06-19 DIAGNOSIS — H1045 Other chronic allergic conjunctivitis: Secondary | ICD-10-CM | POA: Diagnosis not present

## 2017-07-10 DIAGNOSIS — E038 Other specified hypothyroidism: Secondary | ICD-10-CM | POA: Diagnosis not present

## 2017-07-10 DIAGNOSIS — Z1389 Encounter for screening for other disorder: Secondary | ICD-10-CM | POA: Diagnosis not present

## 2017-07-10 DIAGNOSIS — E7849 Other hyperlipidemia: Secondary | ICD-10-CM | POA: Diagnosis not present

## 2017-07-10 DIAGNOSIS — M859 Disorder of bone density and structure, unspecified: Secondary | ICD-10-CM | POA: Diagnosis not present

## 2017-07-17 DIAGNOSIS — I781 Nevus, non-neoplastic: Secondary | ICD-10-CM | POA: Diagnosis not present

## 2017-07-17 DIAGNOSIS — L905 Scar conditions and fibrosis of skin: Secondary | ICD-10-CM | POA: Diagnosis not present

## 2017-07-17 DIAGNOSIS — L650 Telogen effluvium: Secondary | ICD-10-CM | POA: Diagnosis not present

## 2017-08-21 DIAGNOSIS — Z1231 Encounter for screening mammogram for malignant neoplasm of breast: Secondary | ICD-10-CM | POA: Diagnosis not present

## 2017-10-07 ENCOUNTER — Other Ambulatory Visit: Payer: Self-pay | Admitting: Physician Assistant

## 2017-10-08 ENCOUNTER — Telehealth: Payer: Self-pay | Admitting: *Deleted

## 2017-10-08 NOTE — Telephone Encounter (Signed)
Called patient to advise we did send refills on the Lialda 1.2 g, 4 tablets daily to [facility] [address] ,Per Nicoletta Ba PA/PP

## 2017-10-16 DIAGNOSIS — H16223 Keratoconjunctivitis sicca, not specified as Sjogren's, bilateral: Secondary | ICD-10-CM | POA: Diagnosis not present

## 2017-11-01 DIAGNOSIS — Z01419 Encounter for gynecological examination (general) (routine) without abnormal findings: Secondary | ICD-10-CM | POA: Diagnosis not present

## 2017-11-01 DIAGNOSIS — Z6820 Body mass index (BMI) 20.0-20.9, adult: Secondary | ICD-10-CM | POA: Diagnosis not present

## 2017-12-31 ENCOUNTER — Other Ambulatory Visit: Payer: Self-pay | Admitting: Physician Assistant

## 2018-01-08 DIAGNOSIS — K5190 Ulcerative colitis, unspecified, without complications: Secondary | ICD-10-CM | POA: Diagnosis not present

## 2018-01-08 DIAGNOSIS — E038 Other specified hypothyroidism: Secondary | ICD-10-CM | POA: Diagnosis not present

## 2018-01-08 DIAGNOSIS — E7849 Other hyperlipidemia: Secondary | ICD-10-CM | POA: Diagnosis not present

## 2018-01-08 DIAGNOSIS — E559 Vitamin D deficiency, unspecified: Secondary | ICD-10-CM | POA: Diagnosis not present

## 2018-01-13 ENCOUNTER — Other Ambulatory Visit: Payer: Self-pay | Admitting: Obstetrics

## 2018-01-17 ENCOUNTER — Other Ambulatory Visit: Payer: Self-pay | Admitting: Obstetrics

## 2018-01-30 ENCOUNTER — Encounter (HOSPITAL_COMMUNITY): Payer: Self-pay

## 2018-01-30 ENCOUNTER — Ambulatory Visit (HOSPITAL_COMMUNITY)
Admission: RE | Admit: 2018-01-30 | Discharge: 2018-01-30 | Disposition: A | Discharge status: Home / Self Care | Payer: 59 | Source: Ambulatory Visit | Attending: Obstetrics | Admitting: Obstetrics

## 2018-01-30 DIAGNOSIS — M899 Disorder of bone, unspecified: Secondary | ICD-10-CM | POA: Insufficient documentation

## 2018-01-30 MED ORDER — ZOLEDRONIC ACID 5 MG/100ML IV SOLN
INTRAVENOUS | Status: AC
  Filled 2018-01-30: qty 100

## 2018-01-30 MED ORDER — ZOLEDRONIC ACID 5 MG/100ML IV SOLN
5.0000 mg | Freq: Once | INTRAVENOUS | Status: AC
  Administered 2018-01-30: 5 mg via INTRAVENOUS

## 2018-01-30 MED ORDER — SODIUM CHLORIDE 0.9 % IV SOLN
INTRAVENOUS | Status: AC
Start: 2018-01-30 — End: 2018-01-30
  Administered 2018-01-30: 11:00:00 via INTRAVENOUS

## 2018-01-30 NOTE — Discharge Instructions (Signed)
Zoledronic Acid injection (Paget's Disease, Osteoporosis)/ Reclast °What is this medicine? °ZOLEDRONIC ACID (ZOE le dron ik AS id) lowers the amount of calcium loss from bone. It is used to treat Paget's disease and osteoporosis in women. °This medicine may be used for other purposes; ask your health care provider or pharmacist if you have questions. °COMMON BRAND NAME(S): Reclast, Zometa °What should I tell my health care provider before I take this medicine? °They need to know if you have any of these conditions: °-aspirin-sensitive asthma °-cancer, especially if you are receiving medicines used to treat cancer °-dental disease or wear dentures °-infection °-kidney disease °-low levels of calcium in the blood °-past surgery on the parathyroid gland or intestines °-receiving corticosteroids like dexamethasone or prednisone °-an unusual or allergic reaction to zoledronic acid, other medicines, foods, dyes, or preservatives °-pregnant or trying to get pregnant °-breast-feeding °How should I use this medicine? °This medicine is for infusion into a vein. It is given by a health care professional in a hospital or clinic setting. °Talk to your pediatrician regarding the use of this medicine in children. This medicine is not approved for use in children. °Overdosage: If you think you have taken too much of this medicine contact a poison control center or emergency room at once. °NOTE: This medicine is only for you. Do not share this medicine with others. °What if I miss a dose? °It is important not to miss your dose. Call your doctor or health care professional if you are unable to keep an appointment. °What may interact with this medicine? °-certain antibiotics given by injection °-NSAIDs, medicines for pain and inflammation, like ibuprofen or naproxen °-some diuretics like bumetanide, furosemide °-teriparatide °This list may not describe all possible interactions. Give your health care provider a list of all the  medicines, herbs, non-prescription drugs, or dietary supplements you use. Also tell them if you smoke, drink alcohol, or use illegal drugs. Some items may interact with your medicine. °What should I watch for while using this medicine? °Visit your doctor or health care professional for regular checkups. It may be some time before you see the benefit from this medicine. Do not stop taking your medicine unless your doctor tells you to. Your doctor may order blood tests or other tests to see how you are doing. °Women should inform their doctor if they wish to become pregnant or think they might be pregnant. There is a potential for serious side effects to an unborn child. Talk to your health care professional or pharmacist for more information. °You should make sure that you get enough calcium and vitamin D while you are taking this medicine. Discuss the foods you eat and the vitamins you take with your health care professional. °Some people who take this medicine have severe bone, joint, and/or muscle pain. This medicine may also increase your risk for jaw problems or a broken thigh bone. Tell your doctor right away if you have severe pain in your jaw, bones, joints, or muscles. Tell your doctor if you have any pain that does not go away or that gets worse. °Tell your dentist and dental surgeon that you are taking this medicine. You should not have major dental surgery while on this medicine. See your dentist to have a dental exam and fix any dental problems before starting this medicine. Take good care of your teeth while on this medicine. Make sure you see your dentist for regular follow-up appointments. °What side effects may I notice from receiving this   medicine? °Side effects that you should report to your doctor or health care professional as soon as possible: °-allergic reactions like skin rash, itching or hives, swelling of the face, lips, or tongue °-anxiety, confusion, or depression °-breathing  problems °-changes in vision °-eye pain °-feeling faint or lightheaded, falls °-jaw pain, especially after dental work °-mouth sores °-muscle cramps, stiffness, or weakness °-redness, blistering, peeling or loosening of the skin, including inside the mouth °-trouble passing urine or change in the amount of urine °Side effects that usually do not require medical attention (report to your doctor or health care professional if they continue or are bothersome): °-bone, joint, or muscle pain °-constipation °-diarrhea °-fever °-hair loss °-irritation at site where injected °-loss of appetite °-nausea, vomiting °-stomach upset °-trouble sleeping °-trouble swallowing °-weak or tired °This list may not describe all possible side effects. Call your doctor for medical advice about side effects. You may report side effects to FDA at 1-800-FDA-1088. °Where should I keep my medicine? °This drug is given in a hospital or clinic and will not be stored at home. °NOTE: This sheet is a summary. It may not cover all possible information. If you have questions about this medicine, talk to your doctor, pharmacist, or health care provider. °© 2018 Elsevier/Gold Standard (2013-07-11 14:19:57) ° °

## 2018-02-04 ENCOUNTER — Encounter (HOSPITAL_COMMUNITY): Payer: 59

## 2018-03-08 DIAGNOSIS — J069 Acute upper respiratory infection, unspecified: Secondary | ICD-10-CM | POA: Diagnosis not present

## 2018-04-01 DIAGNOSIS — J0111 Acute recurrent frontal sinusitis: Secondary | ICD-10-CM | POA: Diagnosis not present

## 2018-04-16 DIAGNOSIS — D225 Melanocytic nevi of trunk: Secondary | ICD-10-CM | POA: Diagnosis not present

## 2018-04-16 DIAGNOSIS — L309 Dermatitis, unspecified: Secondary | ICD-10-CM | POA: Diagnosis not present

## 2018-04-16 DIAGNOSIS — Z86018 Personal history of other benign neoplasm: Secondary | ICD-10-CM | POA: Diagnosis not present

## 2018-04-18 DIAGNOSIS — G4762 Sleep related leg cramps: Secondary | ICD-10-CM | POA: Diagnosis not present

## 2018-04-18 DIAGNOSIS — H8111 Benign paroxysmal vertigo, right ear: Secondary | ICD-10-CM | POA: Diagnosis not present

## 2018-04-18 DIAGNOSIS — H6591 Unspecified nonsuppurative otitis media, right ear: Secondary | ICD-10-CM | POA: Diagnosis not present

## 2018-05-04 ENCOUNTER — Other Ambulatory Visit: Payer: Self-pay | Admitting: Physician Assistant

## 2018-05-05 ENCOUNTER — Telehealth: Payer: Self-pay | Admitting: Physician Assistant

## 2018-05-05 ENCOUNTER — Other Ambulatory Visit: Payer: Self-pay

## 2018-05-05 MED ORDER — MESALAMINE 1.2 G PO TBEC: DELAYED_RELEASE_TABLET | ORAL | 3 refills | Status: DC

## 2018-05-05 NOTE — Telephone Encounter (Signed)
Pt is requesting rf [facility] [address] She just made and appt with Amy on 3/25 but will be out of pills this week.

## 2018-05-09 DIAGNOSIS — D3132 Benign neoplasm of left choroid: Secondary | ICD-10-CM | POA: Diagnosis not present

## 2018-05-15 DIAGNOSIS — H6591 Unspecified nonsuppurative otitis media, right ear: Secondary | ICD-10-CM | POA: Diagnosis not present

## 2018-05-21 ENCOUNTER — Ambulatory Visit: Payer: 59 | Admitting: Physician Assistant

## 2018-06-09 ENCOUNTER — Encounter: Payer: Self-pay | Admitting: Gastroenterology

## 2018-06-25 DIAGNOSIS — E038 Other specified hypothyroidism: Secondary | ICD-10-CM | POA: Diagnosis not present

## 2018-06-25 DIAGNOSIS — H6591 Unspecified nonsuppurative otitis media, right ear: Secondary | ICD-10-CM | POA: Diagnosis not present

## 2018-06-26 DIAGNOSIS — H6121 Impacted cerumen, right ear: Secondary | ICD-10-CM | POA: Diagnosis not present

## 2018-06-26 DIAGNOSIS — M26621 Arthralgia of right temporomandibular joint: Secondary | ICD-10-CM | POA: Diagnosis not present

## 2018-06-26 DIAGNOSIS — J343 Hypertrophy of nasal turbinates: Secondary | ICD-10-CM | POA: Diagnosis not present

## 2018-06-26 DIAGNOSIS — H9201 Otalgia, right ear: Secondary | ICD-10-CM | POA: Diagnosis not present

## 2018-07-24 ENCOUNTER — Encounter: Payer: Self-pay | Admitting: General Surgery

## 2018-07-25 ENCOUNTER — Ambulatory Visit (INDEPENDENT_AMBULATORY_CARE_PROVIDER_SITE_OTHER): Payer: 59 | Admitting: Gastroenterology

## 2018-07-25 ENCOUNTER — Other Ambulatory Visit: Payer: Self-pay

## 2018-07-25 VITALS — Ht 67.75 in | Wt 140.0 lb

## 2018-07-25 DIAGNOSIS — K5120 Ulcerative (chronic) proctitis without complications: Secondary | ICD-10-CM | POA: Diagnosis not present

## 2018-07-25 MED ORDER — MESALAMINE ER 0.375 G PO CP24: ORAL_CAPSULE | ORAL | 3 refills | Status: DC

## 2018-07-25 MED ORDER — NA SULFATE-K SULFATE-MG SULF 17.5-3.13-1.6 GM/177ML PO SOLN: 1.0000 | Freq: Once | ORAL | 0 refills | Status: AC

## 2018-07-25 NOTE — Progress Notes (Signed)
Christy [name]    [id number]    07-14-1955  Primary Care Physician:South, Annie Main, MD  Referring Physician: Reynold Bowen, MD [place], [place] [address]  This service was provided via audio and video telemedicine (Doximity) due to [place] 19 pandemic.  Patient location: Home Provider location: Office Used 2 patient identifiers to confirm the correct person. Explained the limitations in evaluation and management via telemedicine. Patient is aware of potential medical charges for this visit.  Patient consented to this virtual visit.  The persons participating in this telemedicine service were myself and the patient   Chief complaint: Ulcerative proctitis HPI:  63 year old female with history of chronic ulcerative proctitis. She is doing well overall on Lialda 2.4 g daily. Denies any nausea, vomiting, abdominal pain, change in bowel habits, decreased appetite, weight loss, melena or bright red blood per rectum. She has high deductible plan and her insurance does not cover medication until she has met all her deductible.  Currently has to pay $400 for 30-day prescription of Lialda.  Flexible sigmoidoscopy May 04, 2016 proctitis otherwise normal exam.  Biopsy showed moderately active colitis, negative for CMV.  Colonoscopy May 25, 2013 by Dr. Olevia Perches showed normal colon, random biopsies were obtained, showed benign mucosa with no evidence of inflammatory bowel disease.  Recall colonoscopy in 5 years   Outpatient Encounter Medications as of 07/25/2018  Medication Sig  . ALPRAZolam (XANAX) 1 MG tablet Take 1 mg by mouth at bedtime as needed. Take 1 1/2 tabs by mouth at bedtime   . Budesonide ER (UCERIS) 9 MG TB24 Take 9 mg by mouth daily. (Patient not taking: Reported on 07/24/2018)  . cholecalciferol (VITAMIN D) 400 units TABS tablet Take 400 Units by mouth.  . ciprofloxacin (CIPRO) 250 MG tablet Take 1 tablet (250 mg total) by mouth 2 (two) times daily.   . fish oil-omega-3 fatty acids 1000 MG capsule Take 2 g by mouth daily.  . mesalamine (LIALDA) 1.2 g EC tablet TAKE 2 TABLETS (2.4 G TOTAL) BY MOUTH 2 (TWO) TIMES DAILY.  Marland Kitchen omeprazole (PRILOSEC) 20 MG capsule Take 1 capsule (20 mg total) by mouth daily.  . Probiotic Product (ALIGN) 4 MG CAPS Take by mouth.  . SYNTHROID 150 MCG tablet Take 150 mcg by mouth daily.  . Wheat Dextrin (BENEFIBER PO) Take by mouth every morning.   Facility-Administered Encounter Medications as of 07/25/2018  Medication  . 0.9 %  sodium chloride infusion    Allergies as of 07/25/2018 - Review Complete 07/24/2018  Allergen Reaction Noted  . Adhesive [tape]  05/20/2013  . Latex Hives 05/20/2013  . Oxycodone Itching 05/09/2012  . Tetracycline    . Tranxene [clorazepate dipotassium]  09/06/2010  . Dust mite extract  01/30/2018    Past Medical History:  Diagnosis Date  . Acute peptic ulcer, unspecified site, with hemorrhage, without mention of obstruction   . Disorder of bone and cartilage, unspecified   . Fibromyalgia   . Frozen shoulder   . GERD (gastroesophageal reflux disease)   . Hyperlipidemia   . Hypertension   . Hyperthyroidism 08/2013  . Internal hemorrhoids   . Obstructive sleep apnea (adult) (pediatric)   . Osteopenia   . Other specific disorder of sleep of nonorganic origin   . PONV (postoperative nausea and vomiting)   . Restless legs syndrome (RLS)   . Tubular adenoma of colon   . Ulcerative proctitis ([place])   . Unspecified hypothyroidism   .  Vaginitis 2018  . Vitamin D deficiency     Past Surgical History:  Procedure Laterality Date  . ABDOMINAL HYSTERECTOMY    . BREAST CYST ASPIRATION Right   . CESAREAN SECTION    . COLONOSCOPY N/A 05/25/2013   Procedure: COLONOSCOPY;  Surgeon: Lafayette Dragon, MD;  Location: [facility];  Service: Endoscopy;  Laterality: N/A;  . KNEE ARTHROSCOPY Right    x 2  . LAPAROSCOPY     x 4  . NASAL SEPTUM SURGERY    . TONSILLECTOMY AND ADENOIDECTOMY       Family History  Problem Relation Age of Onset  . Colon cancer Other        grandmother  . Breast cancer Other   . Lung cancer Maternal Grandfather   . Kidney disease Mother   . Diabetes Mother   . Diabetes Father   . Melanoma Sister   . Leukemia Paternal Grandfather   . Breast cancer Other        aunt    Social History   Socioeconomic History  . Marital status: Married    Spouse name: Not on file  . Number of children: Not on file  . Years of education: Not on file  . Highest education level: Not on file  Occupational History  . Occupation: Psychologist, sport and exercise  Social Needs  . Financial resource strain: Not on file  . Food insecurity:    Worry: Not on file    Inability: Not on file  . Transportation needs:    Medical: Not on file    Non-medical: Not on file  Tobacco Use  . Smoking status: Former Smoker    Packs/day: 0.10    Years: 15.00    Pack years: 1.50    Types: Cigarettes    Last attempt to quit: 02/27/1980    Years since quitting: 38.4  . Smokeless tobacco: Never Used  Substance and Sexual Activity  . Alcohol use: No  . Drug use: No  . Sexual activity: Not on file  Lifestyle  . Physical activity:    Days per week: Not on file    Minutes per session: Not on file  . Stress: Not on file  Relationships  . Social connections:    Talks on phone: Not on file    Gets together: Not on file    Attends religious service: Not on file    Active member of club or organization: Not on file    Attends meetings of clubs or organizations: Not on file    Relationship status: Not on file  . Intimate partner violence:    Fear of current or ex partner: Not on file    Emotionally abused: Not on file    Physically abused: Not on file    Forced sexual activity: Not on file  Other Topics Concern  . Not on file  Social History Narrative  . Not on file      Review of systems: Review of Systems as per HPI All other systems reviewed and are negative.   Physical  Exam: Vitals were not taken and physical exam was not performed during this virtual visit.  Data Reviewed:  Reviewed labs, radiology imaging, old records and pertinent past GI work up   Assessment and Plan/Recommendations:  63 year old female with ulcerative proctitis, and clinical remission on Lialda Continue mesalamine, will switch to Apriso 2 capsules daily.  She has higher out-of-pocket expense with Lialda. Due for surveillance colonoscopy, schedule next available The risks and  benefits as well as alternatives of endoscopic procedure(s) have been discussed and reviewed. All questions answered. The patient agrees to proceed. Continue Benefiber 1 teaspoon 2-3 times daily  Follow-up office visit in 1 year     K. Denzil Magnuson , MD   CC: Reynold Bowen, MD

## 2018-07-25 NOTE — Patient Instructions (Addendum)
Due for surveillance colonoscopy, schedule next available  Apriso 2 capsules daily, please send prescription with refills for a year  Continue Benefiber  Follow-up visit in 1 year

## 2018-07-27 ENCOUNTER — Encounter: Payer: Self-pay | Admitting: Gastroenterology

## 2018-07-30 ENCOUNTER — Telehealth: Payer: Self-pay

## 2018-07-30 NOTE — Telephone Encounter (Signed)
Spoke with patient to ask which date she had decided to have her procedure since we had booked two spots until she was sure.  She chose 6/26 so I cancelled the appointment on 6/29.  Patient aware.  Remailed instructions.

## 2018-08-02 ENCOUNTER — Other Ambulatory Visit: Payer: Self-pay | Admitting: Physician Assistant

## 2018-08-21 ENCOUNTER — Telehealth: Payer: Self-pay | Admitting: Gastroenterology

## 2018-08-21 NOTE — Telephone Encounter (Signed)
Spoke with patient regarding covid-19 screening questions °Covid-19 Screening Questions: ° °Do you now or have you had a fever in the last 14 days? no ° °Do you have any respiratory symptoms of shortness of breath or cough now or in the last 14 days? no ° °Do you have any family members or close contacts with diagnosed or suspected Covid-19 in the past 14 days? no ° °Have you been tested for Covid-19 and found to be positive? no  ° °Pt made aware of that care partner may wait in the car or come up to the lobby during the procedure but will need to provide their own mask. °

## 2018-08-22 ENCOUNTER — Other Ambulatory Visit: Payer: Self-pay

## 2018-08-22 ENCOUNTER — Ambulatory Visit (AMBULATORY_SURGERY_CENTER): Payer: 59 | Admitting: Gastroenterology

## 2018-08-22 ENCOUNTER — Encounter: Payer: Self-pay | Admitting: Gastroenterology

## 2018-08-22 VITALS — BP 92/57 | HR 56 | Temp 97.8°F | Resp 14 | Ht 67.0 in | Wt 140.0 lb

## 2018-08-22 DIAGNOSIS — D128 Benign neoplasm of rectum: Secondary | ICD-10-CM

## 2018-08-22 DIAGNOSIS — D120 Benign neoplasm of cecum: Secondary | ICD-10-CM

## 2018-08-22 DIAGNOSIS — K51318 Ulcerative (chronic) rectosigmoiditis with other complication: Secondary | ICD-10-CM

## 2018-08-22 DIAGNOSIS — K635 Polyp of colon: Secondary | ICD-10-CM

## 2018-08-22 DIAGNOSIS — D129 Benign neoplasm of anus and anal canal: Secondary | ICD-10-CM

## 2018-08-22 DIAGNOSIS — D123 Benign neoplasm of transverse colon: Secondary | ICD-10-CM

## 2018-08-22 DIAGNOSIS — K5120 Ulcerative (chronic) proctitis without complications: Secondary | ICD-10-CM

## 2018-08-22 DIAGNOSIS — K621 Rectal polyp: Secondary | ICD-10-CM | POA: Diagnosis not present

## 2018-08-22 MED ORDER — SODIUM CHLORIDE 0.9 % IV SOLN: 500.0000 mL | Freq: Once | INTRAVENOUS | Status: DC

## 2018-08-22 NOTE — Progress Notes (Signed)
Pt drowsy. VSS. Report given to RN. No anesthetic complications noted. 

## 2018-08-22 NOTE — Progress Notes (Signed)
CW temp JB vitals

## 2018-08-22 NOTE — Progress Notes (Signed)
Called to room to assist during endoscopic procedure.  Patient ID and intended procedure confirmed with present staff. Received instructions for my participation in the procedure from the performing physician.  

## 2018-08-22 NOTE — Op Note (Signed)
[facility] Patient Name: Christy [name] Procedure Date: 08/22/2018 1:27 PM MRN: [id number] Endoscopist: Mauri Pole , MD Age: 63 Referring MD:  Date of Birth: 1956/01/29 Gender: Female Account #: [id number] Procedure:                Colonoscopy Indications:              High risk colon cancer surveillance: Ulcerative                            left sided colitis of 8 (or more) years duration Medicines:                Monitored Anesthesia Care Procedure:                Pre-Anesthesia Assessment:                           - Prior to the procedure, a History and Physical                            was performed, and patient medications and                            allergies were reviewed. The patient's tolerance of                            previous anesthesia was also reviewed. The risks                            and benefits of the procedure and the sedation                            options and risks were discussed with the patient.                            All questions were answered, and informed consent                            was obtained. Prior Anticoagulants: The patient has                            taken no previous anticoagulant or antiplatelet                            agents. ASA Grade Assessment: II - A patient with                            mild systemic disease. After reviewing the risks                            and benefits, the patient was deemed in                            satisfactory condition to undergo the procedure.  After obtaining informed consent, the colonoscope                            was passed under direct vision. Throughout the                            procedure, the patient's blood pressure, pulse, and                            oxygen saturations were monitored continuously. The                            Colonoscope was introduced through the anus and                            advanced  to the the cecum, identified by                            appendiceal orifice and ileocecal valve. The                            colonoscopy was performed without difficulty. The                            patient tolerated the procedure well. The quality                            of the bowel preparation was excellent. The                            ileocecal valve, appendiceal orifice, and rectum                            were photographed. Scope In: 1:34:08 PM Scope Out: 2:02:07 PM Scope Withdrawal Time: 0 hours 10 minutes 58 seconds  Total Procedure Duration: 0 hours 27 minutes 59 seconds  Findings:                 The perianal and digital rectal examinations were                            normal.                           Four sessile polyps were found in the rectum and                            cecum. The polyps were 1 to 2 mm in size. These                            polyps were removed with a cold biopsy forceps.                            Resection and retrieval were complete.  A 5 mm polyp was found in the transverse colon. The                            polyp was sessile. The polyp was removed with a                            cold snare. Resection and retrieval were complete.                           Non-bleeding internal hemorrhoids were found during                            retroflexion. The hemorrhoids were small.                           Normal mucosa was found in the entire colon. Complications:            No immediate complications. Estimated Blood Loss:     Estimated blood loss was minimal. Impression:               - Four 1 to 2 mm polyps in the rectum and in the                            cecum, removed with a cold biopsy forceps. Resected                            and retrieved.                           - One 5 mm polyp in the transverse colon, removed                            with a cold snare. Resected and retrieved.                            - Non-bleeding internal hemorrhoids.                           - Normal mucosa in the entire examined colon. Recommendation:           - Patient has a contact number available for                            emergencies. The signs and symptoms of potential                            delayed complications were discussed with the                            patient. Return to normal activities tomorrow.                            Written discharge instructions were provided to the  patient.                           - Resume previous diet.                           - Continue present medications.                           - Await pathology results.                           - Repeat colonoscopy in 3 years for surveillance                            based on pathology results. Mauri Pole, MD 08/22/2018 2:11:32 PM This report has been signed electronically.

## 2018-08-22 NOTE — Patient Instructions (Signed)
YOU HAD AN ENDOSCOPIC PROCEDURE TODAY AT THE [facility]:   Refer to the procedure report that was given to you for any specific questions about what was found during the examination.  If the procedure report does not answer your questions, please call your gastroenterologist to clarify.  If you requested that your care partner not be given the details of your procedure findings, then the procedure report has been included in a sealed envelope for you to review at your convenience later.  YOU SHOULD EXPECT: Some feelings of bloating in the abdomen. Passage of more gas than usual.  Walking can help get rid of the air that was put into your GI tract during the procedure and reduce the bloating. If you had a lower endoscopy (such as a colonoscopy or flexible sigmoidoscopy) you may notice spotting of blood in your stool or on the toilet paper. If you underwent a bowel prep for your procedure, you may not have a normal bowel movement for a few days.  Please Note:  You might notice some irritation and congestion in your nose or some drainage.  This is from the oxygen used during your procedure.  There is no need for concern and it should clear up in a day or so.  SYMPTOMS TO REPORT IMMEDIATELY:   Following lower endoscopy (colonoscopy or flexible sigmoidoscopy):  Excessive amounts of blood in the stool  Significant tenderness or worsening of abdominal pains  Swelling of the abdomen that is new, acute  Fever of 100F or higher   For urgent or emergent issues, a gastroenterologist can be reached at any hour by calling [phone].   DIET:  We do recommend a small meal at first, but then you may proceed to your regular diet.  Drink plenty of fluids but you should avoid alcoholic beverages for 24 hours.  MEDICATIONS: Continue present medications.  Please see handouts given to you by your recovery nurse.  ACTIVITY:  You should plan to take it easy for the rest of today and you should  NOT DRIVE or use heavy machinery until tomorrow (because of the sedation medicines used during the test).    FOLLOW UP: Our staff will call the number listed on your records 48-72 hours following your procedure to check on you and address any questions or concerns that you may have regarding the information given to you following your procedure. If we do not reach you, we will leave a message.  We will attempt to reach you two times.  During this call, we will ask if you have developed any symptoms of COVID 19. If you develop any symptoms (ie: fever, flu-like symptoms, shortness of breath, cough etc.) before then, please call [phone].  If you test positive for Covid 19 in the 2 weeks post procedure, please call and report this information to us.    If any biopsies were taken you will be contacted by phone or by letter within the next 1-3 weeks.  Please call us at [phone] if you have not heard about the biopsies in 3 weeks.   Thank you for allowing us to provide for your healthcare needs today.   SIGNATURES/CONFIDENTIALITY: You and/or your care partner have signed paperwork which will be entered into your electronic medical record.  These signatures attest to the fact that that the information above on your After Visit Summary has been reviewed and is understood.  Full responsibility of the confidentiality of this discharge information lies with you and/or   your care-partner. 

## 2018-08-25 ENCOUNTER — Encounter: Payer: 59 | Admitting: Gastroenterology

## 2018-08-26 ENCOUNTER — Telehealth: Payer: Self-pay

## 2018-08-26 NOTE — Telephone Encounter (Signed)
  Follow up Call-  Call back number 08/22/2018 05/04/2016  Post procedure Call Back phone  # [phone]- [phone]  Permission to leave phone message Yes Yes  Some recent data might be hidden     Patient questions:  Do you have a fever, pain , or abdominal swelling? No. Pain Score  0 *  Have you tolerated food without any problems? Yes.    Have you been able to return to your normal activities? Yes.    Do you have any questions about your discharge instructions: Diet   No. Medications  No. Follow up visit  No.  Do you have questions or concerns about your Care? No.  Actions: * If pain score is 4 or above: No action needed, pain <4.  1. Have you developed a fever since your procedure? no  2.   Have you had an respiratory symptoms (SOB or cough) since your procedure? no  3.   Have you tested positive for COVID 19 since your procedure no  4.   Have you had any family members/close contacts diagnosed with the COVID 19 since your procedure?  no   If yes to any of these questions please route to Joylene John, RN and Alphonsa Gin, Therapist, sports.

## 2018-09-05 ENCOUNTER — Encounter: Payer: Self-pay | Admitting: Gastroenterology

## 2018-09-12 ENCOUNTER — Encounter: Payer: Self-pay | Admitting: Gastroenterology

## 2018-09-16 ENCOUNTER — Encounter: Payer: Self-pay | Admitting: Gastroenterology

## 2018-11-28 DIAGNOSIS — Z23 Encounter for immunization: Secondary | ICD-10-CM | POA: Diagnosis not present

## 2019-01-14 DIAGNOSIS — G4733 Obstructive sleep apnea (adult) (pediatric): Secondary | ICD-10-CM | POA: Diagnosis not present

## 2019-01-14 DIAGNOSIS — E559 Vitamin D deficiency, unspecified: Secondary | ICD-10-CM | POA: Diagnosis not present

## 2019-01-14 DIAGNOSIS — E785 Hyperlipidemia, unspecified: Secondary | ICD-10-CM | POA: Diagnosis not present

## 2019-01-14 DIAGNOSIS — E039 Hypothyroidism, unspecified: Secondary | ICD-10-CM | POA: Diagnosis not present

## 2019-01-14 DIAGNOSIS — M8580 Other specified disorders of bone density and structure, unspecified site: Secondary | ICD-10-CM | POA: Diagnosis not present

## 2019-01-14 DIAGNOSIS — F5104 Psychophysiologic insomnia: Secondary | ICD-10-CM | POA: Diagnosis not present

## 2019-04-22 DIAGNOSIS — B351 Tinea unguium: Secondary | ICD-10-CM | POA: Diagnosis not present

## 2019-04-22 DIAGNOSIS — L821 Other seborrheic keratosis: Secondary | ICD-10-CM | POA: Diagnosis not present

## 2019-04-22 DIAGNOSIS — D225 Melanocytic nevi of trunk: Secondary | ICD-10-CM | POA: Diagnosis not present

## 2019-04-22 DIAGNOSIS — L219 Seborrheic dermatitis, unspecified: Secondary | ICD-10-CM | POA: Diagnosis not present

## 2019-05-22 DIAGNOSIS — L820 Inflamed seborrheic keratosis: Secondary | ICD-10-CM | POA: Diagnosis not present

## 2019-06-11 ENCOUNTER — Ambulatory Visit (INDEPENDENT_AMBULATORY_CARE_PROVIDER_SITE_OTHER): Payer: BC Managed Care – PPO | Admitting: Psychiatry

## 2019-06-11 DIAGNOSIS — F411 Generalized anxiety disorder: Secondary | ICD-10-CM | POA: Diagnosis not present

## 2019-06-11 NOTE — Progress Notes (Signed)
[facility] Counsel[facility] Initial Adult Exam  Name: Christy [name] Date: 06/11/2019 MRN: [id number] DOB: 1955-08-19 PCP: Reynold Bowen, MD  Time spent: 60 minutes  3:00pm to 4:00pm   Virtual Visit Note Connected with patient by a video enabled telemedicine/telehealth application or telephone, with their informed consent, and verified patient privacy and that I am speaking with the correct person using two identifiers. I discussed the limitations, risks, security and privacy concerns of performing psychotherapy and management service by telephone and the availability of in person appointments. I also discussed with the patient that there may be a patient responsible charge related to this service. The patient expressed understanding and agreed to proceed. I discussed the treatment planning with the patient. The patient was provided an opportunity to ask questions and all were answered. The patient agreed with the plan and demonstrated an understanding of the instructions. The patient was advised to call  our office if  symptoms worsen or feel they are in a crisis state and need immediate contact.   Therapist Location: [facility] Patient Location: home  Guardian/Payee:  Patient    Paperwork requested:  No   Reason for Visit /Presenting Problem/Symptoms:  States "I don't have anybody to talk to and feel so alone with all my concerns." anxiety, depression, sadness, taking 1 mg Xanax at night for sleep per her PCP.  Patient reports increased anxiety over several issues: Pandemic has been very difficult for her, the social isolation, and increased anxiety. Problems/concerns with her 2 young adult daughters Joellen Jersey, Raquel Sarna) Emily's wedding has had to be rescheduled twice due to pandemic. Katie's health issues. Issues with husband. Communication issues. Family concerns (not all details shared here due to patient privacy needs)   Mental Status Exam:   Appearance:   n/a   telehealth      Behavior:  Appropriate, Sharing and Motivated  Motor:  n/a  telehealth  Speech/Language:   Normal Rate  Affect:  n/a  telehealth  Mood:  anxious, depressed and sad  Thought process:  goal directed  Thought content:    WNL  Sensory/Perceptual disturbances:    WNL  Orientation:  oriented to person, place, time/date, situation, day of week, month of year and year  Attention:  Good/Fair  Concentration:  Good/Fair  Memory:  some forgetfulness  Fund of knowledge:   Good  Insight:    Good  Judgment:   Good  Impulse Control:  Good   Reported Symptoms:  See above.  Risk Assessment: Danger to Self:  No Self-injurious Behavior: No Danger to Others: No Duty to Warn:no Physical Aggression / Violence:No  Access to Firearms a concern: No  Gang Involvement:Yes  Patient / guardian was educated about steps to take if suicide or homicide risk level increases between visits: Patient denies any SI or HI. While future psychiatric events cannot be accurately predicted, the patient does not currently require acute inpatient psychiatric care and does not currently meet [facility] involuntary commitment criteria.  Substance Abuse History: Current substance abuse: No     Past Psychiatric History:   Previous psychological history is significant for anxiety and depression Outpatient Providers: Dr. Nancy Marus History of Psych Hospitalization: No  Psychological Testing: none   Abuse History: Victim of No., n/a   Report needed: No. Victim of Neglect:No. Perpetrator of n/a  Witness / Exposure to Domestic Violence: No   Protective Services Involvement: No  Witness to [employer] Violence:  No   Family History: Reviewed with patient and she confirmed info below  Family History  Problem Relation Age of Onset  . Colon cancer Other        grandmother  . Breast cancer Other   . Lung cancer Maternal Grandfather   . Kidney disease Mother   . Diabetes Mother   . Diabetes Father   . Melanoma  Sister   . Leukemia Paternal Grandfather   . Breast cancer Other        aunt  . Colon cancer Paternal Grandmother   . Colon polyps Neg Hx   . Esophageal cancer Neg Hx   . Stomach cancer Neg Hx   . Rectal cancer Neg Hx     Living situation: the patient lives with their spouse "Bud"  Sexual Orientation:  Straight  Relationship Status: married  Name of spouse / other: "Bud"             If a parent, number of children / ages: 2 daughters, Raquel Sarna, Katie  Support Systems; spouse friends  Museum/gallery curator Stress:  No   Income/Employment/Disability: Employment  Armed forces logistics/support/administrative officer: No   Educational History: Education: Museum/gallery exhibitions officer:   Protestant  Any cultural differences that may affect / interfere with treatment:  not applicable   Recreation/Hobbies: reading, music, family acitivities  Stressors:Other: family concerns, issues with her 2 daughters  Strengths:  Supportive Relationships, Family, Church, Spirituality, Hopefulness, Conservator, museum/gallery and Able to Communicate Effectively  Barriers:  communication issues  Legal History: Pending legal issue / charges: The patient has no significant history of legal issues. History of legal issue / charges: n/a  Medical History/Surgical History: Reviewed with patient and she confirms info below. Past Medical History:  Diagnosis Date  . Acute peptic ulcer, unspecified site, with hemorrhage, without mention of obstruction   . Anxiety   . Disorder of bone and cartilage, unspecified   . Fibromyalgia   . Frozen shoulder   . GERD (gastroesophageal reflux disease)   . Hyperlipidemia   . Hypertension    07/2018- pt states no HTN since losing over 70#  . Hyperthyroidism 08/2013  . Internal hemorrhoids   . Obstructive sleep apnea (adult) (pediatric)   . Osteopenia   . Other specific disorder of sleep of nonorganic origin   . PONV (postoperative nausea and vomiting)   . Restless legs syndrome (RLS)   . Sleep  apnea    07/2018 pt states surgery to correct but states she still has sleep apnea, just not as bad  . Tubular adenoma of colon   . Ulcerative proctitis ([place])   . Unspecified hypothyroidism   . Vaginitis 2018  . Vitamin D deficiency     Past Surgical History:  Procedure Laterality Date  . ABDOMINAL HYSTERECTOMY    . BREAST CYST ASPIRATION Right   . CESAREAN SECTION    . COLONOSCOPY N/A 05/25/2013   Procedure: COLONOSCOPY;  Surgeon: Lafayette Dragon, MD;  Location: [facility];  Service: Endoscopy;  Laterality: N/A;  . COLONOSCOPY    . KNEE ARTHROSCOPY Right    x 2  . LAPAROSCOPY     x 4  . NASAL SEPTUM SURGERY    . SIGMOIDOSCOPY    . TONSILLECTOMY AND ADENOIDECTOMY    . UPPER GASTROINTESTINAL ENDOSCOPY      Medications:  Reviewed with patient and she confirms info below. Current Outpatient Medications  Medication Sig Dispense Refill  . cholecalciferol (VITAMIN D) 400 units TABS tablet Take 400 Units by mouth.    . fish oil-omega-3 fatty acids 1000 MG capsule Take 2  g by mouth daily.    Marland Kitchen loratadine (CLARITIN) 10 MG tablet Take 10 mg by mouth.    . mesalamine (APRISO) 0.375 g 24 hr capsule Take 2 capsules by mouth daily 180 capsule 3  . mesalamine (LIALDA) 1.2 g EC tablet TAKE 2 TABLETS (2.4 G TOTAL) BY MOUTH 2 (TWO) TIMES DAILY. (Patient not taking: Reported on 08/22/2018) 360 tablet 1  . omeprazole (PRILOSEC) 20 MG capsule Take 1 capsule (20 mg total) by mouth daily. 30 capsule 6  . PREMARIN vaginal cream Place 0.5 g vaginally 2 (two) times a week.    . Probiotic Product (ALIGN) 4 MG CAPS Take by mouth.    . SYNTHROID 150 MCG tablet Take 150 mcg by mouth daily.  9  . Wheat Dextrin (BENEFIBER PO) Take by mouth every morning.     Current Facility-Administered Medications  Medication Dose Route Frequency Provider Last Rate Last Admin  . 0.9 %  sodium chloride infusion  500 mL Intravenous Once Nandigam, Venia Minks, MD        Allergies  Allergen Reactions  . Adhesive [Tape]   .  Latex Hives  . Oxycodone Itching  . Tetracycline     REACTION: diarrhea  . Tranxene [Clorazepate Dipotassium]     itche  . Dust Mite Extract     And mold    Diagnoses:    ICD-10-CM   1. Generalized anxiety disorder  F41.1     Plan of Care:  Patient not signing Treatment Plan on computer screen due to Covid.  Treatment goals: Goals remain on Tx Plan as patient works on strategies to achieve her goals.  Progress is documented each session in the "Progress" section of Tx Plan.  Long Term Goal: Reduce overall level, frequency, and intensity of the anxiety so that daily functioning is not impaired.  Short Term Goal: Increase understanding of beliefs and messages that produce the worry and anxiety.  Strategy: Identify, challenge, and replace anxious/fearful/depressive thoughts and self-talk with more positive, reality-based and empowering thoughts and self-talk that does not support anxiety nor depression.  Progress: This first session today included completing the initial evaluation with patient and then working together to formulate her treatment goal plan.  She is motivated and wanting changes in her life.  She is to do some documentation and monitoring homework before next session re: anxious/negative/depressive thoughts and self-talk .  Will follow up next session.  Next appt to be within 2 weeks.   Shanon Ace, LCSW

## 2019-07-01 DIAGNOSIS — Z1389 Encounter for screening for other disorder: Secondary | ICD-10-CM | POA: Diagnosis not present

## 2019-07-01 DIAGNOSIS — E7849 Other hyperlipidemia: Secondary | ICD-10-CM | POA: Diagnosis not present

## 2019-07-01 DIAGNOSIS — K5190 Ulcerative colitis, unspecified, without complications: Secondary | ICD-10-CM | POA: Diagnosis not present

## 2019-07-01 DIAGNOSIS — M797 Fibromyalgia: Secondary | ICD-10-CM | POA: Diagnosis not present

## 2019-07-01 DIAGNOSIS — E038 Other specified hypothyroidism: Secondary | ICD-10-CM | POA: Diagnosis not present

## 2019-07-08 ENCOUNTER — Ambulatory Visit (INDEPENDENT_AMBULATORY_CARE_PROVIDER_SITE_OTHER): Payer: BC Managed Care – PPO | Admitting: Psychiatry

## 2019-07-08 DIAGNOSIS — F411 Generalized anxiety disorder: Secondary | ICD-10-CM

## 2019-07-08 NOTE — Progress Notes (Signed)
[facility] Counselor/Therapist Progress Note  Patient ID: Christy [name], MRN: [id number],    Date: 07/08/2019  Time Spent: 60 minutes   10:00am to 11:00am  Virtual Visit Note Connected with patient by a video enabled telemedicine/telehealth application or telephone, with their informed consent, and verified patient privacy and that I am speaking with the correct person using two identifiers. I discussed the limitations, risks, security and privacy concerns of performing psychotherapy and management service by telephone and the availability of in person appointments. I also discussed with the patient that there may be a patient responsible charge related to this service. The patient expressed understanding and agreed to proceed. I discussed the treatment planning with the patient. The patient was provided an opportunity to ask questions and all were answered. The patient agreed with the plan and demonstrated an understanding of the instructions. The patient was advised to call  our office if  symptoms worsen or feel they are in a crisis state and need immediate contact.   Therapist Location: [facility] Patient Location: home   Treatment Type: Individual Therapy  Reported Symptoms: anxiety, stressed, some concerns with husband  Mental Status Exam:  Appearance:   n/a   telehealth     Behavior:  Sharing  Motor:  n/a  telehealth  Speech/Language:   Normal Rate  Affect:  anxious  Mood:  anxious (but much less)   Thought process:  normal  Thought content:    WNL  Sensory/Perceptual disturbances:    WNL  Orientation:  oriented to person, place, time/date, situation, day of week, month of year and year  Attention:  Good  Concentration:  Good  Memory:  some forgetfulness but not excessive  Fund of knowledge:   Good  Insight:    Good  Judgment:   Good  Impulse Control:  Good   Risk Assessment: Danger to Self:  No Self-injurious Behavior: No Danger to Others:  No Duty to Warn:no Physical Aggression / Violence:No  Access to Firearms a concern: No  Gang Involvement:No   Subjective: Patient today reports less anxiety, but it has"ecreased a good bit".  Feel "more clear-headed".  "Not much sadness now.". Communication/relationship with husband improving, "feeling more close." Has been stressed, and that is decreasing steadily  now after she has gotten through several emotional hurdles. Has been really good at doing "therapy suggestions and homework" with benefits noted in her mood and outlook. Is stressed some with daughter's upcoming wedding which she feels is "normal".  Interventions: Cognitive Behavioral Therapy and Ego-Supportive  Diagnosis:   ICD-10-CM   1. Generalized anxiety disorder  F41.1      Plan of Care:  Patient not signing Treatment Plan on computer screen due to Covid.  Treatment goals: Goals remain on Tx Plan as patient works on strategies to achieve her goals.  Progress is documented each session in the "Progress" section of Tx Plan.  Long Term Goal: Reduce overall level, frequency, and intensity of the anxiety so that daily functioning is not impaired.  Short Term Goal: Increase understanding of beliefs and messages that produce the worry and anxiety.  Strategy: Identify, challenge, and replace anxious/fearful/depressive thoughts and self-talk with more positive, reality-based and empowering thoughts and self-talk that does not support anxiety nor depression.  Progress: Patient today reporting anxiety, and it is decreasing more.  Not feeling as stressed personally but is stressed about upcoming wedding for her daughter. Less sadness and more "clear-headed". Marital relationship becoming more comfortable.  Coping with  anxiety and uncertainties significantly better. Patient has daughter's wedding and multiple other things coming up on her schedule.  With her progress, she is going to keep working on her goals/strategies and  will call back for appt when needed. She has been very motivated and was eager to follow through on all suggestions and her progress shows, and is hopeful she keeps moving in positive direction.     Goal review and significant steps taken by patient and progress noted with her today.  To schedule later as needed.     Shanon Ace, LCSW

## 2019-09-04 DIAGNOSIS — M859 Disorder of bone density and structure, unspecified: Secondary | ICD-10-CM | POA: Diagnosis not present

## 2019-09-10 ENCOUNTER — Other Ambulatory Visit: Payer: Self-pay | Admitting: Obstetrics

## 2019-09-16 ENCOUNTER — Ambulatory Visit (HOSPITAL_COMMUNITY)
Admission: RE | Admit: 2019-09-16 | Discharge: 2019-09-16 | Disposition: A | Discharge status: Home / Self Care | Payer: BC Managed Care – PPO | Source: Ambulatory Visit | Attending: Obstetrics | Admitting: Obstetrics

## 2019-09-16 ENCOUNTER — Other Ambulatory Visit: Payer: Self-pay

## 2019-09-16 DIAGNOSIS — M859 Disorder of bone density and structure, unspecified: Secondary | ICD-10-CM | POA: Insufficient documentation

## 2019-09-16 MED ORDER — ZOLEDRONIC ACID 5 MG/100ML IV SOLN: 5.0000 mg | Freq: Once | INTRAVENOUS | Status: AC

## 2019-09-16 MED ORDER — ZOLEDRONIC ACID 5 MG/100ML IV SOLN
INTRAVENOUS | Status: AC
  Administered 2019-09-16: 5 mg via INTRAVENOUS
  Filled 2019-09-16: qty 100

## 2019-10-21 ENCOUNTER — Other Ambulatory Visit: Payer: Self-pay | Admitting: Gastroenterology

## 2019-10-21 DIAGNOSIS — Z1231 Encounter for screening mammogram for malignant neoplasm of breast: Secondary | ICD-10-CM | POA: Diagnosis not present

## 2019-10-21 DIAGNOSIS — Z6820 Body mass index (BMI) 20.0-20.9, adult: Secondary | ICD-10-CM | POA: Diagnosis not present

## 2019-10-21 DIAGNOSIS — Z01419 Encounter for gynecological examination (general) (routine) without abnormal findings: Secondary | ICD-10-CM | POA: Diagnosis not present

## 2019-11-07 ENCOUNTER — Encounter: Payer: Self-pay | Admitting: Emergency Medicine

## 2019-11-07 ENCOUNTER — Ambulatory Visit
Admission: EM | Admit: 2019-11-07 | Discharge: 2019-11-07 | Disposition: A | Discharge status: Home / Self Care | Payer: BC Managed Care – PPO | Attending: Emergency Medicine | Admitting: Emergency Medicine

## 2019-11-07 ENCOUNTER — Other Ambulatory Visit: Payer: Self-pay

## 2019-11-07 DIAGNOSIS — H60391 Other infective otitis externa, right ear: Secondary | ICD-10-CM

## 2019-11-07 MED ORDER — NEOMYCIN-POLYMYXIN-HC 3.5-10000-1 OT SUSP: 3.0000 [drp] | Freq: Three times a day (TID) | OTIC | 0 refills | Status: DC

## 2019-11-07 NOTE — ED Triage Notes (Signed)
Pt here for right ear pain x 2 weeks

## 2019-11-07 NOTE — Discharge Instructions (Addendum)
Use eardrops as prescribed for the next week. Return for worsening ear pain, swelling, discharge, bleeding, decreased hearing, development of jaw pain/swelling, fever.  Do NOT use Q-tips as these can cause your ear wax to get stuck, the tips may break off and become a foreign body requiring additional medical care, or puncture your eardrum.  Helpful prevention tip: Use a solution of equal parts isopropyl (rubbing) alcohol and white vinegar (acetic acid) in both ears after swimming. 

## 2019-11-07 NOTE — ED Provider Notes (Signed)
[facility]    CSN: [id number] Arrival date & time: 11/07/19  1219      History   Chief Complaint Chief Complaint  Patient presents with  . Otalgia    HPI Christy [name] is a 64 y.o. female  Presenting for evaluation of right ear pain.  Patient relates history: States is been ongoing for about 2 weeks.  No cerumen impaction, discharge, foreign body exposure sensation, trauma, fever, nasal congestion or sore throat.  Patient does endorse right-sided lymphadenopathy that is tender.  Has tried OTC analgesia without relief.  Past Medical History:  Diagnosis Date  . Acute peptic ulcer, unspecified site, with hemorrhage, without mention of obstruction   . Anxiety   . Disorder of bone and cartilage, unspecified   . Fibromyalgia   . Frozen shoulder   . GERD (gastroesophageal reflux disease)   . Hyperlipidemia   . Hypertension    07/2018- pt states no HTN since losing over 70#  . Hyperthyroidism 08/2013  . Internal hemorrhoids   . Obstructive sleep apnea (adult) (pediatric)   . Osteopenia   . Other specific disorder of sleep of nonorganic origin   . PONV (postoperative nausea and vomiting)   . Restless legs syndrome (RLS)   . Sleep apnea    07/2018 pt states surgery to correct but states she still has sleep apnea, just not as bad  . Tubular adenoma of colon   . Ulcerative proctitis ([place])   . Unspecified hypothyroidism   . Vaginitis 2018  . Vitamin D deficiency     Patient Active Problem List   Diagnosis Date Noted  . DIARRHEA 03/18/2009  . HYPERSOMNIA 01/28/2009  . ULCERATIVE PROCTITIS 05/05/2008  . FIBROMYALGIA 03/16/2008  . RECTAL BLEEDING 02/13/2008  . PERSONAL HX COLONIC POLYPS 02/13/2008  . INADEQUATE SLEEP HYGIENE 12/03/2007  . COLONIC POLYPS 11/05/2007  . HYPOTHYROIDISM 11/05/2007  . VITAMIN D DEFICIENCY 11/05/2007  . HYPERLIPIDEMIA 11/05/2007  . RESTLESS LEG SYNDROME 11/05/2007  . HYPERTENSION 11/05/2007  . GERD 11/05/2007  . CONSTIPATION  11/05/2007  . IBS 11/05/2007  . OSTEOPENIA 11/05/2007  . ACUT PEPTC ULCR UNS SITE W/HEM W/O MENTION OBST 08/01/2007    Past Surgical History:  Procedure Laterality Date  . ABDOMINAL HYSTERECTOMY    . BREAST CYST ASPIRATION Right   . CESAREAN SECTION    . COLONOSCOPY N/A 05/25/2013   Procedure: COLONOSCOPY;  Surgeon: Lafayette Dragon, MD;  Location: [facility];  Service: Endoscopy;  Laterality: N/A;  . COLONOSCOPY    . KNEE ARTHROSCOPY Right    x 2  . LAPAROSCOPY     x 4  . NASAL SEPTUM SURGERY    . SIGMOIDOSCOPY    . TONSILLECTOMY AND ADENOIDECTOMY    . UPPER GASTROINTESTINAL ENDOSCOPY      OB History   No obstetric history on file.      Home Medications    Prior to Admission medications   Medication Sig Start Date End Date Taking? Authorizing Provider  cholecalciferol (VITAMIN D) 400 units TABS tablet Take 400 Units by mouth.    [provider]  fish oil-omega-3 fatty acids 1000 MG capsule Take 2 g by mouth daily.    [provider]  loratadine (CLARITIN) 10 MG tablet Take 10 mg by mouth.    [provider]  mesalamine (APRISO) 0.375 g 24 hr capsule TAKE 2 CAPSULES BY MOUTH EVERY DAY 10/21/19   Mauri Pole, MD  mesalamine (LIALDA) 1.2 g EC tablet TAKE 2 TABLETS (  2.4 G TOTAL) BY MOUTH 2 (TWO) TIMES DAILY. Patient not taking: Reported on 08/22/2018 08/04/18   Irene Shipper, MD  neomycin-polymyxin-hydrocortisone (CORTISPORIN) 3.5-10000-1 OTIC suspension Place 3 drops into the right ear 3 (three) times daily. 11/07/19   Hall-Potvin, [place], PA-C  omeprazole (PRILOSEC) 20 MG capsule Take 1 capsule (20 mg total) by mouth daily. 10/19/16   Esterwood, Amy S, PA-C  PREMARIN vaginal cream Place 0.5 g vaginally 2 (two) times a week. 06/05/18   [provider]  Probiotic Product (ALIGN) 4 MG CAPS Take by mouth.    [provider]  SYNTHROID 150 MCG tablet Take 150 mcg by mouth daily. 03/17/14   [provider]  Wheat Dextrin  (BENEFIBER PO) Take by mouth every morning.    [provider]    Family History Family History  Problem Relation Age of Onset  . Colon cancer Other        grandmother  . Breast cancer Other   . Lung cancer Maternal Grandfather   . Kidney disease Mother   . Diabetes Mother   . Diabetes Father   . Melanoma Sister   . Leukemia Paternal Grandfather   . Breast cancer Other        aunt  . Colon cancer Paternal Grandmother   . Colon polyps Neg Hx   . Esophageal cancer Neg Hx   . Stomach cancer Neg Hx   . Rectal cancer Neg Hx     Social History Social History   Tobacco Use  . Smoking status: Former Smoker    Packs/day: 0.10    Years: 15.00    Pack years: 1.50    Types: Cigarettes    Quit date: 02/27/1980    Years since quitting: 39.7  . Smokeless tobacco: Never Used  Vaping Use  . Vaping Use: Never used  Substance Use Topics  . Alcohol use: No  . Drug use: No     Allergies   Adhesive [tape], Latex, Oxycodone, Tetracycline, Tranxene [clorazepate dipotassium], and Dust mite extract   Review of Systems As per HPI   Physical Exam Triage Vital Signs ED Triage Vitals  Enc Vitals Group     BP 11/07/19 1331 (!) 115/56     Pulse Rate 11/07/19 1331 60     Resp 11/07/19 1331 16     Temp 11/07/19 1331 98.2 F (36.8 C)     Temp Source 11/07/19 1331 Temporal     SpO2 11/07/19 1331 96 %     Weight --      Height --      Head Circumference --      Peak Flow --      Pain Score 11/07/19 1327 5     Pain Loc --      Pain Edu? --      Excl. in [place]? --    No data found.  Updated Vital Signs BP (!) 115/56 (BP Location: Right Arm)   Pulse 60   Temp 98.2 F (36.8 C) (Temporal)   Resp 16   SpO2 96%   Visual Acuity Right Eye Distance:   Left Eye Distance:   Bilateral Distance:    Right Eye Near:   Left Eye Near:    Bilateral Near:     Physical Exam Constitutional:      General: She is not in acute distress. HENT:     Head: Normocephalic and  atraumatic.     Right Ear: Tympanic membrane and external ear normal.  Left Ear: Tympanic membrane, ear canal and external ear normal.     Ears:     Comments: Mild tragal tenderness of right ear.  EAC mildly edematous with injection.  No discharge or foreign body, cerumen impaction. Eyes:     General: No scleral icterus.    Pupils: Pupils are equal, round, and reactive to light.  Cardiovascular:     Rate and Rhythm: Normal rate.  Pulmonary:     Effort: Pulmonary effort is normal.  Skin:    Coloration: Skin is not jaundiced or pale.  Neurological:     Mental Status: She is alert and oriented to person, place, and time.      UC Treatments / Results  Labs (all labs ordered are listed, but only abnormal results are displayed) Labs Reviewed - No data to display  EKG   Radiology No results found.  Procedures Procedures (including critical care time)  Medications Ordered in UC Medications - No data to display  Initial Impression / Assessment and Plan / UC Course  I have reviewed the triage vital signs and the nursing notes.  Pertinent labs & imaging results that were available during my care of the patient were reviewed by me and considered in my medical decision making (see chart for details).     Patient has 2-week course of right ear pain with EAC swelling and erythema.  Will cover for infective otitis externa as outlined below.  Return precautions discussed, pt verbalized understanding and is agreeable to plan. Final Clinical Impressions(s) / UC Diagnoses   Final diagnoses:  Infective otitis externa of right ear     Discharge Instructions     Use eardrops as prescribed for the next week. Return for worsening ear pain, swelling, discharge, bleeding, decreased hearing, development of jaw pain/swelling, fever.  Do NOT use Q-tips as these can cause your ear wax to get stuck, the tips may break off and become a foreign body requiring additional medical care, or  puncture your eardrum.  Helpful prevention tip: Use a solution of equal parts isopropyl (rubbing) alcohol and white vinegar (acetic acid) in both ears after swimming.    ED Prescriptions    Medication Sig Dispense Auth. Provider   neomycin-polymyxin-hydrocortisone (CORTISPORIN) 3.5-10000-1 OTIC suspension Place 3 drops into the right ear 3 (three) times daily. 10 mL Hall-Potvin, [place], PA-C     PDMP not reviewed this encounter.   Hall-Potvin, [place], [place] 11/07/19 1426

## 2019-12-30 DIAGNOSIS — E039 Hypothyroidism, unspecified: Secondary | ICD-10-CM | POA: Diagnosis not present

## 2019-12-30 DIAGNOSIS — E785 Hyperlipidemia, unspecified: Secondary | ICD-10-CM | POA: Diagnosis not present

## 2019-12-30 DIAGNOSIS — Z23 Encounter for immunization: Secondary | ICD-10-CM | POA: Diagnosis not present

## 2019-12-30 DIAGNOSIS — E559 Vitamin D deficiency, unspecified: Secondary | ICD-10-CM | POA: Diagnosis not present

## 2020-01-12 DIAGNOSIS — R42 Dizziness and giddiness: Secondary | ICD-10-CM | POA: Diagnosis not present

## 2020-01-12 DIAGNOSIS — M2669 Other specified disorders of temporomandibular joint: Secondary | ICD-10-CM | POA: Diagnosis not present

## 2020-01-12 DIAGNOSIS — H9201 Otalgia, right ear: Secondary | ICD-10-CM | POA: Diagnosis not present

## 2020-01-19 ENCOUNTER — Encounter: Payer: Self-pay | Admitting: Physical Therapy

## 2020-01-19 ENCOUNTER — Ambulatory Visit: Payer: BC Managed Care – PPO | Attending: Physician Assistant | Admitting: Physical Therapy

## 2020-01-19 ENCOUNTER — Other Ambulatory Visit: Payer: Self-pay

## 2020-01-19 DIAGNOSIS — H8111 Benign paroxysmal vertigo, right ear: Secondary | ICD-10-CM | POA: Diagnosis not present

## 2020-01-19 DIAGNOSIS — R2681 Unsteadiness on feet: Secondary | ICD-10-CM | POA: Insufficient documentation

## 2020-01-19 DIAGNOSIS — R42 Dizziness and giddiness: Secondary | ICD-10-CM | POA: Diagnosis not present

## 2020-01-19 DIAGNOSIS — H938X1 Other specified disorders of right ear: Secondary | ICD-10-CM | POA: Diagnosis not present

## 2020-01-19 NOTE — Therapy (Addendum)
[facility] [address] [place], [place], [address] Phone: [phone]   Fax:  [phone]  Physical Therapy Evaluation and Discharge  Patient Details  Name: Christy [name] MRN: [id number] Date of Birth: Sep 24, 1955 Referring Provider (PT): Jolene Provost  PHYSICAL THERAPY DISCHARGE SUMMARY  Visits from Start of Care: 1  Current functional level related to goals / functional outcomes: Pt called to state she has had no issues after canalith repositioning on initial eval   Remaining deficits: Pt canceled her follow-up appointment due to having no major issues after canalith repositioning   Education / Equipment: Provided pt with HEP for self canalith repositioning and education on BPPV  Plan: Patient agrees to discharge.  Patient goals were partially met. Patient is being discharged due to being pleased with the current functional level.  ?????        Encounter Date: 01/19/2020   PT End of Session - 01/19/20 1416    Visit Number 1    Number of Visits 3    Date for PT Re-Evaluation 02/02/20    Authorization Type [employer]    PT Start Time 1315    PT Stop Time 1400    PT Time Calculation (min) 45 min    Activity Tolerance Patient tolerated treatment well    Behavior During Therapy WFL for tasks assessed/performed           Past Medical History:  Diagnosis Date  . Acute peptic ulcer, unspecified site, with hemorrhage, without mention of obstruction   . Anxiety   . Disorder of bone and cartilage, unspecified   . Fibromyalgia   . Frozen shoulder   . GERD (gastroesophageal reflux disease)   . Hyperlipidemia   . Hypertension    07/2018- pt states no HTN since losing over 70#  . Hyperthyroidism 08/2013  . Internal hemorrhoids   . Obstructive sleep apnea (adult) (pediatric)   . Osteopenia   . Other specific disorder of sleep of nonorganic origin   . PONV (postoperative nausea and vomiting)   . Restless  legs syndrome (RLS)   . Sleep apnea    07/2018 pt states surgery to correct but states she still has sleep apnea, just not as bad  . Tubular adenoma of colon   . Ulcerative proctitis ([place])   . Unspecified hypothyroidism   . Vaginitis 2018  . Vitamin D deficiency     Past Surgical History:  Procedure Laterality Date  . ABDOMINAL HYSTERECTOMY    . BREAST CYST ASPIRATION Right   . CESAREAN SECTION    . COLONOSCOPY N/A 05/25/2013   Procedure: COLONOSCOPY;  Surgeon: Lafayette Dragon, MD;  Location: [facility];  Service: Endoscopy;  Laterality: N/A;  . COLONOSCOPY    . KNEE ARTHROSCOPY Right    x 2  . LAPAROSCOPY     x 4  . NASAL SEPTUM SURGERY    . SIGMOIDOSCOPY    . TONSILLECTOMY AND ADENOIDECTOMY    . UPPER GASTROINTESTINAL ENDOSCOPY      There were no vitals filed for this visit.    Subjective Assessment - 01/19/20 1322    Subjective Pt reports a bad vertigo episode ~4 weeks ago. Pt states she was reaching down for a file and jerked real quick and immediately felt dizzy. It lasted a few hours with n/v. Pt reports current symptoms are not nearly as severe but still present. Pt states she can't make sudden movements to the right. Pt describes dizziness as a "Swimmy" sensation.  Pt has had hx of vertigo in beginning of 2020 and was unable to see a PCP due to [place] pandemic; however, it eventually went away after a few months.    Limitations Other (comment)   sudden movements, head turn to right   How long can you sit comfortably? n/a    How long can you stand comfortably? n/a    How long can you walk comfortably? n/a    Diagnostic tests Saw ENT    Patient Stated Goals Get rid of the dizziness              [facility] PT Assessment - 01/19/20 0001      Assessment   Medical Diagnosis Vertigo    Referring Provider (PT) Jolene Provost    Onset Date/Surgical Date 12/29/19    Hand Dominance Right    Prior Therapy None      Precautions   Precautions None      Restrictions    Weight Bearing Restrictions No      Balance Screen   Has the patient fallen in the past 6 months No      [place] residence      Prior Function   Level of Independence Independent    Vocation Full time employment    Vocation Requirements Works on computer      Observation/Other Assessments   Focus on [place] ([place])  81%    Other Surveys  Dizziness Handicap Inventory ([place])    Dizziness Handicap Inventory [facility])  42                  Vestibular Assessment - 01/19/20 0001      Symptom Behavior   Subjective history of current problem Pt has had history of ear aches (new since Aug 2021); pt reports history of vertigo in beginning of 2020    Type of Dizziness  "Funny feeling in head"   Swimmy   Frequency of Dizziness During sudden movements    Duration of Dizziness A few seconds    Symptom Nature Motion provoked    Aggravating Factors Turning body quickly;Turning head quickly;Moving eyes   Scrolling on computer monitor   Relieving Factors Head stationary    Progression of Symptoms Better    History of similar episodes In early 2020      Oculomotor Exam   Ocular ROM WFL    Spontaneous Absent    Gaze-induced  Absent    Head shaking Horizontal Absent   2/5 dizziness   Head Shaking Vertical Absent    Smooth Pursuits Intact    Saccades Slow      Vestibulo-Ocular Reflex   VOR 1 Head Only (x 1 viewing) 2/5 dizziness    VOR to Slow Head Movement Normal    VOR Cancellation Normal    Comment HIT: WFL      Positional Testing   Dix-Hallpike Dix-Hallpike Right;Dix-Hallpike Left    Sidelying Test --    Horizontal Canal Testing Horizontal Canal Right;Horizontal Canal Left      Dix-Hallpike Right   Dix-Hallpike Right Duration 5    Dix-Hallpike Right Symptoms --   Unable to visualize; however, pt with difficulty gazing L     Dix-Hallpike Left   Dix-Hallpike Left Duration 0    Dix-Hallpike Left Symptoms No nystagmus       Horizontal Canal Right   Horizontal Canal Right Duration 0    Horizontal Canal Right Symptoms Normal  Horizontal Canal Left   Horizontal Canal Left Duration 0    Horizontal Canal Left Symptoms Normal      Positional Sensitivities   Right Hallpike Moderate dizziness   2/5 for L Dix Hallpike   Up from Right Hallpike Mild dizziness    Up from Left Hallpike Mild dizziness              Objective measurements completed on examination: See above findings.       Epley maneuver performed x2 to treat canalith; performed x1 to review with pt for HEP         PT Education - 01/19/20 1429    Education Details Discussed BPPV, home management, and vestibular rehab if indicated    Person(s) Educated Patient    Methods Explanation;Handout;Verbal cues    Comprehension Verbalized understanding;Returned demonstration            PT Short Term Goals - 01/19/20 1407      PT SHORT TERM GOAL #1   Title Pt will be independent with home management of BPPV    Time 1    Status New    Target Date 01/26/20      PT SHORT TERM GOAL #2   Title Pt will have improved DHI to </=30 to be rated as mild severity    Baseline 42    Time 2    Period Weeks    Status New    Target Date 02/02/20      PT SHORT TERM GOAL #3   Title Pt will have improved FOTO score to 89%    Baseline 81%    Time 2    Period Weeks    Status New    Target Date 02/02/20                     Plan - 01/19/20 1418    Clinical Impression Statement Ms. Banker is an active 65 y/o F presenting to OPPT with c/o ongoing but improving dizziness x 4 weeks. Pt notes incidence of sudden head jerking after reaching for a file under her printer resulting in very bad episode of vertigo with n/v. Pt reports history of prior vertigo in 2020. Upon PT assessment, pt appears to have some continued motion sensitivities with (+) R mild BPPV with Dix-Hallpike. Epley maneuver performed accordingly and pt with improved symptoms  on re-attempt of Dix-Hallpike. Pt educated on how to perform self Epley maneuver at home and disussed PT 1 week f/u to determine if she has any continued needs or canalith repositioning.    Personal Factors and Comorbidities Age;Comorbidity 1;Past/Current Experience;Time since onset of injury/illness/exacerbation    Comorbidities TMJ dysfunction, hypothyroid, colitis    Examination-Activity Limitations Caring for Others;Bend;Other   sudden movements   Examination-Participation Restrictions Occupation    Stability/Clinical Decision Making Stable/Uncomplicated    Clinical Decision Making Low    Rehab Potential Good    PT Frequency 1x / week    PT Duration 2 weeks    PT Treatment/Interventions Canalith Repostioning;ADLs/Self Care Home Management;Gait training;Stair training;Functional mobility training;Therapeutic activities;Therapeutic exercise;Balance training;Neuromuscular re-education;Manual techniques;Patient/family education;Vestibular    PT Next Visit Plan F/u for any residual BPPV symptoms or need for any other canalith repositioning.    PT Home Exercise Plan Epley maneuver    Consulted and Agree with Plan of Care Patient           Patient will benefit from skilled therapeutic intervention in order to improve the following deficits and impairments:  Dizziness, Decreased mobility, Difficulty walking, Decreased balance  Visit Diagnosis: Dizziness and giddiness  BPPV (benign paroxysmal positional vertigo), right  Unsteadiness on feet     Problem List Patient Active Problem List   Diagnosis Date Noted  . DIARRHEA 03/18/2009  . HYPERSOMNIA 01/28/2009  . ULCERATIVE PROCTITIS 05/05/2008  . FIBROMYALGIA 03/16/2008  . RECTAL BLEEDING 02/13/2008  . PERSONAL HX COLONIC POLYPS 02/13/2008  . INADEQUATE SLEEP HYGIENE 12/03/2007  . COLONIC POLYPS 11/05/2007  . HYPOTHYROIDISM 11/05/2007  . VITAMIN D DEFICIENCY 11/05/2007  . HYPERLIPIDEMIA 11/05/2007  . RESTLESS LEG SYNDROME  11/05/2007  . HYPERTENSION 11/05/2007  . GERD 11/05/2007  . CONSTIPATION 11/05/2007  . IBS 11/05/2007  . OSTEOPENIA 11/05/2007  . ACUT PEPTC ULCR UNS SITE W/HEM W/O MENTION OBST 08/01/2007    Tareka Jhaveri April Ma L Dvante Hands PT, DPT 01/19/2020, 2:34 PM  [facility] [address] [place], [place], [address] Phone: [phone]   Fax:  [phone]  Name: Christy [name] MRN: [id number] Date of Birth: [birth year]

## 2020-01-27 ENCOUNTER — Ambulatory Visit: Payer: BC Managed Care – PPO | Admitting: Physical Therapy

## 2020-05-04 DIAGNOSIS — D225 Melanocytic nevi of trunk: Secondary | ICD-10-CM | POA: Diagnosis not present

## 2020-05-04 DIAGNOSIS — L821 Other seborrheic keratosis: Secondary | ICD-10-CM | POA: Diagnosis not present

## 2020-05-04 DIAGNOSIS — L578 Other skin changes due to chronic exposure to nonionizing radiation: Secondary | ICD-10-CM | POA: Diagnosis not present

## 2020-05-04 DIAGNOSIS — L719 Rosacea, unspecified: Secondary | ICD-10-CM | POA: Diagnosis not present

## 2020-05-04 DIAGNOSIS — B351 Tinea unguium: Secondary | ICD-10-CM | POA: Diagnosis not present

## 2020-07-01 DIAGNOSIS — E559 Vitamin D deficiency, unspecified: Secondary | ICD-10-CM | POA: Diagnosis not present

## 2020-07-01 DIAGNOSIS — E039 Hypothyroidism, unspecified: Secondary | ICD-10-CM | POA: Diagnosis not present

## 2020-07-01 DIAGNOSIS — M797 Fibromyalgia: Secondary | ICD-10-CM | POA: Diagnosis not present

## 2020-07-13 DIAGNOSIS — F430 Acute stress reaction: Secondary | ICD-10-CM | POA: Diagnosis not present

## 2020-07-30 DIAGNOSIS — F410 Panic disorder [episodic paroxysmal anxiety] without agoraphobia: Secondary | ICD-10-CM | POA: Diagnosis not present

## 2020-08-12 DIAGNOSIS — F410 Panic disorder [episodic paroxysmal anxiety] without agoraphobia: Secondary | ICD-10-CM | POA: Diagnosis not present

## 2020-08-23 DIAGNOSIS — F411 Generalized anxiety disorder: Secondary | ICD-10-CM | POA: Diagnosis not present

## 2020-08-26 DIAGNOSIS — M8580 Other specified disorders of bone density and structure, unspecified site: Secondary | ICD-10-CM | POA: Diagnosis not present

## 2020-08-30 ENCOUNTER — Other Ambulatory Visit: Payer: Self-pay | Admitting: Obstetrics

## 2020-09-08 DIAGNOSIS — U071 COVID-19: Secondary | ICD-10-CM

## 2020-09-08 HISTORY — DX: COVID-19: U07.1

## 2020-09-14 ENCOUNTER — Other Ambulatory Visit: Payer: Self-pay | Admitting: Gastroenterology

## 2020-09-20 DIAGNOSIS — F411 Generalized anxiety disorder: Secondary | ICD-10-CM | POA: Diagnosis not present

## 2020-09-21 ENCOUNTER — Ambulatory Visit (HOSPITAL_COMMUNITY)
Admission: RE | Admit: 2020-09-21 | Discharge: 2020-09-21 | Disposition: A | Discharge status: Home / Self Care | Payer: BC Managed Care – PPO | Source: Ambulatory Visit | Attending: Obstetrics | Admitting: Obstetrics

## 2020-09-21 DIAGNOSIS — M859 Disorder of bone density and structure, unspecified: Secondary | ICD-10-CM | POA: Diagnosis not present

## 2020-09-21 MED ORDER — ZOLEDRONIC ACID 5 MG/100ML IV SOLN
INTRAVENOUS | Status: AC
  Administered 2020-09-21: 5 mg
  Filled 2020-09-21: qty 100

## 2020-09-21 MED ORDER — ZOLEDRONIC ACID 5 MG/100ML IV SOLN: 5.0000 mg | Freq: Once | INTRAVENOUS | Status: DC

## 2020-10-04 ENCOUNTER — Ambulatory Visit
Admission: RE | Admit: 2020-10-04 | Discharge: 2020-10-04 | Disposition: A | Discharge status: Home / Self Care | Payer: BC Managed Care – PPO | Source: Ambulatory Visit | Attending: Emergency Medicine | Admitting: Emergency Medicine

## 2020-10-04 ENCOUNTER — Other Ambulatory Visit: Payer: Self-pay

## 2020-10-04 VITALS — BP 108/70 | HR 63 | Temp 98.1°F | Ht 67.75 in | Wt 135.0 lb

## 2020-10-04 DIAGNOSIS — R059 Cough, unspecified: Secondary | ICD-10-CM | POA: Diagnosis not present

## 2020-10-04 DIAGNOSIS — H6981 Other specified disorders of Eustachian tube, right ear: Secondary | ICD-10-CM

## 2020-10-04 MED ORDER — MECLIZINE HCL 12.5 MG PO TABS: 12.5000 mg | ORAL_TABLET | Freq: Three times a day (TID) | ORAL | 0 refills | Status: DC | PRN

## 2020-10-04 MED ORDER — BENZONATATE 100 MG PO CAPS: 100.0000 mg | ORAL_CAPSULE | Freq: Three times a day (TID) | ORAL | 0 refills | Status: DC

## 2020-10-04 MED ORDER — FLUTICASONE PROPIONATE 50 MCG/ACT NA SUSP: 1.0000 | Freq: Every day | NASAL | 2 refills | Status: DC

## 2020-10-04 NOTE — ED Triage Notes (Signed)
Patient states that she tested positive for COVID 4 weeks ago, patient has had some cough off and on, right ear ache, afebrile.  Patient has taken Ibuprofen, taken some ear drops.  Patient is vaccinated for COVID.

## 2020-10-04 NOTE — ED Provider Notes (Signed)
[facility]    CSN: [id number] Arrival date & time: 10/04/20  1247      History   Chief Complaint Chief Complaint  Patient presents with   Appointment    Ear Ache    HPI Christy [name] is a 65 y.o. female.   Patient presents with persistent intermittent dry nonproductive cough present for 4 weeks.  Right earache present for approximately 1 week.  Endorses accompanying dizziness, felt as if the room is spinning she becomes unbalanced.  Denies falls, syncope.denies headaches, ear drainage, ear itching, decreased hearing, fevers, chills, body, shortness of breath, wheezing, abdominal pain, nausea, vomiting, diarrhea, sore throat.  Patient was COVID-positive 1 month ago has been using over-the-counter ibuprofen and over-the-counter eardrops with no relief.  Vaccinated.  No known sick contact.  Past Medical History:  Diagnosis Date   Acute peptic ulcer, unspecified site, with hemorrhage, without mention of obstruction    Anxiety    Disorder of bone and cartilage, unspecified    Fibromyalgia    Frozen shoulder    GERD (gastroesophageal reflux disease)    Hyperlipidemia    Hypertension    07/2018- pt states no HTN since losing over 70#   Hyperthyroidism 08/2013   Internal hemorrhoids    Obstructive sleep apnea (adult) (pediatric)    Osteopenia    Other specific disorder of sleep of nonorganic origin    PONV (postoperative nausea and vomiting)    Restless legs syndrome (RLS)    Sleep apnea    07/2018 pt states surgery to correct but states she still has sleep apnea, just not as bad   Tubular adenoma of colon    Ulcerative proctitis ([place])    Unspecified hypothyroidism    Vaginitis 2018   Vitamin D deficiency     Patient Active Problem List   Diagnosis Date Noted   DIARRHEA 03/18/2009   HYPERSOMNIA 01/28/2009   ULCERATIVE PROCTITIS 05/05/2008   FIBROMYALGIA 03/16/2008   RECTAL BLEEDING 02/13/2008   PERSONAL HX COLONIC POLYPS 02/13/2008   INADEQUATE SLEEP  HYGIENE 12/03/2007   COLONIC POLYPS 11/05/2007   HYPOTHYROIDISM 11/05/2007   VITAMIN D DEFICIENCY 11/05/2007   HYPERLIPIDEMIA 11/05/2007   RESTLESS LEG SYNDROME 11/05/2007   HYPERTENSION 11/05/2007   GERD 11/05/2007   CONSTIPATION 11/05/2007   IBS 11/05/2007   OSTEOPENIA 11/05/2007   ACUT PEPTC ULCR UNS SITE W/HEM W/O MENTION OBST 08/01/2007    Past Surgical History:  Procedure Laterality Date   ABDOMINAL HYSTERECTOMY     BREAST CYST ASPIRATION Right    CESAREAN SECTION     COLONOSCOPY N/A 05/25/2013   Procedure: COLONOSCOPY;  Surgeon: Lafayette Dragon, MD;  Location: [facility];  Service: Endoscopy;  Laterality: N/A;   COLONOSCOPY     KNEE ARTHROSCOPY Right    x 2   LAPAROSCOPY     x 4   NASAL SEPTUM SURGERY     SIGMOIDOSCOPY     TONSILLECTOMY AND ADENOIDECTOMY     UPPER GASTROINTESTINAL ENDOSCOPY      OB History   No obstetric history on file.      Home Medications    Prior to Admission medications   Medication Sig Start Date End Date Taking? Authorizing Provider  benzonatate (TESSALON) 100 MG capsule Take 1 capsule (100 mg total) by mouth every 8 (eight) hours. 10/04/20  Yes Velva Molinari R, NP  cholecalciferol (VITAMIN D) 400 units TABS tablet Take 400 Units by mouth.   Yes [provider]  fish oil-omega-3 fatty acids  1000 MG capsule Take 2 g by mouth daily.   Yes [provider]  fluticasone (FLONASE) 50 MCG/ACT nasal spray Place 1 spray into both nostrils daily. 10/04/20  Yes Quetzally Callas R, NP  loratadine (CLARITIN) 10 MG tablet Take 10 mg by mouth.   Yes [provider]  meclizine (ANTIVERT) 12.5 MG tablet Take 1 tablet (12.5 mg total) by mouth 3 (three) times daily as needed for dizziness. 10/04/20  Yes Eliah Marquard R, NP  neomycin-polymyxin-hydrocortisone (CORTISPORIN) 3.5-10000-1 OTIC suspension Place 3 drops into the right ear 3 (three) times daily. 11/07/19  Yes Hall-Potvin, [place], PA-C  omeprazole (PRILOSEC) 20 MG capsule  Take 1 capsule (20 mg total) by mouth daily. 10/19/16  Yes Esterwood, Amy S, PA-C  PREMARIN vaginal cream Place 0.5 g vaginally 2 (two) times a week. 06/05/18  Yes [provider]  Probiotic Product (ALIGN) 4 MG CAPS Take by mouth.   Yes [provider]  SYNTHROID 150 MCG tablet Take 150 mcg by mouth daily. 03/17/14  Yes [provider]  Wheat Dextrin (BENEFIBER PO) Take by mouth every morning.   Yes [provider]  mesalamine (APRISO) 0.375 g 24 hr capsule TAKE 2 CAPSULES BY MOUTH EVERY DAY Patient not taking: Reported on 01/19/2020 10/21/19   Mauri Pole, MD  mesalamine (LIALDA) 1.2 g EC tablet TAKE 2 TABLETS (2.4 G TOTAL) BY MOUTH 2 (TWO) TIMES DAILY. Patient not taking: Reported on 08/22/2018 08/04/18   Irene Shipper, MD    Family History Family History  Problem Relation Age of Onset   Colon cancer Other        grandmother   Breast cancer Other    Lung cancer Maternal Grandfather    Kidney disease Mother    Diabetes Mother    Diabetes Father    Melanoma Sister    Leukemia Paternal Grandfather    Breast cancer Other        aunt   Colon cancer Paternal Grandmother    Colon polyps Neg Hx    Esophageal cancer Neg Hx    Stomach cancer Neg Hx    Rectal cancer Neg Hx     Social History Social History   Tobacco Use   Smoking status: Former    Packs/day: 0.10    Years: 15.00    Pack years: 1.50    Types: Cigarettes    Quit date: 02/27/1980    Years since quitting: 40.6   Smokeless tobacco: Never  Vaping Use   Vaping Use: Never used  Substance Use Topics   Alcohol use: No   Drug use: No     Allergies   Adhesive [tape], Latex, Oxycodone, Tetracycline, Tranxene [clorazepate dipotassium], and Dust mite extract   Review of Systems Review of Systems Defer to HPI    Physical Exam Triage Vital Signs ED Triage Vitals  Enc Vitals Group     BP 10/04/20 1257 108/70     Pulse Rate 10/04/20 1257 63     Resp --      Temp 10/04/20 1257  98.1 F (36.7 C)     Temp Source 10/04/20 1257 Oral     SpO2 10/04/20 1257 97 %     Weight 10/04/20 1259 135 lb (61.2 kg)     Height 10/04/20 1259 5' 7.75" (1.721 m)     Head Circumference --      Peak Flow --      Pain Score 10/04/20 1259 5     Pain Loc --  Pain Edu? --      Excl. in [place]? --    No data found.  Updated Vital Signs BP 108/70 (BP Location: Right Arm)   Pulse 63   Temp 98.1 F (36.7 C) (Oral)   Ht 5' 7.75" (1.721 m)   Wt 135 lb (61.2 kg)   SpO2 97%   BMI 20.68 kg/m   Visual Acuity Right Eye Distance:   Left Eye Distance:   Bilateral Distance:    Right Eye Near:   Left Eye Near:    Bilateral Near:     Physical Exam Constitutional:      Appearance: She is normal weight.  HENT:     Head: Normocephalic.     Right Ear: Hearing, ear canal and external ear normal. A middle ear effusion is present.     Left Ear: Hearing, tympanic membrane, ear canal and external ear normal.     Nose: Nose normal.     Mouth/Throat:     Mouth: Mucous membranes are moist.     Pharynx: Oropharynx is clear.  Eyes:     Extraocular Movements: Extraocular movements intact.  Cardiovascular:     Rate and Rhythm: Normal rate and regular rhythm.     Pulses: Normal pulses.     Heart sounds: Normal heart sounds.  Pulmonary:     Effort: Pulmonary effort is normal.  Musculoskeletal:        General: Normal range of motion.     Cervical back: Normal range of motion and neck supple.  Skin:    General: Skin is warm and dry.  Neurological:     Mental Status: She is alert and oriented to person, place, and time. Mental status is at baseline.  Psychiatric:        Mood and Affect: Mood normal.        Behavior: Behavior normal.     UC Treatments / Results  Labs (all labs ordered are listed, but only abnormal results are displayed) Labs Reviewed - No data to display  EKG   Radiology No results found.  Procedures Procedures (including critical care time)  Medications  Ordered in UC Medications - No data to display  Initial Impression / Assessment and Plan / UC Course  I have reviewed the triage vital signs and the nursing notes.  Pertinent labs & imaging results that were available during my care of the patient were reviewed by me and considered in my medical decision making (see chart for details).  Acute dysfunction of the right eustachian tube Cough  Dizziness possibly related to eustachian tube dysfunction, will attempt to clear congestion and monitor for improvement  1.  Meclizine 12.5 mg 3 times daily as needed 2.  Flonase 50 mcg 1 spray each nare daily 3.  Counter Mucinex in addition 4.  Tessalon 100 mg 3 times daily.,  Continue use of Delsym as needed cough 5.  PCP referral placed to establish care, in hopes of follow-up in the next 3 weeks, if unable to get appointment in any appropriate timeframe advised follow-up in urgent care for persistent symptoms Final Clinical Impressions(s) / UC Diagnoses   Final diagnoses:  Acute dysfunction of right eustachian tube  Cough     Discharge Instructions      Use flonase 1 spray each nare every morning for the next 2 weeks  Continue use of Claritin daily  Can use Mucinex twice a day as needed in addition to the above medications to help with congestion  Can  use meclizine 3 times a day (every 8 hours) as needed for dizziness  Can follow-up with primary care about 3 weeks for reevaluation, if unable to get appointment in a reasonable timeframe may follow-up at urgent care for reevaluation if symptoms continue to persist, a primary care referral was placed for you to help you find a local physician if the primary care at [facility] is not excepting patients  Can use Tessalon pill every 8 hours as needed for cough, can continue use of Delsym in addition to [facility]   ED Prescriptions     Medication Sig Dispense Auth. Provider   fluticasone (FLONASE) 50 MCG/ACT nasal spray Place 1 spray into both  nostrils daily. 11.1 mL Arlee Santosuosso, Vincente Liberty R, NP   meclizine (ANTIVERT) 12.5 MG tablet Take 1 tablet (12.5 mg total) by mouth 3 (three) times daily as needed for dizziness. 30 tablet Briane Birden R, NP   benzonatate (TESSALON) 100 MG capsule Take 1 capsule (100 mg total) by mouth every 8 (eight) hours. 21 capsule Tarvaris Puglia, Leitha Schuller, NP      PDMP not reviewed this encounter.   Hans Eden, NP 10/04/20 1357

## 2020-10-04 NOTE — Discharge Instructions (Addendum)
Use flonase 1 spray each nare every morning for the next 2 weeks  Continue use of Claritin daily  Can use Mucinex twice a day as needed in addition to the above medications to help with congestion  Can use meclizine 3 times a day (every 8 hours) as needed for dizziness  Can follow-up with primary care about 3 weeks for reevaluation, if unable to get appointment in a reasonable timeframe may follow-up at urgent care for reevaluation if symptoms continue to persist, a primary care referral was placed for you to help you find a local physician if the primary care at [facility] is not excepting patients  Can use Tessalon pill every 8 hours as needed for cough, can continue use of Delsym in addition to [employer]

## 2020-10-05 DIAGNOSIS — H40023 Open angle with borderline findings, high risk, bilateral: Secondary | ICD-10-CM | POA: Diagnosis not present

## 2020-10-07 DIAGNOSIS — F411 Generalized anxiety disorder: Secondary | ICD-10-CM | POA: Diagnosis not present

## 2020-10-20 ENCOUNTER — Encounter: Payer: Self-pay | Admitting: Physician Assistant

## 2020-10-20 ENCOUNTER — Ambulatory Visit (INDEPENDENT_AMBULATORY_CARE_PROVIDER_SITE_OTHER): Payer: BC Managed Care – PPO | Admitting: Physician Assistant

## 2020-10-20 ENCOUNTER — Other Ambulatory Visit (INDEPENDENT_AMBULATORY_CARE_PROVIDER_SITE_OTHER): Payer: BC Managed Care – PPO

## 2020-10-20 VITALS — BP 110/52 | HR 76 | Ht 67.25 in | Wt 136.5 lb

## 2020-10-20 DIAGNOSIS — K5120 Ulcerative (chronic) proctitis without complications: Secondary | ICD-10-CM

## 2020-10-20 DIAGNOSIS — K5909 Other constipation: Secondary | ICD-10-CM

## 2020-10-20 DIAGNOSIS — R110 Nausea: Secondary | ICD-10-CM | POA: Diagnosis not present

## 2020-10-20 LAB — COMPREHENSIVE METABOLIC PANEL
ALT: 14 U/L (ref 0–35)
AST: 19 U/L (ref 0–37)
Albumin: 4.5 g/dL (ref 3.5–5.2)
Alkaline Phosphatase: 47 U/L (ref 39–117)
BUN: 10 mg/dL (ref 6–23)
CO2: 30 mEq/L (ref 19–32)
Calcium: 10 mg/dL (ref 8.4–10.5)
Chloride: 103 mEq/L (ref 96–112)
Creatinine, Ser: 0.91 mg/dL (ref 0.40–1.20)
GFR: 67.01 mL/min (ref >60.00)
Glucose, Bld: 92 mg/dL (ref 70–99)
Potassium: 4.4 mEq/L (ref 3.5–5.1)
Sodium: 139 mEq/L (ref 135–145)
Total Bilirubin: 0.4 mg/dL (ref 0.2–1.2)
Total Protein: 7.5 g/dL (ref 6.0–8.3)

## 2020-10-20 LAB — CBC WITH DIFFERENTIAL/PLATELET
Basophils Absolute: 0.1 10*3/uL (ref 0.0–0.1)
Basophils Relative: 1.7 % (ref 0.0–3.0)
Eosinophils Absolute: 0.4 10*3/uL (ref 0.0–0.7)
Eosinophils Relative: 6.5 % — ABNORMAL HIGH (ref 0.0–5.0)
HCT: 39 % (ref 36.0–46.0)
Hemoglobin: 13 g/dL (ref 12.0–15.0)
Lymphocytes Relative: 23 % (ref 12.0–46.0)
Lymphs Abs: 1.4 10*3/uL (ref 0.7–4.0)
MCHC: 33.3 g/dL (ref 30.0–36.0)
MCV: 91.6 fl (ref 78.0–100.0)
Monocytes Absolute: 0.5 10*3/uL (ref 0.1–1.0)
Monocytes Relative: 8.7 % (ref 3.0–12.0)
Neutro Abs: 3.8 10*3/uL (ref 1.4–7.7)
Neutrophils Relative %: 60.1 % (ref 43.0–77.0)
Platelets: 234 10*3/uL (ref 150.0–400.0)
RBC: 4.25 Mil/uL (ref 3.87–5.11)
RDW: 13.7 % (ref 11.5–15.5)
WBC: 6.3 10*3/uL (ref 4.0–10.5)

## 2020-10-20 LAB — HIGH SENSITIVITY CRP: CRP, High Sensitivity: 1.7 mg/L (ref 0.000–5.000)

## 2020-10-20 MED ORDER — ONDANSETRON HCL 4 MG PO TABS: 4.0000 mg | ORAL_TABLET | Freq: Two times a day (BID) | ORAL | 2 refills | Status: AC | PRN

## 2020-10-20 MED ORDER — MESALAMINE ER 0.375 G PO CP24: 750.0000 mg | ORAL_CAPSULE | Freq: Every day | ORAL | 4 refills | Status: DC

## 2020-10-20 NOTE — Progress Notes (Signed)
Subjective:    Patient ID: Christy [name], female    DOB: 1955/07/11, 65 y.o.   MRN: [id number]  HPI  Christy [name] is a pleasant 65 year old white female, established with Dr. Silverio Decamp  and also known to myself.  She has history of ulcerative proctoscopy sigmoiditis diagnosed 9 to 10 years ago.  She comes in today for routine follow-up. Also with history of adenomatous and hyperplastic colon polyps, IBS, history of previous peptic ulcer disease, hypothyroidism, and fibromyalgia. Patient last had colonoscopy in June 2020, she had 4 -1 to 2 mm polyps removed and had a 5 mm polyp in the transverse colon, nonbleeding internal hemorrhoids and no evidence of active proctitis or colitis.  Path returned showing all of the polyps to be hyperplastic.  She is indicated for 3-year interval follow-up. She has been on Apriso 0.375 mg 2 tablets daily over the past couple of years with good control.  She says she is not have any active colitis symptoms at present, specifically no problems with diarrhea or rectal bleeding.  She does have chronic problems with constipation and stays on Benefiber generally twice daily.  She does feel that this causes some increased gassiness and uses Mylanta as needed.  She will have intermittent right lower quadrant discomfort usually prior to bowel movements which is relieved by bowel movement. She mentions that she has had some nausea and queasiness off and on recently and some decrease in appetite which she attributes to stress.  She was diagnosed with COVID-19 in mid July 2022 and did have some GI symptoms with that with nausea diarrhea and fever.  A couple of weeks later she had a generalized belly ache which has for the most part resolved. She also says she has been stressed and is grieving because her father passed away recently and wonders if that has caused some of the nausea symptoms.  She is working with a [employer].  No regular heartburn or indigestion, not on PPI  therapy. She has not had any labs done, baseline this year.  Review of Systems. Pertinent positive and negative review of systems were noted in the above HPI section.  All other review of systems was otherwise negative.   Outpatient Encounter Medications as of 10/20/2020  Medication Sig   benzonatate (TESSALON) 100 MG capsule Take 1 capsule (100 mg total) by mouth every 8 (eight) hours.   cholecalciferol (VITAMIN D) 400 units TABS tablet Take 400 Units by mouth.   fish oil-omega-3 fatty acids 1000 MG capsule Take 2 g by mouth daily.   fluticasone (FLONASE) 50 MCG/ACT nasal spray Place 1 spray into both nostrils daily.   loratadine (CLARITIN) 10 MG tablet Take 10 mg by mouth.   meclizine (ANTIVERT) 12.5 MG tablet Take 1 tablet (12.5 mg total) by mouth 3 (three) times daily as needed for dizziness.   neomycin-polymyxin-hydrocortisone (CORTISPORIN) 3.5-10000-1 OTIC suspension Place 3 drops into the right ear 3 (three) times daily.   ondansetron (ZOFRAN) 4 MG tablet Take 1 tablet (4 mg total) by mouth 2 (two) times daily as needed for nausea or vomiting.   PREMARIN vaginal cream Place 0.5 g vaginally 2 (two) times a week.   Probiotic Product (ALIGN) 4 MG CAPS Take by mouth.   SYNTHROID 150 MCG tablet Take 150 mcg by mouth daily.   Wheat Dextrin (BENEFIBER PO) Take by mouth every morning.   [DISCONTINUED] mesalamine (APRISO) 0.375 g 24 hr capsule TAKE 2 CAPSULES BY MOUTH EVERY DAY   mesalamine (APRISO) 0.375  g 24 hr capsule Take 2 capsules (0.75 g total) by mouth daily. TAKE 2 CAPSULES BY MOUTH EVERY DAY   [DISCONTINUED] mesalamine (LIALDA) 1.2 g EC tablet TAKE 2 TABLETS (2.4 G TOTAL) BY MOUTH 2 (TWO) TIMES DAILY. (Patient not taking: Reported on 08/22/2018)   [DISCONTINUED] omeprazole (PRILOSEC) 20 MG capsule Take 1 capsule (20 mg total) by mouth daily.   Facility-Administered Encounter Medications as of 10/20/2020  Medication   0.9 %  sodium chloride infusion   Allergies  Allergen Reactions    Adhesive [Tape]    Latex Hives   Oxycodone Itching   Tetracycline     REACTION: diarrhea   Tranxene [Clorazepate Dipotassium]     itche   Dust Mite Extract     And mold   Patient Active Problem List   Diagnosis Date Noted   DIARRHEA 03/18/2009   HYPERSOMNIA 01/28/2009   ULCERATIVE PROCTITIS 05/05/2008   FIBROMYALGIA 03/16/2008   RECTAL BLEEDING 02/13/2008   PERSONAL HX COLONIC POLYPS 02/13/2008   INADEQUATE SLEEP HYGIENE 12/03/2007   COLONIC POLYPS 11/05/2007   HYPOTHYROIDISM 11/05/2007   VITAMIN D DEFICIENCY 11/05/2007   HYPERLIPIDEMIA 11/05/2007   RESTLESS LEG SYNDROME 11/05/2007   HYPERTENSION 11/05/2007   GERD 11/05/2007   CONSTIPATION 11/05/2007   IBS 11/05/2007   OSTEOPENIA 11/05/2007   ACUT PEPTC ULCR UNS SITE W/HEM W/O MENTION OBST 08/01/2007   Social History   Socioeconomic History   Marital status: Married    Spouse name: Not on file   Number of children: Not on file   Years of education: Not on file   Highest education level: Not on file  Occupational History   Occupation: Psychologist, sport and exercise  Tobacco Use   Smoking status: Former    Packs/day: 0.10    Years: 15.00    Pack years: 1.50    Types: Cigarettes    Quit date: 02/27/1980    Years since quitting: 40.6   Smokeless tobacco: Never  Vaping Use   Vaping Use: Never used  Substance and Sexual Activity   Alcohol use: No   Drug use: No   Sexual activity: Not on file  Other Topics Concern   Not on file  Social History Narrative   Not on file   Social Determinants of Health   Financial Resource Strain: Not on file  Food Insecurity: Not on file  Transportation Needs: Not on file  Physical Activity: Not on file  Stress: Not on file  Social Connections: Not on file  Intimate Partner Violence: Not on file    Ms. Donnellan's family history includes Breast cancer in some other family members; Colon cancer in her paternal grandmother and another family member; Diabetes in her father and mother; Kidney  disease in her mother; Leukemia in her paternal grandfather; Lung cancer in her maternal grandfather; Melanoma in her sister.      Objective:    Vitals:   10/20/20 1447  BP: (!) 110/52  Pulse: 76    Physical Exam Well-developed well-nourished female in no acute distress.  Height, Weight, 136 BMI 21.2  HEENT; nontraumatic normocephalic, EOMI, PE R LA, sclera anicteric. Oropharynx;not examined Neck; supple, no JVD Cardiovascular; regular rate and rhythm with S1-S2, no murmur rub or gallop Pulmonary; Clear bilaterally Abdomen; soft, mildly tender RLQ nondistended, no palpable mass or hepatosplenomegaly, bowel sounds are active, low transverse  incisional scar Rectal;not done Skin; benign exam, no jaundice rash or appreciable lesions Extremities; no clubbing cyanosis or edema skin warm and dry Neuro/Psych; alert and  oriented x4, grossly nonfocal mood and affect appropriate        Assessment & Plan:   #74 65 year old female with chronic ulcerative proctosigmoiditis, in remission on Apriso 0.375 p.o. twice daily. Last colonoscopy June 2020 no evidence of active colitis  #2 history of adenomatous and hyperplastic polyps-up-to-date with colonoscopy last done June 2020 and indicated for 3-year interval follow-up #3 internal hemorrhoids #4.  Chronic constipation-currently stable on Benefiber 1 tablespoon twice daily, high-fiber diet and liberal fluid intake. #5 intermittent right lower quadrant pain relieved by bowel movements.  I suspect this is spasm related, patient with history of intra-abdominal adhesions status post right oophorectomy and hysterectomy #6 intermittent mild nausea-recent-patient had COVID-19 in mid July 2022 with associated GI symptoms, recent death of her father with grief reaction.  Plan; CBC with differential, c-Met, CRP Continue Apriso 0.375 p.o. twice daily 90-day supply/4 refills Continue Benefiber 1-2 doses daily Liberal water intake. Zofran 4 mg every 6  hours as needed for nausea Patient will be due for follow-up colonoscopy June 2023.  We will plan to see her back in June 2023, or sooner on a as needed basis.  Michaelah Credeur Genia Harold PA-C 10/20/2020   Cc: Reynold Bowen, MD

## 2020-10-20 NOTE — Patient Instructions (Addendum)
It was my pleasure to provide care to you today. Based on our discussion, I am providing you with my recommendations below:  RECOMMENDATION(S):   Please continue Benefiber 1-2 doses daily  PRESCRIPTION MEDICATION(S):   We have sent the following medication(s) to your pharmacy:  Mesalamine Zofran  NOTE: If your medication(s) requires a PRIOR AUTHORIZATION, we will receive notification from your pharmacy. Once received, the process to submit for approval may take up to 7-10 business days. You will be contacted about any denials we have received from your insurance company as well as alternatives recommended by your provider.  LABS:   Please proceed to the basement level for lab work before leaving today. Press "B" on the elevator. The lab is located at the first door on the left as you exit the elevator.  HEALTHCARE LAWS AND MY CHART RESULTS:   Due to recent changes in healthcare laws, you may see results of your imaging and/or laboratory studies on MyChart before I have had a chance to review them.  I understand that in some cases there may be results that are confusing or concerning to you. Please understand that not all results are received at same time and often I may need to interpret multiple results in order to provide you with the best plan of care or course of treatment. Therefore, I ask that you please give me 48 hours to thoroughly review all your results before contacting my office for clarification.    FOLLOW UP:  I would like for you to follow up with me annually. Please call the office at [phone] to schedule your appointment. Colonoscopy is due in 2023. Please schedule an appointment to be seen in June 2023  BMI:  If you are age 43 or younger, your body mass index should be between 19-25. Your There is no height or weight on file to calculate BMI. If this is out of the aformentioned range listed, please consider follow up with your Primary Care Provider.   MY  CHART:  The [facility] providers would like to encourage you to use [facility] to communicate with providers for non-urgent requests or questions.  Due to long hold times on the telephone, sending your provider a message by [facility] may be a faster and more efficient way to get a response.  Please allow 48 business hours for a response.  Please remember that this is for non-urgent requests.   Thank you for trusting me with your gastrointestinal care!    Amy Esterwood, PA-C

## 2020-10-21 DIAGNOSIS — Z01419 Encounter for gynecological examination (general) (routine) without abnormal findings: Secondary | ICD-10-CM | POA: Diagnosis not present

## 2020-10-21 DIAGNOSIS — Z6820 Body mass index (BMI) 20.0-20.9, adult: Secondary | ICD-10-CM | POA: Diagnosis not present

## 2020-10-21 DIAGNOSIS — Z1231 Encounter for screening mammogram for malignant neoplasm of breast: Secondary | ICD-10-CM | POA: Diagnosis not present

## 2020-10-21 DIAGNOSIS — M8580 Other specified disorders of bone density and structure, unspecified site: Secondary | ICD-10-CM | POA: Diagnosis not present

## 2020-10-21 DIAGNOSIS — N898 Other specified noninflammatory disorders of vagina: Secondary | ICD-10-CM | POA: Diagnosis not present

## 2020-10-22 DIAGNOSIS — F411 Generalized anxiety disorder: Secondary | ICD-10-CM | POA: Diagnosis not present

## 2020-10-24 NOTE — Progress Notes (Signed)
Reviewed and agree with documentation and assessment and plan. K. Veena Lilymae Swiech , MD   

## 2020-10-25 ENCOUNTER — Ambulatory Visit
Admission: RE | Admit: 2020-10-25 | Discharge: 2020-10-25 | Disposition: A | Discharge status: Home / Self Care | Payer: BC Managed Care – PPO | Source: Ambulatory Visit | Attending: Emergency Medicine | Admitting: Emergency Medicine

## 2020-10-25 ENCOUNTER — Other Ambulatory Visit: Payer: Self-pay

## 2020-10-25 VITALS — BP 111/55 | HR 61 | Temp 98.0°F | Resp 18

## 2020-10-25 DIAGNOSIS — H9201 Otalgia, right ear: Secondary | ICD-10-CM

## 2020-10-25 NOTE — ED Provider Notes (Signed)
HPI  SUBJECTIVE:  Christy [name] is a 65 y.o. female who presents with 6 weeks of right ear pain, decreased hearing since having COVID in July.  She reports postnasal drip and she reports a constant, high-pitched seconds long episode of tinnitus last night.  None since.  No fevers, nasal congestion, rhinorrhea, popping in her ear.  She had vertigo when she was seen here last, but this has resolved.  She did not require any meclizine.  She states that she grinds her teeth, but has a bite guard at night.  No antibiotics in the past month no antipyretic in the past 6 hours.  No allergy symptoms. Patient was seen here on 9/8 for 4 weeks for cough, vertigo, ear pain.  She was sent home with meclizine, Tessalon, Claritin and Mucinex.  She states the Mucinex and Flonase have helped.  She discontinued the Mucinex several days ago due to GI upset.  Symptoms are worse with clenching her teeth.  She has a past medical history of ulcerative colitis, hypothyroidism, COVID.  PMD: Reynold Bowen, MD ENT: Dr. Redmond Baseman at [facility].   Past Medical History:  Diagnosis Date   Acute peptic ulcer, unspecified site, with hemorrhage, without mention of obstruction    Anxiety    COVID-19    Disorder of bone and cartilage, unspecified    Fibromyalgia    Frozen shoulder    GERD (gastroesophageal reflux disease)    Hyperlipidemia    Hypertension    07/2018- pt states no HTN since losing over 70#   Hyperthyroidism 08/2013   Internal hemorrhoids    Obstructive sleep apnea (adult) (pediatric)    Osteopenia    Other specific disorder of sleep of nonorganic origin    PONV (postoperative nausea and vomiting)    Restless legs syndrome (RLS)    Sleep apnea    07/2018 pt states surgery to correct but states she still has sleep apnea, just not as bad   Tubular adenoma of colon    Ulcerative proctitis ([place])    Unspecified hypothyroidism    Vaginitis 2018   Vitamin D deficiency     Past Surgical History:  Procedure  Laterality Date   ABDOMINAL HYSTERECTOMY     BREAST CYST ASPIRATION Right    CESAREAN SECTION     COLONOSCOPY N/A 05/25/2013   Procedure: COLONOSCOPY;  Surgeon: Lafayette Dragon, MD;  Location: [facility];  Service: Endoscopy;  Laterality: N/A;   COLONOSCOPY     KNEE ARTHROSCOPY Right    x 2   LAPAROSCOPY     x 4   NASAL SEPTUM SURGERY     SIGMOIDOSCOPY     TONSILLECTOMY AND ADENOIDECTOMY     UPPER GASTROINTESTINAL ENDOSCOPY      Family History  Problem Relation Age of Onset   Colon cancer Other        grandmother   Breast cancer Other    Lung cancer Maternal Grandfather    Kidney disease Mother    Diabetes Mother    Diabetes Father    Melanoma Sister    Leukemia Paternal Grandfather    Breast cancer Other        aunt   Colon cancer Paternal Grandmother    Colon polyps Neg Hx    Esophageal cancer Neg Hx    Stomach cancer Neg Hx    Rectal cancer Neg Hx     Social History   Tobacco Use   Smoking status: Former    Packs/day: 0.10  Years: 15.00    Pack years: 1.50    Types: Cigarettes    Quit date: 02/27/1980    Years since quitting: 40.6   Smokeless tobacco: Never  Vaping Use   Vaping Use: Never used  Substance Use Topics   Alcohol use: No   Drug use: No     Current Facility-Administered Medications:    0.9 %  sodium chloride infusion, 500 mL, Intravenous, Once, Nandigam, Kavitha V, MD  Current Outpatient Medications:    ALPRAZolam (XANAX) 1 MG tablet, Take by mouth., Disp: , Rfl:    cholecalciferol (VITAMIN D) 400 units TABS tablet, Take 400 Units by mouth., Disp: , Rfl:    fish oil-omega-3 fatty acids 1000 MG capsule, Take 2 g by mouth daily., Disp: , Rfl:    fluticasone (FLONASE) 50 MCG/ACT nasal spray, Place 1 spray into both nostrils daily., Disp: 11.1 mL, Rfl: 2   loratadine (CLARITIN) 10 MG tablet, Take 10 mg by mouth., Disp: , Rfl:    mesalamine (APRISO) 0.375 g 24 hr capsule, Take 2 capsules (0.75 g total) by mouth daily. TAKE 2 CAPSULES BY MOUTH  EVERY DAY, Disp: 180 capsule, Rfl: 4   ondansetron (ZOFRAN) 4 MG tablet, Take 1 tablet (4 mg total) by mouth 2 (two) times daily as needed for nausea or vomiting., Disp: 60 tablet, Rfl: 2   PREMARIN vaginal cream, Place 0.5 g vaginally 2 (two) times a week., Disp: , Rfl:    Probiotic Product (ALIGN) 4 MG CAPS, Take by mouth., Disp: , Rfl:    SYNTHROID 150 MCG tablet, Take 150 mcg by mouth daily., Disp: , Rfl: 9   Wheat Dextrin (BENEFIBER PO), Take by mouth every morning., Disp: , Rfl:   Allergies  Allergen Reactions   Adhesive [Tape]    Latex Hives   Oxycodone Itching   Tetracycline     REACTION: diarrhea   Tranxene [Clorazepate Dipotassium]     itche   Dust Mite Extract     And mold     ROS  As noted in HPI.   Physical Exam  BP (!) 111/55 (BP Location: Left Arm)   Pulse 61   Temp 98 F (36.7 C) (Oral)   Resp 18   SpO2 97%   Constitutional: Well developed, well nourished, no acute distress Eyes:  EOMI, conjunctiva normal bilaterally HENT: Normocephalic, atraumatic,mucus membranes moist.  External ears, EAC normal bilaterally.  No pain with traction on pinna, palpation of tragus bilaterally.  TMs normal bilaterally.  Decreased hearing on the right compared to the left.  No obvious nasal congestion.  No TMJ tenderness.  No crepitus. Neck: No cervical lymphadenopathy Respiratory: Normal inspiratory effort Cardiovascular: Normal rate GI: nondistended skin: No rash, skin intact Musculoskeletal: no deformities Neurologic: Alert & oriented x 3, no focal neuro deficits Psychiatric: Speech and behavior appropriate   ED Course   Medications - No data to display  No orders of the defined types were placed in this encounter.   No results found for this or any previous visit (from the past 24 hour(s)). No results found.  ED Clinical Impression  1. Right ear pain      ED Assessment/Plan  Previous records reviewed.  As noted in HPI  Patient with otalgia.  It does  not appear to be otitis media or TMJ arthralgia.  We will have her follow-up with Dr. Redmond Baseman at [facility] as this has been ongoing for 6 weeks..  Continue Flonase.  Discussed MDM, treatment plan, and plan  for follow-up with patient. patient agrees with plan.   No orders of the defined types were placed in this encounter.     *This clinic note was created using Dragon dictation software. Therefore, there may be occasional mistakes despite careful proofreading.  ?    Melynda Ripple, MD 10/25/20 2108

## 2020-10-25 NOTE — Discharge Instructions (Addendum)
Continue Flonase.  Please follow-up with Dr. Redmond Baseman.  This does not appear to be your temporomandibular joint or an inner ear infection.

## 2020-10-25 NOTE — ED Triage Notes (Signed)
Last seen at [facility] on 10/04/20 on for similar. Since the last OV, her cough has improved but continues. Pt c/o right ear pain and congestion. Flonase and decongestants have helped some.

## 2020-11-02 DIAGNOSIS — F411 Generalized anxiety disorder: Secondary | ICD-10-CM | POA: Diagnosis not present

## 2020-11-03 DIAGNOSIS — H9201 Otalgia, right ear: Secondary | ICD-10-CM | POA: Diagnosis not present

## 2020-11-03 DIAGNOSIS — J3489 Other specified disorders of nose and nasal sinuses: Secondary | ICD-10-CM | POA: Diagnosis not present

## 2020-11-03 DIAGNOSIS — K219 Gastro-esophageal reflux disease without esophagitis: Secondary | ICD-10-CM | POA: Diagnosis not present

## 2020-11-03 DIAGNOSIS — M2669 Other specified disorders of temporomandibular joint: Secondary | ICD-10-CM | POA: Diagnosis not present

## 2020-11-04 ENCOUNTER — Other Ambulatory Visit: Payer: Self-pay | Admitting: Physician Assistant

## 2020-11-04 DIAGNOSIS — G8929 Other chronic pain: Secondary | ICD-10-CM

## 2020-11-04 DIAGNOSIS — H9201 Otalgia, right ear: Secondary | ICD-10-CM

## 2020-11-11 ENCOUNTER — Ambulatory Visit
Admission: RE | Admit: 2020-11-11 | Discharge: 2020-11-11 | Disposition: A | Discharge status: Home / Self Care | Payer: BC Managed Care – PPO | Source: Ambulatory Visit | Attending: Physician Assistant | Admitting: Physician Assistant

## 2020-11-11 DIAGNOSIS — R0989 Other specified symptoms and signs involving the circulatory and respiratory systems: Secondary | ICD-10-CM | POA: Diagnosis not present

## 2020-11-11 DIAGNOSIS — J3489 Other specified disorders of nose and nasal sinuses: Secondary | ICD-10-CM | POA: Diagnosis not present

## 2020-11-11 DIAGNOSIS — G8929 Other chronic pain: Secondary | ICD-10-CM

## 2020-11-11 DIAGNOSIS — H9201 Otalgia, right ear: Secondary | ICD-10-CM | POA: Diagnosis not present

## 2020-11-11 MED ORDER — IOPAMIDOL (ISOVUE-300) INJECTION 61%
75.0000 mL | Freq: Once | INTRAVENOUS | Status: AC | PRN
  Administered 2020-11-11: 75 mL via INTRAVENOUS

## 2020-11-17 DIAGNOSIS — E785 Hyperlipidemia, unspecified: Secondary | ICD-10-CM | POA: Diagnosis not present

## 2020-11-17 DIAGNOSIS — M797 Fibromyalgia: Secondary | ICD-10-CM | POA: Diagnosis not present

## 2020-11-23 ENCOUNTER — Other Ambulatory Visit: Payer: BC Managed Care – PPO

## 2020-12-06 DIAGNOSIS — F411 Generalized anxiety disorder: Secondary | ICD-10-CM | POA: Diagnosis not present

## 2020-12-09 DIAGNOSIS — H93293 Other abnormal auditory perceptions, bilateral: Secondary | ICD-10-CM | POA: Diagnosis not present

## 2020-12-27 DIAGNOSIS — F411 Generalized anxiety disorder: Secondary | ICD-10-CM | POA: Diagnosis not present

## 2021-01-17 DIAGNOSIS — L309 Dermatitis, unspecified: Secondary | ICD-10-CM | POA: Diagnosis not present

## 2021-01-26 DIAGNOSIS — F411 Generalized anxiety disorder: Secondary | ICD-10-CM | POA: Diagnosis not present

## 2021-02-08 DIAGNOSIS — F411 Generalized anxiety disorder: Secondary | ICD-10-CM | POA: Diagnosis not present

## 2021-03-01 DIAGNOSIS — F411 Generalized anxiety disorder: Secondary | ICD-10-CM | POA: Diagnosis not present

## 2021-03-14 DIAGNOSIS — F411 Generalized anxiety disorder: Secondary | ICD-10-CM | POA: Diagnosis not present

## 2021-03-28 DIAGNOSIS — F411 Generalized anxiety disorder: Secondary | ICD-10-CM | POA: Diagnosis not present

## 2021-04-12 ENCOUNTER — Other Ambulatory Visit: Payer: Self-pay

## 2021-04-12 ENCOUNTER — Ambulatory Visit (INDEPENDENT_AMBULATORY_CARE_PROVIDER_SITE_OTHER): Payer: BC Managed Care – PPO | Admitting: Internal Medicine

## 2021-04-12 ENCOUNTER — Encounter: Payer: Self-pay | Admitting: Internal Medicine

## 2021-04-12 VITALS — BP 118/70 | HR 66 | Resp 18 | Ht 67.25 in | Wt 135.8 lb

## 2021-04-12 DIAGNOSIS — F5101 Primary insomnia: Secondary | ICD-10-CM

## 2021-04-12 DIAGNOSIS — E039 Hypothyroidism, unspecified: Secondary | ICD-10-CM | POA: Diagnosis not present

## 2021-04-12 DIAGNOSIS — E782 Mixed hyperlipidemia: Secondary | ICD-10-CM | POA: Diagnosis not present

## 2021-04-12 DIAGNOSIS — K5120 Ulcerative (chronic) proctitis without complications: Secondary | ICD-10-CM

## 2021-04-12 DIAGNOSIS — Z0000 Encounter for general adult medical examination without abnormal findings: Secondary | ICD-10-CM | POA: Diagnosis not present

## 2021-04-12 DIAGNOSIS — M810 Age-related osteoporosis without current pathological fracture: Secondary | ICD-10-CM

## 2021-04-12 LAB — CBC
HCT: 40.3 % (ref 36.0–46.0)
Hemoglobin: 13.4 g/dL (ref 12.0–15.0)
MCHC: 33.2 g/dL (ref 30.0–36.0)
MCV: 91.9 fl (ref 78.0–100.0)
Platelets: 208 10*3/uL (ref 150.0–400.0)
RBC: 4.38 Mil/uL (ref 3.87–5.11)
RDW: 13.4 % (ref 11.5–15.5)
WBC: 4.9 10*3/uL (ref 4.0–10.5)

## 2021-04-12 NOTE — Assessment & Plan Note (Signed)
Uses xanax 1.5 mg nightly for sleep. Rarely can use 1 mg for sleep. Just filled and can be renewed when due. [place] narcotic database reviewed and no inappropriate fills.

## 2021-04-12 NOTE — Assessment & Plan Note (Signed)
Seeing GI and controlled on mesalamine 0.75 g daily. Checking CMP and CBC today. Due for colonoscopy this summer.

## 2021-04-12 NOTE — Assessment & Plan Note (Signed)
Checking lipid panel and adjust as needed. She has not had rechecked recently.

## 2021-04-12 NOTE — Patient Instructions (Addendum)
Think about the shingles vaccine.  Think about the covid-19 booster.  Let us know when you need the xanax refilled.

## 2021-04-12 NOTE — Assessment & Plan Note (Signed)
Flu shot up to date. Covid-19 booster counseled. Shingrix counseled she will think about it. Tetanus counseled. Colonoscopy up to date. Mammogram up to date with gyn, pap smear not indicated and dexa up to date with gyn. Counseled about sun safety and mole surveillance. Counseled about the dangers of distracted driving. Given 10 year screening recommendations.

## 2021-04-12 NOTE — Assessment & Plan Note (Signed)
Seeing endo Dr Forde Dandy for monitoring and management. Taking synthroid 150 mcg daily 5 days, 75 mcg daily 1 day, none 1 day per week.

## 2021-04-12 NOTE — Progress Notes (Signed)
° °  Subjective:   Patient ID: Christy [name], female    DOB: [birth year], 66 y.o.   MRN: [id number]  HPI The patient is a new 66 YO female coming in for ongoing care.  Also wants physical.  PMH, [place], social history reviewed and updated  Review of Systems  Constitutional: Negative.   HENT: Negative.    Eyes: Negative.   Respiratory:  Negative for cough, chest tightness and shortness of breath.   Cardiovascular:  Negative for chest pain, palpitations and leg swelling.  Gastrointestinal:  Positive for constipation. Negative for abdominal distention, abdominal pain, diarrhea, nausea and vomiting.  Musculoskeletal: Negative.   Skin: Negative.   Neurological: Negative.   Psychiatric/Behavioral:  Positive for sleep disturbance.    Objective:  Physical Exam Constitutional:      Appearance: She is well-developed.  HENT:     Head: Normocephalic and atraumatic.  Cardiovascular:     Rate and Rhythm: Normal rate and regular rhythm.  Pulmonary:     Effort: Pulmonary effort is normal. No respiratory distress.     Breath sounds: Normal breath sounds. No wheezing or rales.  Abdominal:     General: Bowel sounds are normal. There is no distension.     Palpations: Abdomen is soft.     Tenderness: There is no abdominal tenderness. There is no rebound.  Musculoskeletal:     Cervical back: Normal range of motion.  Skin:    General: Skin is warm and dry.  Neurological:     Mental Status: She is alert and oriented to person, place, and time.     Coordination: Coordination normal.    Vitals:   04/12/21 0823  BP: 118/70  Pulse: 66  Resp: 18  SpO2: 98%  Weight: 135 lb 12.8 oz (61.6 kg)  Height: 5' 7.25" (1.708 m)   EKG: Rate 57, axis normal, interval normal, sinus brady, no st or t wave changes, no prior to compare  This visit occurred during the SARS-CoV-2 public health emergency.  Safety protocols were in place, including screening questions prior to the visit, additional usage of staff  PPE, and extensive cleaning of exam room while observing appropriate contact time as indicated for disinfecting solutions.   Assessment & Plan:

## 2021-04-12 NOTE — Assessment & Plan Note (Signed)
Suspect this diagnosis as she is getting reclast from ob/gyn last July 2022.

## 2021-04-13 LAB — LIPID PANEL
Cholesterol: 192 mg/dL (ref 0–200)
HDL: 82.6 mg/dL (ref >39.00)
LDL Cholesterol: 100 mg/dL — ABNORMAL HIGH (ref 0–99)
NonHDL: 109.71
Total CHOL/HDL Ratio: 2
Triglycerides: 49 mg/dL (ref 0.0–149.0)
VLDL: 9.8 mg/dL (ref 0.0–40.0)

## 2021-04-13 LAB — COMPREHENSIVE METABOLIC PANEL
ALT: 15 U/L (ref 0–35)
AST: 20 U/L (ref 0–37)
Albumin: 4.4 g/dL (ref 3.5–5.2)
Alkaline Phosphatase: 35 U/L — ABNORMAL LOW (ref 39–117)
BUN: 13 mg/dL (ref 6–23)
CO2: 35 mEq/L — ABNORMAL HIGH (ref 19–32)
Calcium: 10 mg/dL (ref 8.4–10.5)
Chloride: 100 mEq/L (ref 96–112)
Creatinine, Ser: 0.97 mg/dL (ref 0.40–1.20)
GFR: 61.86 mL/min (ref >60.00)
Glucose, Bld: 79 mg/dL (ref 70–99)
Potassium: 4.5 mEq/L (ref 3.5–5.1)
Sodium: 137 mEq/L (ref 135–145)
Total Bilirubin: 0.4 mg/dL (ref 0.2–1.2)
Total Protein: 7.1 g/dL (ref 6.0–8.3)

## 2021-04-25 DIAGNOSIS — F411 Generalized anxiety disorder: Secondary | ICD-10-CM | POA: Diagnosis not present

## 2021-05-09 DIAGNOSIS — F411 Generalized anxiety disorder: Secondary | ICD-10-CM | POA: Diagnosis not present

## 2021-05-20 DIAGNOSIS — J324 Chronic pansinusitis: Secondary | ICD-10-CM | POA: Diagnosis not present

## 2021-06-30 DIAGNOSIS — E559 Vitamin D deficiency, unspecified: Secondary | ICD-10-CM | POA: Diagnosis not present

## 2021-06-30 DIAGNOSIS — E039 Hypothyroidism, unspecified: Secondary | ICD-10-CM | POA: Diagnosis not present

## 2021-08-11 ENCOUNTER — Encounter: Payer: Self-pay | Admitting: Gastroenterology

## 2021-09-01 ENCOUNTER — Ambulatory Visit (AMBULATORY_SURGERY_CENTER): Payer: Self-pay | Admitting: *Deleted

## 2021-09-01 VITALS — Ht 67.25 in | Wt 132.0 lb

## 2021-09-01 DIAGNOSIS — Z86010 Personal history of colonic polyps: Secondary | ICD-10-CM

## 2021-09-01 DIAGNOSIS — K5120 Ulcerative (chronic) proctitis without complications: Secondary | ICD-10-CM

## 2021-09-01 MED ORDER — NA SULFATE-K SULFATE-MG SULF 17.5-3.13-1.6 GM/177ML PO SOLN: 1.0000 | Freq: Once | ORAL | 0 refills | Status: AC

## 2021-09-01 NOTE — Progress Notes (Signed)
No egg or soy allergy known to patient  No issues known to pt with past sedation with any surgeries or procedures Patient denies ever being told they had issues or difficulty with intubation  No FH of Malignant Hyperthermia Pt is not on diet pills Pt is not on  home 02  Pt is not on blood thinners  Pt denies issues with constipation  No A fib or A flutter Have any cardiac testing pending--pt denies  Pt instructed to use Singlecare.com or GoodRx for a price reduction on prep

## 2021-09-17 ENCOUNTER — Encounter: Payer: Self-pay | Admitting: Certified Registered Nurse Anesthetist

## 2021-09-18 ENCOUNTER — Encounter: Payer: Self-pay | Admitting: Gastroenterology

## 2021-09-21 DIAGNOSIS — M8580 Other specified disorders of bone density and structure, unspecified site: Secondary | ICD-10-CM | POA: Diagnosis not present

## 2021-09-22 ENCOUNTER — Encounter: Payer: Self-pay | Admitting: Gastroenterology

## 2021-09-22 ENCOUNTER — Ambulatory Visit (AMBULATORY_SURGERY_CENTER): Payer: BC Managed Care – PPO | Admitting: Gastroenterology

## 2021-09-22 VITALS — BP 92/43 | HR 59 | Temp 98.2°F | Resp 17 | Ht 67.25 in | Wt 132.0 lb

## 2021-09-22 DIAGNOSIS — Z860100 Personal history of colon polyps, unspecified: Secondary | ICD-10-CM

## 2021-09-22 DIAGNOSIS — Z09 Encounter for follow-up examination after completed treatment for conditions other than malignant neoplasm: Secondary | ICD-10-CM | POA: Diagnosis not present

## 2021-09-22 DIAGNOSIS — K5120 Ulcerative (chronic) proctitis without complications: Secondary | ICD-10-CM

## 2021-09-22 MED ORDER — SODIUM CHLORIDE 0.9 % IV SOLN: 500.0000 mL | Freq: Once | INTRAVENOUS | Status: DC

## 2021-09-22 NOTE — Progress Notes (Signed)
Pt's states no medical or surgical changes since previsit or office visit. 

## 2021-09-22 NOTE — Patient Instructions (Signed)
YOU HAD AN ENDOSCOPIC PROCEDURE TODAY AT [facility]:   Refer to the procedure report that was given to you for any specific questions about what was found during the examination.  If the procedure report does not answer your questions, please call your gastroenterologist to clarify.  If you requested that your care partner not be given the details of your procedure findings, then the procedure report has been included in a sealed envelope for you to review at your convenience later.  YOU SHOULD EXPECT: Some feelings of bloating in the abdomen. Passage of more gas than usual.  Walking can help get rid of the air that was put into your GI tract during the procedure and reduce the bloating. If you had a lower endoscopy (such as a colonoscopy or flexible sigmoidoscopy) you may notice spotting of blood in your stool or on the toilet paper. If you underwent a bowel prep for your procedure, you may not have a normal bowel movement for a few days.  Please Note:  You might notice some irritation and congestion in your nose or some drainage.  This is from the oxygen used during your procedure.  There is no need for concern and it should clear up in a day or so.  SYMPTOMS TO REPORT IMMEDIATELY:  Following lower endoscopy (colonoscopy or flexible sigmoidoscopy):  Excessive amounts of blood in the stool  Significant tenderness or worsening of abdominal pains  Swelling of the abdomen that is new, acute  Fever of 100F or higher  For urgent or emergent issues, a gastroenterologist can be reached at any hour by calling [phone]. Do not use MyChart messaging for urgent concerns.    DIET:  We do recommend a small meal at first, but then you may proceed to your regular diet.  Drink plenty of fluids but you should avoid alcoholic beverages for 24 hours.  ACTIVITY:  You should plan to take it easy for the rest of today and you should NOT DRIVE or use heavy machinery until tomorrow (because of  the sedation medicines used during the test).    FOLLOW UP: Our staff will call the number listed on your records the next business day following your procedure.  We will call around 7:15- 8:00 am to check on you and address any questions or concerns that you may have regarding the information given to you following your procedure. If we do not reach you, we will leave a message.  If you develop any symptoms (ie: fever, flu-like symptoms, shortness of breath, cough etc.) before then, please call [phone].  If you test positive for Covid 19 in the 2 weeks post procedure, please call and report this information to [place].    SIGNATURES/CONFIDENTIALITY: You and/or your care partner have signed paperwork which will be entered into your electronic medical record.  These signatures attest to the fact that that the information above on your After Visit Summary has been reviewed and is understood.  Full responsibility of the confidentiality of this discharge information lies with you and/or your care-partner.

## 2021-09-22 NOTE — Progress Notes (Signed)
Report given to PACU, vss 

## 2021-09-22 NOTE — Op Note (Signed)
[name] Patient Name: Christy [name] Procedure Date: 09/22/2021 2:48 PM MRN: [id number] Endoscopist: Mauri Pole , MD Age: 66 Referring MD:  Date of Birth: 02-20-1956 Gender: Female Account #: [id number] Procedure:                Colonoscopy Indications:              High risk colon cancer surveillance: Personal                            history of colonic polyps, High risk colon cancer                            surveillance: Personal history of multiple (3 or                            more) adenomas, High risk colon cancer                            surveillance: Personal history of adenoma less than                            10 mm in size Medicines:                Monitored Anesthesia Care Procedure:                Pre-Anesthesia Assessment:                           - Prior to the procedure, a History and Physical                            was performed, and patient medications and                            allergies were reviewed. The patient's tolerance of                            previous anesthesia was also reviewed. The risks                            and benefits of the procedure and the sedation                            options and risks were discussed with the patient.                            All questions were answered, and informed consent                            was obtained. Prior Anticoagulants: The patient has                            taken no previous anticoagulant or antiplatelet  agents. ASA Grade Assessment: II - A patient with                            mild systemic disease. After reviewing the risks                            and benefits, the patient was deemed in                            satisfactory condition to undergo the procedure.                           After obtaining informed consent, the colonoscope                            was passed under direct vision. Throughout the                             procedure, the patient's blood pressure, pulse, and                            oxygen saturations were monitored continuously. The                            Olympus PCF-H190DL ([id number]) Colonoscope was                            introduced through the anus and advanced to the the                            cecum, identified by appendiceal orifice and                            ileocecal valve. The Olympus [id number]-[id number] (#[id number])                            Colonoscope was introduced through the anus and                            advanced to the the cecum, identified by                            appendiceal orifice and ileocecal valve. The                            colonoscopy was performed without difficulty. The                            patient tolerated the procedure well. The quality                            of the bowel preparation was good. The ileocecal  valve, appendiceal orifice, and rectum were                            photographed. Scope In: 3:13:30 PM Scope Out: 3:35:26 PM Scope Withdrawal Time: 0 hours 6 minutes 41 seconds  Total Procedure Duration: 0 hours 21 minutes 56 seconds  Findings:                 The perianal and digital rectal examinations were                            normal.                           Inflammation was not found based on the endoscopic                            appearance of the mucosa. This was graded as Mayo                            Score 0 (normal or inactive disease), and when                            compared to previous examinations, the findings are                            quiescent.                           The exam was otherwise without abnormality. Complications:            No immediate complications. Estimated Blood Loss:     Estimated blood loss: none. Impression:               - Inflammation was not found on today's exam (based                            on the endoscopic  appearance of the mucosa). This                            was graded as Mayo Score 0 (normal or inactive                            disease), quiescent compared to previous                            examinations.                           - The examination was otherwise normal.                           - No specimens collected. Recommendation:           - Resume previous diet.                           - Continue  present medications.                           - Repeat colonoscopy in 5 years for surveillance. Mauri Pole, MD 09/22/2021 3:48:29 PM This report has been signed electronically.

## 2021-09-22 NOTE — Progress Notes (Signed)
[facility] History and Physical   Primary Care Physician:  Hoyt Koch, MD   Reason for Procedure:  History of adenomatous colon polyps  Plan:    Surveillance colonoscopy with possible interventions as needed     HPI: Christy [name] is a very pleasant 66 y.o. female here for surveillance colonoscopy. Denies any nausea, vomiting, abdominal pain, melena or bright red blood per rectum  The risks and benefits as well as alternatives of endoscopic procedure(s) have been discussed and reviewed. All questions answered. The patient agrees to proceed.    Past Medical History:  Diagnosis Date   Acute peptic ulcer, unspecified site, with hemorrhage, without mention of obstruction    Allergy    Anxiety    Arthritis    COVID-19 09/08/2020   Disorder of bone and cartilage, unspecified    Fibromyalgia    past hx   Frozen shoulder    GERD (gastroesophageal reflux disease)    Hyperlipidemia    PT UNAWARE OF THIS FINDING   Hypertension    07/2018- pt states no HTN since losing over 70#   Hyperthyroidism 08/2013   Internal hemorrhoids    Obstructive sleep apnea (adult) (pediatric)    Osteopenia    Other specific disorder of sleep of nonorganic origin    PONV (postoperative nausea and vomiting)    Restless legs syndrome (RLS)    Sleep apnea    07/2018 pt states surgery to correct but states she still has sleep apnea, just not as bad   Tubular adenoma of colon    Ulcerative proctitis ([place])    Unspecified hypothyroidism    Vaginitis 2018   Vitamin D deficiency     Past Surgical History:  Procedure Laterality Date   ABDOMINAL HYSTERECTOMY     BREAST CYST ASPIRATION Right    CESAREAN SECTION     COLONOSCOPY N/A 05/25/2013   Procedure: COLONOSCOPY;  Surgeon: Lafayette Dragon, MD;  Location: [facility];  Service: Endoscopy;  Laterality: N/A;   COLONOSCOPY     KNEE ARTHROSCOPY Right    x 2   LAPAROSCOPY     x 4   NASAL SEPTUM SURGERY     SIGMOIDOSCOPY      TONSILLECTOMY AND ADENOIDECTOMY     UPPER GASTROINTESTINAL ENDOSCOPY      Prior to Admission medications   Medication Sig Start Date End Date Taking? Authorizing Provider  ALPRAZolam Duanne Moron) 1 MG tablet Take by mouth. 1 and 1/2 tablet 10/24/20  Yes [provider]  cholecalciferol (VITAMIN D) 400 units TABS tablet Take 400 Units by mouth.   Yes [provider]  ciclopirox (PENLAC) 8 % solution Apply topically daily. 08/28/21  Yes [provider]  fish oil-omega-3 fatty acids 1000 MG capsule Take 2 g by mouth daily.   Yes [provider]  loratadine (CLARITIN) 10 MG tablet Take 10 mg by mouth.   Yes [provider]  mesalamine (APRISO) 0.375 g 24 hr capsule Take 2 capsules (0.75 g total) by mouth daily. TAKE 2 CAPSULES BY MOUTH EVERY DAY 10/20/20  Yes Esterwood, Amy S, PA-C  ondansetron (ZOFRAN) 4 MG tablet Take 1 tablet (4 mg total) by mouth 2 (two) times daily as needed for nausea or vomiting. 10/20/20  Yes Esterwood, Amy S, PA-C  PREMARIN vaginal cream Place 0.5 g vaginally 2 (two) times a week. 06/05/18  Yes [provider]  Probiotic Product (ALIGN) 4 MG CAPS Take by mouth.   Yes [provider]  Wilmer Floor  150 MCG tablet Take 150 mcg by mouth daily. 03/17/14  Yes [provider]  Wheat Dextrin (BENEFIBER PO) Take by mouth every morning.   Yes [provider]  meclizine (ANTIVERT) 12.5 MG tablet TAKE 1 TABLET BY MOUTH 3 TIMES DAILY AS NEEDED FOR DIZZINESS. Patient not taking: Reported on 09/01/2021    [provider]    Current Outpatient Medications  Medication Sig Dispense Refill   ALPRAZolam (XANAX) 1 MG tablet Take by mouth. 1 and 1/2 tablet     cholecalciferol (VITAMIN D) 400 units TABS tablet Take 400 Units by mouth.     ciclopirox (PENLAC) 8 % solution Apply topically daily.     fish oil-omega-3 fatty acids 1000 MG capsule Take 2 g by mouth daily.     loratadine (CLARITIN) 10 MG tablet Take 10 mg by  mouth.     mesalamine (APRISO) 0.375 g 24 hr capsule Take 2 capsules (0.75 g total) by mouth daily. TAKE 2 CAPSULES BY MOUTH EVERY DAY 180 capsule 4   ondansetron (ZOFRAN) 4 MG tablet Take 1 tablet (4 mg total) by mouth 2 (two) times daily as needed for nausea or vomiting. 60 tablet 2   PREMARIN vaginal cream Place 0.5 g vaginally 2 (two) times a week.     Probiotic Product (ALIGN) 4 MG CAPS Take by mouth.     SYNTHROID 150 MCG tablet Take 150 mcg by mouth daily.  9   Wheat Dextrin (BENEFIBER PO) Take by mouth every morning.     meclizine (ANTIVERT) 12.5 MG tablet TAKE 1 TABLET BY MOUTH 3 TIMES DAILY AS NEEDED FOR DIZZINESS. (Patient not taking: Reported on 09/01/2021)     Current Facility-Administered Medications  Medication Dose Route Frequency Provider Last Rate Last Admin   0.9 %  sodium chloride infusion  500 mL Intravenous Once Mauri Pole, MD        Allergies as of 09/22/2021 - Review Complete 09/22/2021  Allergen Reaction Noted   Adhesive [tape]  05/20/2013   Bupropion  09/01/2021   Hydrocodone-ibuprofen  09/01/2021   Latex Hives 05/20/2013   Molds & smuts  09/01/2021   Oxycodone Itching 05/09/2012   Tetracycline     Tranxene [clorazepate dipotassium]  09/06/2010   Dust mite extract  01/30/2018    Family History  Problem Relation Age of Onset   Kidney disease Mother    Diabetes Mother    Diabetes Father    Other Father        died from a brain bleed   Melanoma Sister    Other Sister        hx of PE's   Lung cancer Maternal Grandfather    Breast cancer Paternal Grandmother    Colon cancer Paternal Grandmother    Leukemia Paternal Grandfather    Colon cancer Other        grandmother   Breast cancer Other    Breast cancer Other        aunt   Other Daughter        PE while on birth control   Colon polyps Neg Hx    Esophageal cancer Neg Hx    Stomach cancer Neg Hx    Rectal cancer Neg Hx     Social History   Socioeconomic History   Marital status:  Married    Spouse name: Not on file   Number of children: Not on file   Years of education: Not on file   Highest education level: Not  on file  Occupational History   Occupation: Psychologist, sport and exercise  Tobacco Use   Smoking status: Former    Packs/day: 0.10    Years: 15.00    Total pack years: 1.50    Types: Cigarettes    Quit date: 02/27/1980    Years since quitting: 41.5   Smokeless tobacco: Never  Vaping Use   Vaping Use: Never used  Substance and Sexual Activity   Alcohol use: No   Drug use: No   Sexual activity: Not on file  Other Topics Concern   Not on file  Social History Narrative   Not on file   Social Determinants of Health   Financial Resource Strain: Not on file  Food Insecurity: Not on file  Transportation Needs: Not on file  Physical Activity: Not on file  Stress: Not on file  Social Connections: Not on file  Intimate Partner Violence: Not on file    Review of Systems:  All other review of systems negative except as mentioned in the HPI.  Physical Exam: Vital signs in last 24 hours: BP 120/71   Pulse 65   Temp 98.2 F (36.8 C) (Temporal)   Resp 16   Ht 5' 7.25" (1.708 m)   Wt 132 lb (59.9 kg)   SpO2 100%   BMI 20.52 kg/m  General:   Alert, NAD Lungs:  Clear .   Heart:  Regular rate and rhythm Abdomen:  Soft, nontender and nondistended. Neuro/Psych:  Alert and cooperative. Normal mood and affect. A and O x 3  Reviewed labs, radiology imaging, old records and pertinent past GI work up  Patient is appropriate for planned procedure(s) and anesthesia in an ambulatory setting   K. Denzil Magnuson , MD [phone]

## 2021-09-25 ENCOUNTER — Telehealth: Payer: Self-pay

## 2021-09-25 ENCOUNTER — Other Ambulatory Visit: Payer: Self-pay | Admitting: Obstetrics

## 2021-09-25 NOTE — Telephone Encounter (Signed)
Post procedure follow up call, no answer 

## 2021-09-26 ENCOUNTER — Other Ambulatory Visit: Payer: Self-pay | Admitting: Obstetrics

## 2021-10-10 ENCOUNTER — Ambulatory Visit (HOSPITAL_COMMUNITY)
Admission: RE | Admit: 2021-10-10 | Discharge: 2021-10-10 | Disposition: A | Discharge status: Home / Self Care | Payer: BC Managed Care – PPO | Source: Ambulatory Visit | Attending: Obstetrics | Admitting: Obstetrics

## 2021-10-10 DIAGNOSIS — M810 Age-related osteoporosis without current pathological fracture: Secondary | ICD-10-CM | POA: Insufficient documentation

## 2021-10-10 MED ORDER — ZOLEDRONIC ACID 5 MG/100ML IV SOLN
INTRAVENOUS | Status: AC
  Administered 2021-10-10: 5 mg via INTRAVENOUS
  Filled 2021-10-10: qty 100

## 2021-10-10 MED ORDER — ZOLEDRONIC ACID 5 MG/100ML IV SOLN: 5.0000 mg | Freq: Once | INTRAVENOUS | Status: AC

## 2021-10-25 DIAGNOSIS — M8589 Other specified disorders of bone density and structure, multiple sites: Secondary | ICD-10-CM | POA: Diagnosis not present

## 2021-10-25 DIAGNOSIS — Z01419 Encounter for gynecological examination (general) (routine) without abnormal findings: Secondary | ICD-10-CM | POA: Diagnosis not present

## 2021-10-25 DIAGNOSIS — Z124 Encounter for screening for malignant neoplasm of cervix: Secondary | ICD-10-CM | POA: Diagnosis not present

## 2021-10-25 DIAGNOSIS — Z01411 Encounter for gynecological examination (general) (routine) with abnormal findings: Secondary | ICD-10-CM | POA: Diagnosis not present

## 2021-10-25 DIAGNOSIS — Z6821 Body mass index (BMI) 21.0-21.9, adult: Secondary | ICD-10-CM | POA: Diagnosis not present

## 2021-10-25 DIAGNOSIS — Z0142 Encounter for cervical smear to confirm findings of recent normal smear following initial abnormal smear: Secondary | ICD-10-CM | POA: Diagnosis not present

## 2021-10-25 DIAGNOSIS — Z1231 Encounter for screening mammogram for malignant neoplasm of breast: Secondary | ICD-10-CM | POA: Diagnosis not present

## 2021-10-25 DIAGNOSIS — Z780 Asymptomatic menopausal state: Secondary | ICD-10-CM | POA: Diagnosis not present

## 2021-10-27 DIAGNOSIS — B351 Tinea unguium: Secondary | ICD-10-CM | POA: Diagnosis not present

## 2021-10-27 DIAGNOSIS — L811 Chloasma: Secondary | ICD-10-CM | POA: Diagnosis not present

## 2021-10-27 DIAGNOSIS — D225 Melanocytic nevi of trunk: Secondary | ICD-10-CM | POA: Diagnosis not present

## 2021-10-27 DIAGNOSIS — L821 Other seborrheic keratosis: Secondary | ICD-10-CM | POA: Diagnosis not present

## 2021-10-27 DIAGNOSIS — L570 Actinic keratosis: Secondary | ICD-10-CM | POA: Diagnosis not present

## 2021-11-29 ENCOUNTER — Encounter: Payer: Self-pay | Admitting: Internal Medicine

## 2021-11-29 ENCOUNTER — Other Ambulatory Visit: Payer: Self-pay

## 2021-11-29 MED ORDER — MECLIZINE HCL 12.5 MG PO TABS: ORAL_TABLET | ORAL | 1 refills | Status: DC

## 2021-12-15 NOTE — Progress Notes (Unsigned)
Christy [name] Phone: [phone] Subjective:   Christy [name], am serving as a scribe for Dr. Hulan Saas.  I'm seeing this patient by the request  of:  Hoyt Koch, MD  CC: Right hip pain  MCN:[id number]  Christy [name] is a 66 y.o. female coming in with complaint of R glute since August. Feels like muscle has seized up and will not let go. Pain began when she traveled to the beach and sat in husband's truck. Pain is intermittent. Worse when sitting, stair climbing and doing lunges. Pain can wrap around to the front of the hip. Stretching and exercising help. Lidocaine and heat are also tx she has tried.        Past Medical History:  Diagnosis Date   Acute peptic ulcer, unspecified site, with hemorrhage, without mention of obstruction    Allergy    Anxiety    Arthritis    COVID-19 09/08/2020   Disorder of bone and cartilage, unspecified    Fibromyalgia    past hx   Frozen shoulder    GERD (gastroesophageal reflux disease)    Hyperlipidemia    PT UNAWARE OF THIS FINDING   Hypertension    07/2018- pt states [name] HTN since losing over 70#   Hyperthyroidism 08/2013   Internal hemorrhoids    Obstructive sleep apnea (adult) (pediatric)    Osteopenia    Other specific disorder of sleep of nonorganic origin    PONV (postoperative nausea and vomiting)    Restless legs syndrome (RLS)    Sleep apnea    07/2018 pt states surgery to correct but states she still has sleep apnea, just not as bad   Tubular adenoma of colon    Ulcerative proctitis ([place])    Unspecified hypothyroidism    Vaginitis 2018   Vitamin D deficiency    Past Surgical History:  Procedure Laterality Date   ABDOMINAL HYSTERECTOMY     BREAST CYST ASPIRATION Right    CESAREAN SECTION     COLONOSCOPY N/A 05/25/2013   Procedure: COLONOSCOPY;  Surgeon: Lafayette Dragon, MD;  Location: [facility];  Service: Endoscopy;  Laterality: N/A;    COLONOSCOPY     KNEE ARTHROSCOPY Right    x 2   LAPAROSCOPY     x 4   NASAL SEPTUM SURGERY     SIGMOIDOSCOPY     TONSILLECTOMY AND ADENOIDECTOMY     UPPER GASTROINTESTINAL ENDOSCOPY     Social History   Socioeconomic History   Marital status: Married    Spouse name: Not on file   Number of children: Not on file   Years of education: Not on file   Highest education level: Not on file  Occupational History   Occupation: Psychologist, sport and exercise  Tobacco Use   Smoking status: Former    Packs/day: 0.10    Years: 15.00    Total pack years: 1.50    Types: Cigarettes    Quit date: 02/27/1980    Years since quitting: 41.8   Smokeless tobacco: Never  Vaping Use   Vaping Use: Never used  Substance and Sexual Activity   Alcohol use: [name]   Drug use: [name]   Sexual activity: Not on file  Other Topics Concern   Not on file  Social History Narrative   Not on file   Social Determinants of Health   Financial Resource Strain: Not on file  Food Insecurity: Not on file  Transportation Needs: Not on file  Physical Activity: Not on file  Stress: Not on file  Social Connections: Not on file   Allergies  Allergen Reactions   Adhesive [Tape]    Bupropion    Hydrocodone-Ibuprofen    Latex Hives   Molds & Smuts    Oxycodone Itching   Tetracycline     REACTION: diarrhea   Tranxene [Clorazepate Dipotassium]     itche   Dust Mite Extract     And mold   Family History  Problem Relation Age of Onset   Kidney disease Mother    Diabetes Mother    Diabetes Father    Other Father        died from a brain bleed   Melanoma Sister    Other Sister        hx of PE's   Lung cancer Maternal Grandfather    Breast cancer Paternal Grandmother    Colon cancer Paternal Grandmother    Leukemia Paternal Grandfather    Colon cancer Other        grandmother   Breast cancer Other    Breast cancer Other        aunt   Other Daughter        PE while on birth control   Colon polyps Neg Hx     Esophageal cancer Neg Hx    Stomach cancer Neg Hx    Rectal cancer Neg Hx     Current Outpatient Medications (Endocrine & Metabolic):    SYNTHROID 591 MCG tablet, Take 150 mcg by mouth daily.   Current Outpatient Medications (Respiratory):    loratadine (CLARITIN) 10 MG tablet, Take 10 mg by mouth.    Current Outpatient Medications (Other):    ALPRAZolam (XANAX) 1 MG tablet, Take by mouth. 1 and 1/2 tablet   cholecalciferol (VITAMIN D) 400 units TABS tablet, Take 400 Units by mouth.   ciclopirox (PENLAC) 8 % solution, Apply topically daily.   fish oil-omega-3 fatty acids 1000 MG capsule, Take 2 g by mouth daily.   meclizine (ANTIVERT) 12.5 MG tablet, TAKE 1 TABLET BY MOUTH 3 TIMES DAILY AS NEEDED FOR DIZZINESS.   mesalamine (APRISO) 0.375 g 24 hr capsule, Take 2 capsules (0.75 g total) by mouth daily. TAKE 2 CAPSULES BY MOUTH EVERY DAY   ondansetron (ZOFRAN) 4 MG tablet, Take 1 tablet (4 mg total) by mouth 2 (two) times daily as needed for nausea or vomiting.   PREMARIN vaginal cream, Place 0.5 g vaginally 2 (two) times a week.   Probiotic Product (ALIGN) 4 MG CAPS, Take by mouth.   Wheat Dextrin (BENEFIBER PO), Take by mouth every morning.   Reviewed prior external information including notes and imaging from  primary care provider As well as notes that were available from care everywhere and other healthcare systems.  Past medical history, social, surgical and family history all reviewed in electronic medical record.  [name] pertanent information unless stated regarding to the chief complaint.   Review of Systems:  [name] headache, visual changes, nausea, vomiting, diarrhea, constipation, dizziness, abdominal pain, skin rash, fevers, chills, night sweats, weight loss, swollen lymph nodes, body aches, joint swelling, chest pain, shortness of breath, mood changes. POSITIVE muscle aches  Objective  Blood pressure 118/82, pulse 63, height '5\' 7"'$  (1.702 m), weight 141 lb (64 kg), SpO2 99  %.   General: [name] apparent distress alert and oriented x3 mood and affect normal, dressed appropriately.  HEENT: Pupils equal, extraocular movements intact  Respiratory: Patient's speak in full sentences and does not appear short of breath  Cardiovascular: [name] lower extremity edema, non tender, [name] erythema   Right hip exam shows the patient is tender to palpation right over the greater trochanteric area.  Positive FABER test noted.  Patient does have significant stiffness and tightness in the piriformis muscle.  Low back exam fairly unremarkable.   Procedure: Real-time Ultrasound Guided Injection of right greater trochanteric bursitis secondary to patient's body habitus Device: GE Logiq Q7 Ultrasound guided injection is preferred based studies that show increased duration, increased effect, greater accuracy, decreased procedural pain, increased response rate, and decreased cost with ultrasound guided versus blind injection.  Verbal informed consent obtained.  Time-out conducted.  Noted [name] overlying erythema, induration, or other signs of local infection.  Skin prepped in a sterile fashion.  Local anesthesia: Topical Ethyl chloride.  With sterile technique and under real time ultrasound guidance:  Greater trochanteric area was visualized and patient's bursa was noted. A 22-gauge 3 inch needle was inserted and 4 cc of 0.5% Marcaine and 1 cc of Kenalog 40 mg/dL was injected. Pictures taken Completed without difficulty  Pain immediately resolved suggesting accurate placement of the medication.  Advised to call if fevers/chills, erythema, induration, drainage, or persistent bleeding.  Impression: Technically successful ultrasound guided injection.      Impression and Recommendations:     The above documentation has been reviewed and is accurate and complete Lyndal Pulley, DO

## 2021-12-20 ENCOUNTER — Ambulatory Visit (INDEPENDENT_AMBULATORY_CARE_PROVIDER_SITE_OTHER): Payer: BC Managed Care – PPO

## 2021-12-20 ENCOUNTER — Encounter: Payer: Self-pay | Admitting: Family Medicine

## 2021-12-20 ENCOUNTER — Ambulatory Visit: Payer: Self-pay

## 2021-12-20 ENCOUNTER — Ambulatory Visit (INDEPENDENT_AMBULATORY_CARE_PROVIDER_SITE_OTHER): Payer: BC Managed Care – PPO | Admitting: Family Medicine

## 2021-12-20 VITALS — BP 118/82 | HR 63 | Ht 67.0 in | Wt 141.0 lb

## 2021-12-20 DIAGNOSIS — G5701 Lesion of sciatic nerve, right lower limb: Secondary | ICD-10-CM

## 2021-12-20 DIAGNOSIS — R102 Pelvic and perineal pain: Secondary | ICD-10-CM | POA: Diagnosis not present

## 2021-12-20 DIAGNOSIS — M25551 Pain in right hip: Secondary | ICD-10-CM

## 2021-12-20 DIAGNOSIS — M545 Low back pain, unspecified: Secondary | ICD-10-CM

## 2021-12-20 DIAGNOSIS — M7061 Trochanteric bursitis, right hip: Secondary | ICD-10-CM

## 2021-12-20 NOTE — Assessment & Plan Note (Signed)
Patient given injection and tolerated the procedure well, discussed icing regimen and home exercises.  Discussed the helpful.  Patient worked with the clinic. Exercises greater.   Medications I do expect patient to do well.  We will get x-rays to rule out any bony abnormality that could be contributing.  Follow-up with me again in 6 to 8 weeks

## 2021-12-20 NOTE — Assessment & Plan Note (Signed)
Using Netter's Orthopaedic Anatomy, reviewed with the patient the structures involved and how they related to diagnosis. The patient indicated understanding.   The patient was given a handout about classic piriformis stretching including [employer], Modified [employer], my self-described "Sink Stretch," and other piriformis rehab.  We also reviewed hip flexor and abductor strengthening, ham stretching  Rec deep massage, explained self-massage with ball Return to clinic in 6 to 8 weeks worsening pain will consider formal physical therapy

## 2021-12-20 NOTE — Patient Instructions (Signed)
Tennis ball in back pocket Exercises Xray today Injected GT See me again in 6-8 weeks

## 2022-01-03 ENCOUNTER — Other Ambulatory Visit: Payer: Self-pay | Admitting: Physician Assistant

## 2022-01-03 DIAGNOSIS — K5120 Ulcerative (chronic) proctitis without complications: Secondary | ICD-10-CM

## 2022-01-03 DIAGNOSIS — K5909 Other constipation: Secondary | ICD-10-CM

## 2022-01-03 DIAGNOSIS — R110 Nausea: Secondary | ICD-10-CM

## 2022-01-03 NOTE — Telephone Encounter (Signed)
Patient is requesting Mesalamine. Please advise.

## 2022-01-30 DIAGNOSIS — R059 Cough, unspecified: Secondary | ICD-10-CM | POA: Diagnosis not present

## 2022-01-30 DIAGNOSIS — R0981 Nasal congestion: Secondary | ICD-10-CM | POA: Diagnosis not present

## 2022-01-30 DIAGNOSIS — J019 Acute sinusitis, unspecified: Secondary | ICD-10-CM | POA: Diagnosis not present

## 2022-01-31 ENCOUNTER — Ambulatory Visit: Payer: BC Managed Care – PPO | Admitting: Family Medicine

## 2022-03-21 NOTE — Progress Notes (Signed)
[name] Phone: [phone] Subjective:   [name], am serving as a scribe for Dr. Hulan Saas.  I'm seeing this patient by the request  of:  Hoyt Koch, MD  CC: Foot pain, hip pain  IHK:[id number]  12/20/2021 Using Netter's Orthopaedic Anatomy, reviewed with the patient the structures involved and how they related to diagnosis. The patient indicated understanding.    The patient was given a handout about classic piriformis stretching including [employer], Modified [employer], my self-described "Sink Stretch," and other piriformis rehab.   We also reviewed hip flexor and abductor strengthening, ham stretching   Rec deep massage, explained self-massage with ball Return to clinic in 6 to 8 weeks worsening pain will consider formal physical therapy  Patient given injection and tolerated the procedure well, discussed icing regimen and home exercises.  Discussed the helpful.  Patient worked with the clinic. Exercises greater.     Medications I do expect patient to do well.  We will get x-rays to rule out any bony abnormality that could be contributing.  Follow-up with me again in 6 to 8 weeks   Updated 03/23/2022 Christy [name] is a 67 y.o. female coming in with complaint of R hip pain. Patient states that injection lasted 2-3 days but was very helpful for those couple of days. Has little pain today but does have some days with pain in ITB and glute med. Sitting for prolonged periods increases her pain. Movement makes her feel best. Feels like she has limited ROM and describes a locked sensation. Denies any back pain.   C/o toe pain that has developed since she switched shoes ie. Has New Balance and Brooks. Second toes are painful on both feet over DIP. Toes are now painful even when she does not have shoes on. Toe is red and sometimes will be hot.       Past Medical History:  Diagnosis Date    Acute peptic ulcer, unspecified site, with hemorrhage, without mention of obstruction    Allergy    Anxiety    Arthritis    COVID-19 09/08/2020   Disorder of bone and cartilage, unspecified    Fibromyalgia    past hx   Frozen shoulder    GERD (gastroesophageal reflux disease)    Hyperlipidemia    PT UNAWARE OF THIS FINDING   Hypertension    07/2018- pt states no HTN since losing over 70#   Hyperthyroidism 08/2013   Internal hemorrhoids    Obstructive sleep apnea (adult) (pediatric)    Osteopenia    Other specific disorder of sleep of nonorganic origin    PONV (postoperative nausea and vomiting)    Restless legs syndrome (RLS)    Sleep apnea    07/2018 pt states surgery to correct but states she still has sleep apnea, just not as bad   Tubular adenoma of colon    Ulcerative proctitis ([place])    Unspecified hypothyroidism    Vaginitis 2018   Vitamin D deficiency    Past Surgical History:  Procedure Laterality Date   ABDOMINAL HYSTERECTOMY     BREAST CYST ASPIRATION Right    CESAREAN SECTION     COLONOSCOPY N/A 05/25/2013   Procedure: COLONOSCOPY;  Surgeon: Lafayette Dragon, MD;  Location: [facility];  Service: Endoscopy;  Laterality: N/A;   COLONOSCOPY     KNEE ARTHROSCOPY Right    x 2   LAPAROSCOPY  x 4   NASAL SEPTUM SURGERY     SIGMOIDOSCOPY     TONSILLECTOMY AND ADENOIDECTOMY     UPPER GASTROINTESTINAL ENDOSCOPY     Social History   Socioeconomic History   Marital status: Married    Spouse name: Not on file   Number of children: Not on file   Years of education: Not on file   Highest education level: Not on file  Occupational History   Occupation: Psychologist, sport and exercise  Tobacco Use   Smoking status: Former    Packs/day: 0.10    Years: 15.00    Total pack years: 1.50    Types: Cigarettes    Quit date: 02/27/1980    Years since quitting: 42.0   Smokeless tobacco: Never  Vaping Use   Vaping Use: Never used  Substance and Sexual Activity   Alcohol use: No    Drug use: No   Sexual activity: Not on file  Other Topics Concern   Not on file  Social History Narrative   Not on file   Social Determinants of Health   Financial Resource Strain: Not on file  Food Insecurity: Not on file  Transportation Needs: Not on file  Physical Activity: Not on file  Stress: Not on file  Social Connections: Not on file   Allergies  Allergen Reactions   Adhesive [Tape]    Bupropion    Hydrocodone-Ibuprofen    Latex Hives   Molds & Smuts    Oxycodone Itching   Tetracycline     REACTION: diarrhea   Tranxene [Clorazepate Dipotassium]     itche   Dust Mite Extract     And mold   Family History  Problem Relation Age of Onset   Kidney disease Mother    Diabetes Mother    Diabetes Father    Other Father        died from a brain bleed   Melanoma Sister    Other Sister        hx of PE's   Lung cancer Maternal Grandfather    Breast cancer Paternal Grandmother    Colon cancer Paternal Grandmother    Leukemia Paternal Grandfather    Colon cancer Other        grandmother   Breast cancer Other    Breast cancer Other        aunt   Other Daughter        PE while on birth control   Colon polyps Neg Hx    Esophageal cancer Neg Hx    Stomach cancer Neg Hx    Rectal cancer Neg Hx     Current Outpatient Medications (Endocrine & Metabolic):    SYNTHROID 106 MCG tablet, Take 150 mcg by mouth daily.   Current Outpatient Medications (Respiratory):    loratadine (CLARITIN) 10 MG tablet, Take 10 mg by mouth.    Current Outpatient Medications (Other):    ALPRAZolam (XANAX) 1 MG tablet, Take by mouth. 1 and 1/2 tablet   cholecalciferol (VITAMIN D) 400 units TABS tablet, Take 400 Units by mouth.   ciclopirox (PENLAC) 8 % solution, Apply topically daily.   fish oil-omega-3 fatty acids 1000 MG capsule, Take 2 g by mouth daily.   meclizine (ANTIVERT) 12.5 MG tablet, TAKE 1 TABLET BY MOUTH 3 TIMES DAILY AS NEEDED FOR DIZZINESS.   mesalamine (APRISO)  0.375 g 24 hr capsule, TAKE 2 CAPSULES (0.75 G TOTAL) BY MOUTH DAILY   ondansetron (ZOFRAN) 4 MG tablet, Take 1 tablet (4  mg total) by mouth 2 (two) times daily as needed for nausea or vomiting.   PREMARIN vaginal cream, Place 0.5 g vaginally 2 (two) times a week.   Probiotic Product (ALIGN) 4 MG CAPS, Take by mouth.   Wheat Dextrin (BENEFIBER PO), Take by mouth every morning.    Objective  Blood pressure 110/80, pulse (!) 59, height '5\' 7"'$  (1.702 m), weight 143 lb (64.9 kg), SpO2 98 %.   General: No apparent distress alert and oriented x3 mood and affect normal, dressed appropriately.  HEENT: Pupils equal, extraocular movements intact  Respiratory: Patient's speak in full sentences and does not appear short of breath  Cardiovascular: No lower extremity edema, non tender, no erythema  Foot exam does show that there is a significant breakdown of the transverse arch right greater than left.  Patient does have a Morton's toe noted.  Mild redness from irritation on the top of the toe as well. Right hip still has some tenderness over the greater trochanteric area.  Some tenderness to palpation noted.  Good range of motion of the hip with very mild decrease in internal range of motion   Impression and Recommendations:    The above documentation has been reviewed and is accurate and complete Lyndal Pulley, DO

## 2022-03-23 ENCOUNTER — Ambulatory Visit (INDEPENDENT_AMBULATORY_CARE_PROVIDER_SITE_OTHER): Payer: BC Managed Care – PPO | Admitting: Family Medicine

## 2022-03-23 ENCOUNTER — Ambulatory Visit: Payer: Self-pay

## 2022-03-23 VITALS — BP 110/80 | HR 59 | Ht 67.0 in | Wt 143.0 lb

## 2022-03-23 DIAGNOSIS — M216X9 Other acquired deformities of unspecified foot: Secondary | ICD-10-CM | POA: Insufficient documentation

## 2022-03-23 DIAGNOSIS — M7061 Trochanteric bursitis, right hip: Secondary | ICD-10-CM

## 2022-03-23 DIAGNOSIS — M25551 Pain in right hip: Secondary | ICD-10-CM

## 2022-03-23 NOTE — Patient Instructions (Signed)
PT [address] [place] in the house Keep doing exercises Stretch hip flexor and strengthen abductors See me in 2 months

## 2022-03-23 NOTE — Assessment & Plan Note (Signed)
Patient is making improvement at this time.  Still has some tenderness to palpation over the lateral edge of the hip.  Still has some mild antalgic gait noted.  Negative straight leg test noted today as well.  Will continue to monitor and see if the patient is doing well with the home exercises but I do think formal physical therapy would be also beneficial for other modalities.  Patient will be referred and can start if she would like.  Follow-up again in 6 to 8 weeks

## 2022-03-23 NOTE — Assessment & Plan Note (Signed)
Transverse arch breakdown noted.  Discussed icing regimen and home exercises.  Patient is going to wear shoes and we did add metatarsal arch supports.  See if that does help out significantly.  Follow-up again in 6 to 8 weeks

## 2022-04-04 NOTE — Therapy (Signed)
OUTPATIENT PHYSICAL THERAPY LOWER EXTREMITY EVALUATION   Patient Name: Christy [name] MRN: [id number] DOB:04/30/1955, 67 y.o., female Today's Date: 04/06/2022  END OF SESSION:  PT End of Session - 04/05/22 0746     Visit Number 1    Number of Visits 12    Date for PT Re-Evaluation 06/01/22    Authorization Type BCBS    PT Start Time 1745    PT Stop Time 1830    PT Time Calculation (min) 45 min    Activity Tolerance Patient tolerated treatment well    Behavior During Therapy [facility] for tasks assessed/performed             Past Medical History:  Diagnosis Date   Acute peptic ulcer, unspecified site, with hemorrhage, without mention of obstruction    Allergy    Anxiety    Arthritis    COVID-19 09/08/2020   Disorder of bone and cartilage, unspecified    Fibromyalgia    past hx   Frozen shoulder    GERD (gastroesophageal reflux disease)    Hyperlipidemia    PT UNAWARE OF THIS FINDING   Hypertension    07/2018- pt states no HTN since losing over 70#   Hyperthyroidism 08/2013   Internal hemorrhoids    Obstructive sleep apnea (adult) (pediatric)    Osteopenia    Other specific disorder of sleep of nonorganic origin    PONV (postoperative nausea and vomiting)    Restless legs syndrome (RLS)    Sleep apnea    07/2018 pt states surgery to correct but states she still has sleep apnea, just not as bad   Tubular adenoma of colon    Ulcerative proctitis ([place])    Unspecified hypothyroidism    Vaginitis 2018   Vitamin D deficiency    Past Surgical History:  Procedure Laterality Date   ABDOMINAL HYSTERECTOMY     BREAST CYST ASPIRATION Right    CESAREAN SECTION     COLONOSCOPY N/A 05/25/2013   Procedure: COLONOSCOPY;  Surgeon: Lafayette Dragon, MD;  Location: [facility];  Service: Endoscopy;  Laterality: N/A;   COLONOSCOPY     KNEE ARTHROSCOPY Right    x 2   LAPAROSCOPY     x 4   NASAL SEPTUM SURGERY     SIGMOIDOSCOPY     TONSILLECTOMY AND ADENOIDECTOMY     UPPER  GASTROINTESTINAL ENDOSCOPY     Patient Active Problem List   Diagnosis Date Noted   Loss of transverse plantar arch 03/23/2022   Greater trochanteric bursitis of right hip 12/20/2021   Piriformis syndrome of right side 12/20/2021   Routine general medical examination at a health care facility 04/12/2021   ULCERATIVE PROCTITIS 05/05/2008   Insomnia 12/03/2007   COLONIC POLYPS 11/05/2007   Hypothyroidism 11/05/2007   Hyperlipidemia 11/05/2007   Osteoporosis 11/05/2007    PCP: Hoyt Koch, MD   REFERRING PROVIDER: Lyndal Pulley, DO   REFERRING DIAG: [phone] (ICD-10-CM) - Right hip pain M21.6X9 (ICD-10-CM) - Loss of transverse plantar arch, unspecified laterality  THERAPY DIAG: R hip pain   Rationale for Evaluation and Treatment: Rehabilitation  ONSET DATE: 8/23  SUBJECTIVE:   SUBJECTIVE STATEMENT: Christy [name] is a 67 y.o. female coming in with complaint of R hip pain. Patient states that injection lasted 2-3 days but was very helpful for those couple of days. Has little pain today but does have some days with pain in ITB and glute med. Sitting for prolonged periods increases her pain. Movement  makes her feel best. Feels like she has limited ROM and describes a locked sensation. Denies any back pain.   Reports pain and symptoms when stationary and symptoms lessened with activity an exercises.  PERTINENT HISTORY: Christy [name] is a 67 y.o. female coming in with complaint of R glute since August. Feels like muscle has seized up and will not let go. Pain began when she traveled to the beach and sat in husband's truck. Pain is intermittent. Worse when sitting, stair climbing and doing lunges. Pain can wrap around to the front of the hip. Stretching and exercising help. Lidocaine and heat are also tx she has tried.   PAIN:  Are you having pain? Yes: NPRS scale: 7/10 Pain location: R hip Pain description: ache Aggravating factors: prolonged  Relieving factors:  activity  PRECAUTIONS: None  WEIGHT BEARING RESTRICTIONS: No  FALLS:  Has patient fallen in last 6 months? No   OCCUPATION: office work  PLOF: Independent  PATIENT GOALS: To reduce and manage my R hip symptoms  NEXT MD VISIT: 4 weeks  OBJECTIVE:   DIAGNOSTIC FINDINGS: none  PATIENT SURVEYS:  FOTO 61(72 predicted)  COGNITION: Overall cognitive status: Within functional limits for tasks assessed       MUSCLE LENGTH: Hamstrings: Right 75 deg; Left 75 deg Thomas test: negative B  POSTURE: No Significant postural limitations  PALPATION: TTP  LOWER EXTREMITY ROM: WNL throughout  AROM Right eval Left eval  Hip flexion    Hip extension    Hip abduction    Hip adduction    Hip internal rotation    Hip external rotation    Knee flexion    Knee extension    Ankle dorsiflexion    Ankle plantarflexion    Ankle inversion    Ankle eversion     (Blank rows = not tested)  LOWER EXTREMITY MMT:  MMT Right eval Left eval  Hip flexion 4+ 5  Hip extension 4+ 5  Hip abduction 4+ 5  Hip adduction    Hip internal rotation    Hip external rotation    Knee flexion    Knee extension    Ankle dorsiflexion    Ankle plantarflexion    Ankle inversion    Ankle eversion     (Blank rows = not tested)  LOWER EXTREMITY SPECIAL TESTS:  Hip special tests: Saralyn Pilar (FABER) test: positive , Trendelenburg test: positive , Thomas test: negative, Ober's test: negative, Anterior hip impingement test: negative, and Piriformis test: positive , stork test positive L   FUNCTIONAL TESTS:  5 times sit to stand: 9s arms crossed  GAIT: Distance walked: 19fx2 Assistive device utilized: None Level of assistance: Complete Independence Comments: unremarkable   TODAY'S TREATMENT:                                                                                                                              DATE: 04/05/22 Eval and HEP  PATIENT EDUCATION:  Education details: Discussed  eval findings, rehab rationale and POC and patient is in agreement  Person educated: Patient Education method: Explanation Education comprehension: verbalized understanding and needs further education  HOME EXERCISE PROGRAM: Access Code: [email or web address] URL: [email or web address] Date: 04/06/2022 Prepared by: Sharlynn Oliphant  Exercises - Prone Press Up  - 2 x daily - 5 x weekly - 1 sets - 10 reps - Figure 4 Bridge  - 2 x daily - 5 x weekly - 1 sets - 15 reps - Sidelying Hip Abduction  - 2 x daily - 5 x weekly - 1 sets - 15 reps  ASSESSMENT:  CLINICAL IMPRESSION: Patient is a 67 y.o. female who was seen today for physical therapy evaluation and treatment for R hip and piriformis pain.   Symptoms have been ongoing since August 2023.  Cortisone injection into right bursa offered only several days relief of symptoms.  Today patient presents with full range of motion throughout right hip labral testing and hip scouring techniques unremarkable.  Palpation finds extreme tenderness to right piriformis with marked trigger points detected.  No leg length discrepancy could be observed however patient does present with positive stork sign on the left indicating hypomobility of left SI joint.  Lumbar spine range of motion was moderately restricted into extension.  Mild strength deficits were noted throughout the right hip primarily with gluteus medius isolation.  At this time patient is presenting with soft tissue dysfunction and right hip centered around piriformis trigger point with mild strength deficits and mild left sacroiliac hypomobility.  She is a good candidate for physical therapy to resolve pain soft tissue irritation and correct sacroiliac mechanics  OBJECTIVE IMPAIRMENTS: Abnormal gait, decreased activity tolerance, decreased endurance, decreased knowledge of use of DME, decreased mobility, difficulty walking, decreased ROM, decreased strength, impaired flexibility, improper body  mechanics, and pain.   ACTIVITY LIMITATIONS: sitting  PERSONAL FACTORS: Age, Behavior pattern, Past/current experiences, and Time since onset of injury/illness/exacerbation are also affecting patient's functional outcome.   REHAB POTENTIAL: Good  CLINICAL DECISION MAKING: Stable/uncomplicated  EVALUATION COMPLEXITY: Low   GOALS: Goals reviewed with patient? No  SHORT TERM GOALS: Target date: 04/26/2022   Patient to demonstrate independence in HEP   Baseline: [email or web address] Goal status: INITIAL  2.  Correct L SI dysfunction vis negative L stork test Baseline: positive L stork sign Goal status: INITIAL   LONG TERM GOALS: Target date: 05/17/2022    Decrease pain to 4/10 at worst Baseline: 7/10 at worst Goal status: INITIAL  2.  Increase R hip strength to 5/5 Baseline:  MMT Right eval Left eval  Hip flexion 4+ 5  Hip extension 4+ 5  Hip abduction 4+ 5   Goal status: INITIAL  3.  Increase FOTO score to 72 Baseline: 61 Goal status: INITIAL  4.  Decrease R piriformis irritability from marked to minimal. Baseline: Marked point tenderness due to TP Goal status: INITIAL     PLAN:  PT FREQUENCY: 1-2x/week  PT DURATION: 6 weeks  PLANNED INTERVENTIONS: Therapeutic exercises, Therapeutic activity, Neuromuscular re-education, Balance training, Gait training, Patient/Family education, Self Care, Joint mobilization, Dry Needling, Manual therapy, and Re-evaluation  PLAN FOR NEXT SESSION: HEP review and update, stretching of R hip structures, strengthening tasks, aerobic work   Lanice Shirts, PT 04/06/2022, 7:47 AM

## 2022-04-05 ENCOUNTER — Other Ambulatory Visit: Payer: Self-pay

## 2022-04-05 ENCOUNTER — Ambulatory Visit: Payer: BC Managed Care – PPO | Attending: Family Medicine

## 2022-04-05 DIAGNOSIS — R531 Weakness: Secondary | ICD-10-CM | POA: Insufficient documentation

## 2022-04-05 DIAGNOSIS — G5701 Lesion of sciatic nerve, right lower limb: Secondary | ICD-10-CM | POA: Insufficient documentation

## 2022-04-05 DIAGNOSIS — M25551 Pain in right hip: Secondary | ICD-10-CM | POA: Diagnosis not present

## 2022-04-05 DIAGNOSIS — M216X9 Other acquired deformities of unspecified foot: Secondary | ICD-10-CM | POA: Diagnosis not present

## 2022-04-17 ENCOUNTER — Ambulatory Visit: Payer: BC Managed Care – PPO

## 2022-04-17 DIAGNOSIS — G5701 Lesion of sciatic nerve, right lower limb: Secondary | ICD-10-CM

## 2022-04-17 DIAGNOSIS — M25551 Pain in right hip: Secondary | ICD-10-CM

## 2022-04-17 DIAGNOSIS — R531 Weakness: Secondary | ICD-10-CM

## 2022-04-17 DIAGNOSIS — M216X9 Other acquired deformities of unspecified foot: Secondary | ICD-10-CM | POA: Diagnosis not present

## 2022-04-17 NOTE — Therapy (Signed)
OUTPATIENT PHYSICAL THERAPY TREATMENT NOTE   Patient Name: Christy [name] MRN: [id number] DOB:20-Aug-1955, 67 y.o., female Today's Date: 04/17/2022  PCP: Hoyt Koch, MD   REFERRING PROVIDER: Lyndal Pulley, DO    END OF SESSION:   PT End of Session - 04/17/22 1658     Visit Number 2    Number of Visits 12    Date for PT Re-Evaluation 06/01/22    Authorization Type BCBS    PT Start Time 1700    PT Stop Time 1740    PT Time Calculation (min) 40 min    Activity Tolerance Patient tolerated treatment well    Behavior During Therapy [facility] for tasks assessed/performed             Past Medical History:  Diagnosis Date   Acute peptic ulcer, unspecified site, with hemorrhage, without mention of obstruction    Allergy    Anxiety    Arthritis    COVID-19 09/08/2020   Disorder of bone and cartilage, unspecified    Fibromyalgia    past hx   Frozen shoulder    GERD (gastroesophageal reflux disease)    Hyperlipidemia    PT UNAWARE OF THIS FINDING   Hypertension    07/2018- pt states no HTN since losing over 70#   Hyperthyroidism 08/2013   Internal hemorrhoids    Obstructive sleep apnea (adult) (pediatric)    Osteopenia    Other specific disorder of sleep of nonorganic origin    PONV (postoperative nausea and vomiting)    Restless legs syndrome (RLS)    Sleep apnea    07/2018 pt states surgery to correct but states she still has sleep apnea, just not as bad   Tubular adenoma of colon    Ulcerative proctitis ([place])    Unspecified hypothyroidism    Vaginitis 2018   Vitamin D deficiency    Past Surgical History:  Procedure Laterality Date   ABDOMINAL HYSTERECTOMY     BREAST CYST ASPIRATION Right    CESAREAN SECTION     COLONOSCOPY N/A 05/25/2013   Procedure: COLONOSCOPY;  Surgeon: Lafayette Dragon, MD;  Location: [facility];  Service: Endoscopy;  Laterality: N/A;   COLONOSCOPY     KNEE ARTHROSCOPY Right    x 2   LAPAROSCOPY     x 4   NASAL SEPTUM SURGERY      SIGMOIDOSCOPY     TONSILLECTOMY AND ADENOIDECTOMY     UPPER GASTROINTESTINAL ENDOSCOPY     Patient Active Problem List   Diagnosis Date Noted   Loss of transverse plantar arch 03/23/2022   Greater trochanteric bursitis of right hip 12/20/2021   Piriformis syndrome of right side 12/20/2021   Routine general medical examination at a health care facility 04/12/2021   ULCERATIVE PROCTITIS 05/05/2008   Insomnia 12/03/2007   COLONIC POLYPS 11/05/2007   Hypothyroidism 11/05/2007   Hyperlipidemia 11/05/2007   Osteoporosis 11/05/2007    REFERRING DIAG: M25.551 (ICD-10-CM) - Right hip pain M21.6X9 (ICD-10-CM) - Loss of transverse plantar arch, unspecified laterality   THERAPY DIAG: R hip pain    Rationale for Evaluation and Treatment Rehabilitation  PERTINENT HISTORY: Christy [name] is a 67 y.o. female coming in with complaint of R glute since August. Feels like muscle has seized up and will not let go. Pain began when she traveled to the beach and sat in husband's truck. Pain is intermittent. Worse when sitting, stair climbing and doing lunges. Pain can wrap around to the front of  the hip. Stretching and exercising help. Lidocaine and heat are also tx she has tried.     PRECAUTIONS: none  SUBJECTIVE:                                                                                                                                                                                      SUBJECTIVE STATEMENT:  Has had good days, bad days,  R hip tight but 5/10 in intensity, worse with prolonged sitting.   PAIN:  Are you having pain? Yes: NPRS scale: 5/10 Pain location: R SI region Pain description: ache Aggravating factors: prolonged sitting Relieving factors: position changes   OBJECTIVE: (objective measures completed at initial evaluation unless otherwise dated)   DIAGNOSTIC FINDINGS: none   PATIENT SURVEYS:  FOTO 61(72 predicted)   COGNITION: Overall cognitive status: Within  functional limits for tasks assessed                             MUSCLE LENGTH: Hamstrings: Right 75 deg; Left 75 deg Thomas test: negative B   POSTURE: No Significant postural limitations   PALPATION: TTP   LOWER EXTREMITY ROM: WNL throughout   AROM Right eval Left eval  Hip flexion      Hip extension      Hip abduction      Hip adduction      Hip internal rotation      Hip external rotation      Knee flexion      Knee extension      Ankle dorsiflexion      Ankle plantarflexion      Ankle inversion      Ankle eversion       (Blank rows = not tested)   LOWER EXTREMITY MMT:   MMT Right eval Left eval  Hip flexion 4+ 5  Hip extension 4+ 5  Hip abduction 4+ 5  Hip adduction      Hip internal rotation      Hip external rotation      Knee flexion      Knee extension      Ankle dorsiflexion      Ankle plantarflexion      Ankle inversion      Ankle eversion       (Blank rows = not tested)   LOWER EXTREMITY SPECIAL TESTS:  Hip special tests: Saralyn Pilar (FABER) test: positive , Trendelenburg test: positive , Thomas test: negative, Ober's test: negative, Anterior hip impingement test: negative, and Piriformis test: positive , stork test positive L    FUNCTIONAL TESTS:  5 times sit to stand: 9s  arms crossed   GAIT: Distance walked: 56fx2 Assistive device utilized: None Level of assistance: Complete Independence Comments: unremarkable     TODAY'S TREATMENT:    [facility] Adult PT Treatment:                                                DATE: 04/17/22 Therapeutic Exercise: Nustep L4 8 min Piriformis stretch R 30s x2 with towel Figure 4 bridge 15/15 R hip abduction with extension bias 15x Manual Therapy: MET to correct R SI dysfunction of anterior rotation,+ R stork, leg length and ASIS inferiorly positioned R piriformis release 6 min duration                                                                                                                         DATE: 04/05/22 Eval and HEP      PATIENT EDUCATION:  Education details: Discussed eval findings, rehab rationale and POC and patient is in agreement  Person educated: Patient Education method: Explanation Education comprehension: verbalized understanding and needs further education   HOME EXERCISE PROGRAM: Access Code: [email or web address] URL: [email or web address] Date: 04/06/2022 Prepared by: JSharlynn Oliphant  Exercises - Prone Press Up  - 2 x daily - 5 x weekly - 1 sets - 10 reps - Figure 4 Bridge  - 2 x daily - 5 x weekly - 1 sets - 15 reps - Sidelying Hip Abduction  - 2 x daily - 5 x weekly - 1 sets - 15 reps   ASSESSMENT:   CLINICAL IMPRESSION: Today's session focused on SI joint assessment finding a suspected hypomobile R SI joint with subsequent anterior rotation of R ilium.  Applied MET to correct and re-assess with - R stork sign, improved R SI mobility and less crepitus during f/u testing.  R piriformis highly irritable making strength assessment difficult.   Patient is a 67y.o. female who was seen today for physical therapy evaluation and treatment for R hip and piriformis pain.   Symptoms have been ongoing since August 2023.  Cortisone injection into right bursa offered only several days relief of symptoms.  Today patient presents with full range of motion throughout right hip labral testing and hip scouring techniques unremarkable.  Palpation finds extreme tenderness to right piriformis with marked trigger points detected.  No leg length discrepancy could be observed however patient does present with positive stork sign on the left indicating hypomobility of left SI joint.  Lumbar spine range of motion was moderately restricted into extension.  Mild strength deficits were noted throughout the right hip primarily with gluteus medius isolation.  At this time patient is presenting with soft tissue dysfunction and right hip centered around piriformis trigger point with mild  strength deficits and mild left sacroiliac hypomobility.  She is a good candidate for physical therapy to resolve pain soft tissue irritation  and correct sacroiliac mechanics   OBJECTIVE IMPAIRMENTS: Abnormal gait, decreased activity tolerance, decreased endurance, decreased knowledge of use of DME, decreased mobility, difficulty walking, decreased ROM, decreased strength, impaired flexibility, improper body mechanics, and pain.    ACTIVITY LIMITATIONS: sitting   PERSONAL FACTORS: Age, Behavior pattern, Past/current experiences, and Time since onset of injury/illness/exacerbation are also affecting patient's functional outcome.    REHAB POTENTIAL: Good   CLINICAL DECISION MAKING: Stable/uncomplicated   EVALUATION COMPLEXITY: Low     GOALS: Goals reviewed with patient? No   SHORT TERM GOALS: Target date: 04/26/2022   Patient to demonstrate independence in HEP   Baseline: [email or web address] Goal status: INITIAL   2.  Correct L SI dysfunction vis negative L stork test Baseline: positive L stork sign Goal status: INITIAL     LONG TERM GOALS: Target date: 05/17/2022     Decrease pain to 4/10 at worst Baseline: 7/10 at worst Goal status: INITIAL   2.  Increase R hip strength to 5/5 Baseline:  MMT Right eval Left eval  Hip flexion 4+ 5  Hip extension 4+ 5  Hip abduction 4+ 5    Goal status: INITIAL   3.  Increase FOTO score to 72 Baseline: 61 Goal status: INITIAL   4.  Decrease R piriformis irritability from marked to minimal. Baseline: Marked point tenderness due to TP Goal status: INITIAL         PLAN:   PT FREQUENCY: 1-2x/week   PT DURATION: 6 weeks   PLANNED INTERVENTIONS: Therapeutic exercises, Therapeutic activity, Neuromuscular re-education, Balance training, Gait training, Patient/Family education, Self Care, Joint mobilization, Dry Needling, Manual therapy, and Re-evaluation   PLAN FOR NEXT SESSION: HEP review and update, stretching of R hip structures,  strengthening tasks, aerobic work   Lanice Shirts, PT 04/17/2022, 6:20 PM

## 2022-04-19 ENCOUNTER — Ambulatory Visit: Payer: BC Managed Care – PPO

## 2022-04-19 DIAGNOSIS — G5701 Lesion of sciatic nerve, right lower limb: Secondary | ICD-10-CM | POA: Diagnosis not present

## 2022-04-19 DIAGNOSIS — M25551 Pain in right hip: Secondary | ICD-10-CM | POA: Diagnosis not present

## 2022-04-19 DIAGNOSIS — M216X9 Other acquired deformities of unspecified foot: Secondary | ICD-10-CM | POA: Diagnosis not present

## 2022-04-19 DIAGNOSIS — R531 Weakness: Secondary | ICD-10-CM | POA: Diagnosis not present

## 2022-04-19 NOTE — Therapy (Signed)
OUTPATIENT PHYSICAL THERAPY TREATMENT NOTE   Patient Name: Christy [name] MRN: [id number] DOB:February 21, 1956, 67 y.o., female Today's Date: 04/19/2022  PCP: Hoyt Koch, MD   REFERRING PROVIDER: Lyndal Pulley, DO    END OF SESSION:   PT End of Session - 04/19/22 1354     Visit Number 3    Number of Visits 12    Date for PT Re-Evaluation 06/01/22    Authorization Type [employer]    PT Start Time 1400    PT Stop Time [id number]    PT Time Calculation (min) 45 min    Activity Tolerance Patient tolerated treatment well    Behavior During Therapy [facility] for tasks assessed/performed             Past Medical History:  Diagnosis Date   Acute peptic ulcer, unspecified site, with hemorrhage, without mention of obstruction    Allergy    Anxiety    Arthritis    COVID-19 09/08/2020   Disorder of bone and cartilage, unspecified    Fibromyalgia    past hx   Frozen shoulder    GERD (gastroesophageal reflux disease)    Hyperlipidemia    PT UNAWARE OF THIS FINDING   Hypertension    07/2018- pt states no HTN since losing over 70#   Hyperthyroidism 08/2013   Internal hemorrhoids    Obstructive sleep apnea (adult) (pediatric)    Osteopenia    Other specific disorder of sleep of nonorganic origin    PONV (postoperative nausea and vomiting)    Restless legs syndrome (RLS)    Sleep apnea    07/2018 pt states surgery to correct but states she still has sleep apnea, just not as bad   Tubular adenoma of colon    Ulcerative proctitis ([place])    Unspecified hypothyroidism    Vaginitis 2018   Vitamin D deficiency    Past Surgical History:  Procedure Laterality Date   ABDOMINAL HYSTERECTOMY     BREAST CYST ASPIRATION Right    CESAREAN SECTION     COLONOSCOPY N/A 05/25/2013   Procedure: COLONOSCOPY;  Surgeon: Lafayette Dragon, MD;  Location: [facility];  Service: Endoscopy;  Laterality: N/A;   COLONOSCOPY     KNEE ARTHROSCOPY Right    x 2   LAPAROSCOPY     x 4   NASAL SEPTUM SURGERY      SIGMOIDOSCOPY     TONSILLECTOMY AND ADENOIDECTOMY     UPPER GASTROINTESTINAL ENDOSCOPY     Patient Active Problem List   Diagnosis Date Noted   Loss of transverse plantar arch 03/23/2022   Greater trochanteric bursitis of right hip 12/20/2021   Piriformis syndrome of right side 12/20/2021   Routine general medical examination at a health care facility 04/12/2021   ULCERATIVE PROCTITIS 05/05/2008   Insomnia 12/03/2007   COLONIC POLYPS 11/05/2007   Hypothyroidism 11/05/2007   Hyperlipidemia 11/05/2007   Osteoporosis 11/05/2007    REFERRING DIAG: M25.551 (ICD-10-CM) - Right hip pain M21.6X9 (ICD-10-CM) - Loss of transverse plantar arch, unspecified laterality   THERAPY DIAG: R hip pain    Rationale for Evaluation and Treatment Rehabilitation  PERTINENT HISTORY: Christy [name] is a 67 y.o. female coming in with complaint of R glute since August. Feels like muscle has seized up and will not let go. Pain began when she traveled to the beach and sat in husband's truck. Pain is intermittent. Worse when sitting, stair climbing and doing lunges. Pain can wrap around to the front of  the hip. Stretching and exercising help. Lidocaine and heat are also tx she has tried.     PRECAUTIONS: none  SUBJECTIVE:                                                                                                                                                                                      SUBJECTIVE STATEMENT:  Was painfree following last session, able to sit and sleep for longer periods of time   PAIN:  Are you having pain? Yes: NPRS scale: 5/10 Pain location: R SI region Pain description: ache Aggravating factors: prolonged sitting Relieving factors: position changes   OBJECTIVE: (objective measures completed at initial evaluation unless otherwise dated)   DIAGNOSTIC FINDINGS: none   PATIENT SURVEYS:  FOTO 61(72 predicted)   COGNITION: Overall cognitive status: Within functional  limits for tasks assessed                             MUSCLE LENGTH: Hamstrings: Right 75 deg; Left 75 deg Thomas test: negative B   POSTURE: No Significant postural limitations   PALPATION: TTP   LOWER EXTREMITY ROM: WNL throughout   AROM Right eval Left eval  Hip flexion      Hip extension      Hip abduction      Hip adduction      Hip internal rotation      Hip external rotation      Knee flexion      Knee extension      Ankle dorsiflexion      Ankle plantarflexion      Ankle inversion      Ankle eversion       (Blank rows = not tested)   LOWER EXTREMITY MMT:   MMT Right eval Left eval  Hip flexion 4+ 5  Hip extension 4+ 5  Hip abduction 4+ 5  Hip adduction      Hip internal rotation      Hip external rotation      Knee flexion      Knee extension      Ankle dorsiflexion      Ankle plantarflexion      Ankle inversion      Ankle eversion       (Blank rows = not tested)   LOWER EXTREMITY SPECIAL TESTS:  Hip special tests: Saralyn Pilar (FABER) test: positive , Trendelenburg test: positive , Thomas test: negative, Ober's test: negative, Anterior hip impingement test: negative, and Piriformis test: positive , stork test positive L    FUNCTIONAL TESTS:  5 times sit to stand: 9s arms crossed  GAIT: Distance walked: [id number] Assistive device utilized: None Level of assistance: Complete Independence Comments: unremarkable     TODAY'S TREATMENT:    [facility] Adult PT Treatment:                                                DATE: 04/19/22 Therapeutic Exercise: Piriformi stertch with towel 30s (minimal stretch reported) Supine R hip flexor stretch, with towel L, 30s x2 R hip abduction in extension bias 15x R clam in L S/L 15x with mild manual resistance Prone press 10x f/b 10x with PT OP   Manual Therapy: PA mobs L5-1 10x Grade III SI re-assess finding - stork sign B, no leg length discrepancy but an inferior R ASIS despite level PSIS's  Modalities: Trigger  Point Dry Needling Treatment: Pre-treatment instruction: Patient instructed on dry needling rationale, procedures, and possible side effects including pain during treatment (achy,cramping feeling), bruising, drop of blood, lightheadedness, nausea, sweating. Patient Consent Given: Yes Education handout provided: Yes Muscles treated: R piriformis  Needle size and number:  2 Electrical stimulation performed: Yes Parameters: N/A Treatment response/outcome: Twitch response elicited and Palpable decrease in muscle tension Post-treatment instructions: Patient instructed to expect possible mild to moderate muscle soreness later today and/or tomorrow. Patient instructed in methods to reduce muscle soreness and to continue prescribed HEP. If patient was dry needled over the lung field, patient was instructed on signs and symptoms of pneumothorax and, however unlikely, to see immediate medical attention should they occur. Patient was also educated on signs and symptoms of infection and to seek medical attention should they occur. Patient verbalized understanding of these instructions and education.   [facility]                                                [place]: 04/17/22 Therapeutic Exercise: Nustep L4 8 min Piriformis stretch R 30s x2 with towel Figure 4 bridge 15/15 R hip abduction with extension bias 15x Manual Therapy: MET to correct R SI dysfunction of anterior rotation,+ R stork, leg length and ASIS inferiorly positioned R piriformis release 6 min duration                                                                                                                        DATE: 04/05/22 Eval and HEP      PATIENT EDUCATION:  Education details: Discussed eval findings, rehab rationale and POC and patient is in agreement  Person educated: Patient Education method: Explanation Education comprehension: verbalized understanding and needs further education   HOME EXERCISE  PROGRAM: Access Code: [email or web address] URL: [email or web address] Date: 04/06/2022 Prepared by: JSharlynn Oliphant  Exercises - Prone Press Up  - 2 x daily -  5 x weekly - 1 sets - 10 reps - Figure 4 Bridge  - 2 x daily - 5 x weekly - 1 sets - 15 reps - Sidelying Hip Abduction  - 2 x daily - 5 x weekly - 1 sets - 15 reps   ASSESSMENT:   CLINICAL IMPRESSION: Marked relief of symptoms following lat session, able to sleep and sit for longer periods of time w/o aggravation of symptoms.  Today's session included TPDN followed by tasks focused on strengthening and stabilizing R lumbosacral region.  Began extension tasks to regain lumbar mobility including PA mobs   Patient is a 66 y.o. female who was seen today for physical therapy evaluation and treatment for R hip and piriformis pain.   Symptoms have been ongoing since August 2023.  Cortisone injection into right bursa offered only several days relief of symptoms.  Today patient presents with full range of motion throughout right hip labral testing and hip scouring techniques unremarkable.  Palpation finds extreme tenderness to right piriformis with marked trigger points detected.  No leg length discrepancy could be observed however patient does present with positive stork sign on the left indicating hypomobility of left SI joint.  Lumbar spine range of motion was moderately restricted into extension.  Mild strength deficits were noted throughout the right hip primarily with gluteus medius isolation.  At this time patient is presenting with soft tissue dysfunction and right hip centered around piriformis trigger point with mild strength deficits and mild left sacroiliac hypomobility.  She is a good candidate for physical therapy to resolve pain soft tissue irritation and correct sacroiliac mechanics   OBJECTIVE IMPAIRMENTS: Abnormal gait, decreased activity tolerance, decreased endurance, decreased knowledge of use of DME, decreased mobility,  difficulty walking, decreased ROM, decreased strength, impaired flexibility, improper body mechanics, and pain.    ACTIVITY LIMITATIONS: sitting   PERSONAL FACTORS: Age, Behavior pattern, Past/current experiences, and Time since onset of injury/illness/exacerbation are also affecting patient's functional outcome.    REHAB POTENTIAL: Good   CLINICAL DECISION MAKING: Stable/uncomplicated   EVALUATION COMPLEXITY: Low     GOALS: Goals reviewed with patient? No   SHORT TERM GOALS: Target date: 04/26/2022   Patient to demonstrate independence in HEP   Baseline: [email or web address] Goal status: Met   2.  Correct L SI dysfunction vis negative L stork test Baseline: positive L stork sign; 04/19/22 Negative L stork sign Goal status: Met     LONG TERM GOALS: Target date: 05/17/2022     Decrease pain to 4/10 at worst Baseline: 7/10 at worst Goal status: INITIAL   2.  Increase R hip strength to 5/5 Baseline:  MMT Right eval Left eval  Hip flexion 4+ 5  Hip extension 4+ 5  Hip abduction 4+ 5    Goal status: INITIAL   3.  Increase FOTO score to 72 Baseline: 61 Goal status: INITIAL   4.  Decrease R piriformis irritability from marked to minimal. Baseline: Marked point tenderness due to TP Goal status: INITIAL         PLAN:   PT FREQUENCY: 1-2x/week   PT DURATION: 6 weeks   PLANNED INTERVENTIONS: Therapeutic exercises, Therapeutic activity, Neuromuscular re-education, Balance training, Gait training, Patient/Family education, Self Care, Joint mobilization, Dry Needling, Manual therapy, and Re-evaluation   PLAN FOR NEXT SESSION: HEP review and update, stretching of R hip structures, strengthening tasks, aerobic work   Lanice Shirts, PT 04/19/2022, 3:46 PM

## 2022-04-20 ENCOUNTER — Ambulatory Visit (INDEPENDENT_AMBULATORY_CARE_PROVIDER_SITE_OTHER): Payer: BC Managed Care – PPO | Admitting: Internal Medicine

## 2022-04-20 ENCOUNTER — Encounter: Payer: Self-pay | Admitting: Internal Medicine

## 2022-04-20 VITALS — BP 120/86 | HR 62 | Temp 98.0°F | Ht 67.0 in | Wt 141.0 lb

## 2022-04-20 DIAGNOSIS — E782 Mixed hyperlipidemia: Secondary | ICD-10-CM | POA: Diagnosis not present

## 2022-04-20 DIAGNOSIS — F5101 Primary insomnia: Secondary | ICD-10-CM

## 2022-04-20 DIAGNOSIS — E039 Hypothyroidism, unspecified: Secondary | ICD-10-CM | POA: Diagnosis not present

## 2022-04-20 DIAGNOSIS — Z0000 Encounter for general adult medical examination without abnormal findings: Secondary | ICD-10-CM | POA: Diagnosis not present

## 2022-04-20 DIAGNOSIS — K5120 Ulcerative (chronic) proctitis without complications: Secondary | ICD-10-CM | POA: Diagnosis not present

## 2022-04-20 DIAGNOSIS — Z23 Encounter for immunization: Secondary | ICD-10-CM | POA: Diagnosis not present

## 2022-04-20 LAB — COMPREHENSIVE METABOLIC PANEL
ALT: 19 U/L (ref 0–35)
AST: 25 U/L (ref 0–37)
Albumin: 4.8 g/dL (ref 3.5–5.2)
Alkaline Phosphatase: 49 U/L (ref 39–117)
BUN: 13 mg/dL (ref 6–23)
CO2: 29 mEq/L (ref 19–32)
Calcium: 10.1 mg/dL (ref 8.4–10.5)
Chloride: 98 mEq/L (ref 96–112)
Creatinine, Ser: 0.92 mg/dL (ref 0.40–1.20)
GFR: 65.45 mL/min (ref >60.00)
Glucose, Bld: 82 mg/dL (ref 70–99)
Potassium: 4.4 mEq/L (ref 3.5–5.1)
Sodium: 137 mEq/L (ref 135–145)
Total Bilirubin: 0.6 mg/dL (ref 0.2–1.2)
Total Protein: 7.7 g/dL (ref 6.0–8.3)

## 2022-04-20 LAB — TSH: TSH: 0.48 u[IU]/mL (ref 0.35–5.50)

## 2022-04-20 LAB — CBC
HCT: 40.6 % (ref 36.0–46.0)
Hemoglobin: 13.7 g/dL (ref 12.0–15.0)
MCHC: 33.7 g/dL (ref 30.0–36.0)
MCV: 92.8 fl (ref 78.0–100.0)
Platelets: 280 10*3/uL (ref 150.0–400.0)
RBC: 4.38 Mil/uL (ref 3.87–5.11)
RDW: 13 % (ref 11.5–15.5)
WBC: 7.4 10*3/uL (ref 4.0–10.5)

## 2022-04-20 LAB — LIPID PANEL
Cholesterol: 196 mg/dL (ref 0–200)
HDL: 92.6 mg/dL (ref >39.00)
LDL Cholesterol: 92 mg/dL (ref 0–99)
NonHDL: 103.66
Total CHOL/HDL Ratio: 2
Triglycerides: 60 mg/dL (ref 0.0–149.0)
VLDL: 12 mg/dL (ref 0.0–40.0)

## 2022-04-20 LAB — C-REACTIVE PROTEIN: CRP: <1 mg/dL (ref 0.5–20.0)

## 2022-04-20 LAB — VITAMIN B12: Vitamin B-12: 344 pg/mL (ref 211–911)

## 2022-04-20 LAB — VITAMIN D 25 HYDROXY (VIT D DEFICIENCY, FRACTURES): VITD: 27.98 ng/mL — ABNORMAL LOW (ref 30.00–100.00)

## 2022-04-20 MED ORDER — MECLIZINE HCL 12.5 MG PO TABS: ORAL_TABLET | ORAL | 1 refills | Status: DC

## 2022-04-20 NOTE — Progress Notes (Signed)
   Subjective:   Patient ID: Christy [name], female    DOB: 1956/01/21, 67 y.o.   MRN: [id number]  HPI The patient is here for physical.  PMH, [facility], social history reviewed and updated  Review of Systems  Constitutional: Negative.   HENT: Negative.    Eyes: Negative.   Respiratory:  Negative for cough, chest tightness and shortness of breath.   Cardiovascular:  Negative for chest pain, palpitations and leg swelling.  Gastrointestinal:  Negative for abdominal distention, abdominal pain, constipation, diarrhea, nausea and vomiting.  Musculoskeletal:  Positive for myalgias.  Skin: Negative.   Neurological: Negative.   Psychiatric/Behavioral:  Positive for sleep disturbance.     Objective:  Physical Exam Constitutional:      Appearance: She is well-developed.  HENT:     Head: Normocephalic and atraumatic.  Cardiovascular:     Rate and Rhythm: Normal rate and regular rhythm.  Pulmonary:     Effort: Pulmonary effort is normal. No respiratory distress.     Breath sounds: Normal breath sounds. No wheezing or rales.  Abdominal:     General: Bowel sounds are normal. There is no distension.     Palpations: Abdomen is soft.     Tenderness: There is no abdominal tenderness. There is no rebound.  Musculoskeletal:        General: Tenderness present.     Cervical back: Normal range of motion.  Skin:    General: Skin is warm and dry.  Neurological:     Mental Status: She is alert and oriented to person, place, and time.     Coordination: Coordination normal.     Vitals:   04/20/22 1353  BP: 120/86  Pulse: 62  Temp: 98 F (36.7 C)  TempSrc: Oral  SpO2: 98%  Weight: 141 lb (64 kg)  Height: 5' 7"$  (1.702 m)    Assessment & Plan:  Prevnar 20 given at visit

## 2022-04-20 NOTE — Assessment & Plan Note (Signed)
Uses alprazolam for sleep and we discussed risk and benefit and QOL. She wishes to continue for now.

## 2022-04-20 NOTE — Assessment & Plan Note (Signed)
Checking lipid panel today and adjust as needed taking omega 3 FA.

## 2022-04-20 NOTE — Assessment & Plan Note (Signed)
Checking CRP and ANA to assess for other autoimmune disease.

## 2022-04-20 NOTE — Assessment & Plan Note (Signed)
Checking TSH and adjust synthroid 150 mcg daily as needed.

## 2022-04-20 NOTE — Assessment & Plan Note (Signed)
Flu shot declines. Covid-19 counseled. Pneumonia 20 given at visit. Shingrix declines. Tetanus declines. Colonoscopy up to date. Mammogram with gyn, pap smear with gyn and dexa with gyn. Counseled about sun safety and mole surveillance. Counseled about the dangers of distracted driving. Given 10 year screening recommendations.

## 2022-04-24 ENCOUNTER — Encounter: Payer: Self-pay | Admitting: Internal Medicine

## 2022-04-24 LAB — ANTI-NUCLEAR AB-TITER (ANA TITER)
ANA TITER: 1:40 {titer} — ABNORMAL HIGH
ANA Titer 1: 1:320 {titer} — ABNORMAL HIGH

## 2022-04-24 LAB — ANA,IFA RA DIAG PNL W/RFLX TIT/PATN
Anti Nuclear Antibody (ANA): POSITIVE — AB
Cyclic Citrullin Peptide Ab: <16 UNITS
Rheumatoid fact SerPl-aCnc: <14 IU/mL (ref <14)

## 2022-04-25 ENCOUNTER — Ambulatory Visit: Payer: BC Managed Care – PPO

## 2022-04-25 DIAGNOSIS — M25551 Pain in right hip: Secondary | ICD-10-CM

## 2022-04-25 DIAGNOSIS — R531 Weakness: Secondary | ICD-10-CM | POA: Diagnosis not present

## 2022-04-25 DIAGNOSIS — M216X9 Other acquired deformities of unspecified foot: Secondary | ICD-10-CM | POA: Diagnosis not present

## 2022-04-25 DIAGNOSIS — G5701 Lesion of sciatic nerve, right lower limb: Secondary | ICD-10-CM

## 2022-04-25 NOTE — Therapy (Signed)
OUTPATIENT PHYSICAL THERAPY TREATMENT NOTE   Patient Name: Christy [name] MRN: [id number] DOB:1955/08/27, 67 y.o., female Today's Date: 04/25/2022  PCP: Hoyt Koch, MD   REFERRING PROVIDER: Lyndal Pulley, DO    END OF SESSION:   PT End of Session - 04/25/22 1746     Visit Number 4    Number of Visits 12    Date for PT Re-Evaluation 06/01/22    Authorization Type BCBS    PT Start Time 1745    PT Stop Time 1825    PT Time Calculation (min) 40 min    Activity Tolerance Patient tolerated treatment well    Behavior During Therapy [facility] for tasks assessed/performed             Past Medical History:  Diagnosis Date   Acute peptic ulcer, unspecified site, with hemorrhage, without mention of obstruction    Allergy    Anxiety    Arthritis    COVID-19 09/08/2020   Disorder of bone and cartilage, unspecified    Fibromyalgia    past hx   Frozen shoulder    GERD (gastroesophageal reflux disease)    Hyperlipidemia    PT UNAWARE OF THIS FINDING   Hypertension    07/2018- pt states no HTN since losing over 70#   Hyperthyroidism 08/2013   Internal hemorrhoids    Obstructive sleep apnea (adult) (pediatric)    Osteopenia    Other specific disorder of sleep of nonorganic origin    PONV (postoperative nausea and vomiting)    Restless legs syndrome (RLS)    Sleep apnea    07/2018 pt states surgery to correct but states she still has sleep apnea, just not as bad   Tubular adenoma of colon    Ulcerative proctitis ([place])    Unspecified hypothyroidism    Vaginitis 2018   Vitamin D deficiency    Past Surgical History:  Procedure Laterality Date   ABDOMINAL HYSTERECTOMY     BREAST CYST ASPIRATION Right    CESAREAN SECTION     COLONOSCOPY N/A 05/25/2013   Procedure: COLONOSCOPY;  Surgeon: Lafayette Dragon, MD;  Location: [facility];  Service: Endoscopy;  Laterality: N/A;   COLONOSCOPY     KNEE ARTHROSCOPY Right    x 2   LAPAROSCOPY     x 4   NASAL SEPTUM SURGERY      SIGMOIDOSCOPY     TONSILLECTOMY AND ADENOIDECTOMY     UPPER GASTROINTESTINAL ENDOSCOPY     Patient Active Problem List   Diagnosis Date Noted   Loss of transverse plantar arch 03/23/2022   Greater trochanteric bursitis of right hip 12/20/2021   Piriformis syndrome of right side 12/20/2021   Routine general medical examination at a health care facility 04/12/2021   ULCERATIVE PROCTITIS 05/05/2008   Insomnia 12/03/2007   COLONIC POLYPS 11/05/2007   Hypothyroidism 11/05/2007   Hyperlipidemia 11/05/2007   Osteoporosis 11/05/2007    REFERRING DIAG: M25.551 (ICD-10-CM) - Right hip pain M21.6X9 (ICD-10-CM) - Loss of transverse plantar arch, unspecified laterality   THERAPY DIAG: R hip pain    Rationale for Evaluation and Treatment Rehabilitation  PERTINENT HISTORY: Christy [name] is a 67 y.o. female coming in with complaint of R glute since August. Feels like muscle has seized up and will not let go. Pain began when she traveled to the beach and sat in husband's truck. Pain is intermittent. Worse when sitting, stair climbing and doing lunges. Pain can wrap around to the front of  the hip. Stretching and exercising help. Lidocaine and heat are also tx she has tried.     PRECAUTIONS: none  SUBJECTIVE:                                                                                                                                                                                      SUBJECTIVE STATEMENT:  Notes 3-4 days of increased symptoms following TPDN with e-stim.  Symptoms included R low back pain/soreness as well as lateral R thigh paresthesias.   PAIN:  Are you having pain? Yes: NPRS scale: 5/10 Pain location: R SI region Pain description: ache Aggravating factors: prolonged sitting Relieving factors: position changes   OBJECTIVE: (objective measures completed at initial evaluation unless otherwise dated)   DIAGNOSTIC FINDINGS: none   PATIENT SURVEYS:  FOTO 61(72  predicted)   COGNITION: Overall cognitive status: Within functional limits for tasks assessed                             MUSCLE LENGTH: Hamstrings: Right 75 deg; Left 75 deg Thomas test: negative B   POSTURE: No Significant postural limitations   PALPATION: TTP   LOWER EXTREMITY ROM: WNL throughout   AROM Right eval Left eval  Hip flexion      Hip extension      Hip abduction      Hip adduction      Hip internal rotation      Hip external rotation      Knee flexion      Knee extension      Ankle dorsiflexion      Ankle plantarflexion      Ankle inversion      Ankle eversion       (Blank rows = not tested)   LOWER EXTREMITY MMT:   MMT Right eval Left eval  Hip flexion 4+ 5  Hip extension 4+ 5  Hip abduction 4+ 5  Hip adduction      Hip internal rotation      Hip external rotation      Knee flexion      Knee extension      Ankle dorsiflexion      Ankle plantarflexion      Ankle inversion      Ankle eversion       (Blank rows = not tested)   LOWER EXTREMITY SPECIAL TESTS:  Hip special tests: Saralyn Pilar (FABER) test: positive , Trendelenburg test: positive , Thomas test: negative, Ober's test: negative, Anterior hip impingement test: negative, and Piriformis test: positive , stork test positive L    FUNCTIONAL TESTS:  5 times sit to stand: 9s arms crossed   GAIT: Distance walked: [id number] Assistive device utilized: None Level of assistance: Complete Independence Comments: unremarkable     TODAY'S TREATMENT:    [facility] Adult PT Treatment:                                                DATE: 04/25/22 Therapeutic Exercise: Nustep L4 8 min Piriformi stertch with towel 30s (minimal stretch reported) Bridge with alternating knee extension 15/15 QL stretch 30s x2 Bil Prone press 10x f/b mobs and repeated ER/abd isometric on wall 3s x10 B Manual Therapy: PA mobs L5-1 grade III 10x ea. Segment    [facility]                                                 DATE: 04/19/22 Therapeutic Exercise: Piriformi stertch with towel 30s (minimal stretch reported) Supine R hip flexor stretch, with towel L, 30s x2 R hip abduction in extension bias 15x R clam in L S/L 15x with mild manual resistance Prone press 10x f/b 10x with PT OP   Manual Therapy: PA mobs L5-1 10x Grade III SI re-assess finding - stork sign B, no leg length discrepancy but an inferior R ASIS despite level PSIS's  Modalities: Trigger Point Dry Needling Treatment: Pre-treatment instruction: Patient instructed on dry needling rationale, procedures, and possible side effects including pain during treatment (achy,cramping feeling), bruising, drop of blood, lightheadedness, nausea, sweating. Patient Consent Given: Yes Education handout provided: Yes Muscles treated: R piriformis  Needle size and number:  2 Electrical stimulation performed: Yes Parameters: N/A Treatment response/outcome: Twitch response elicited and Palpable decrease in muscle tension Post-treatment instructions: Patient instructed to expect possible mild to moderate muscle soreness later today and/or tomorrow. Patient instructed in methods to reduce muscle soreness and to continue prescribed HEP. If patient was dry needled over the lung field, patient was instructed on signs and symptoms of pneumothorax and, however unlikely, to see immediate medical attention should they occur. Patient was also educated on signs and symptoms of infection and to seek medical attention should they occur. Patient verbalized understanding of these instructions and education.   [facility]                                                DATE: 04[facility]22 Therapeutic Exercise: Nustep L4 8 min Piriformis stretch R 30s x2 with towel Figure 4 bridge 15/15 R hip abduction with extension bias 15x Manual Therapy: MET to correct R SI dysfunction of anterior rotation,+ R stork, leg length and ASIS inferiorly positioned R piriformis  release 6 min duration  DATE: 04/05/22 Eval and HEP      PATIENT EDUCATION:  Education details: Discussed eval findings, rehab rationale and POC and patient is in agreement  Person educated: Patient Education method: Explanation Education comprehension: verbalized understanding and needs further education   HOME EXERCISE PROGRAM: Access Code: [email or web address] URL: [email or web address] Date: 04/06/2022 Prepared by: Sharlynn Oliphant   Exercises - Prone Press Up  - 2 x daily - 5 x weekly - 1 sets - 10 reps - Figure 4 Bridge  - 2 x daily - 5 x weekly - 1 sets - 15 reps - Sidelying Hip Abduction  - 2 x daily - 5 x weekly - 1 sets - 15 reps   ASSESSMENT:   CLINICAL IMPRESSION: Patient presenting with adverse effects following last session with symptoms suggestive more of SI dysfunction vs piriformis syndrome.  Positive symptoms with PA mobs over R ASIS, weakness noted on R with single leg bridge tasks. Added additional strengthening tasks to stabilize R SI joint as well as additional stretching tasks.   Patient is a 67 y.o. female who was seen today for physical therapy evaluation and treatment for R hip and piriformis pain.   Symptoms have been ongoing since August 2023.  Cortisone injection into right bursa offered only several days relief of symptoms.  Today patient presents with full range of motion throughout right hip labral testing and hip scouring techniques unremarkable.  Palpation finds extreme tenderness to right piriformis with marked trigger points detected.  No leg length discrepancy could be observed however patient does present with positive stork sign on the left indicating hypomobility of left SI joint.  Lumbar spine range of motion was moderately restricted into extension.  Mild strength deficits were noted throughout the right hip primarily with gluteus  medius isolation.  At this time patient is presenting with soft tissue dysfunction and right hip centered around piriformis trigger point with mild strength deficits and mild left sacroiliac hypomobility.  She is a good candidate for physical therapy to resolve pain soft tissue irritation and correct sacroiliac mechanics   OBJECTIVE IMPAIRMENTS: Abnormal gait, decreased activity tolerance, decreased endurance, decreased knowledge of use of DME, decreased mobility, difficulty walking, decreased ROM, decreased strength, impaired flexibility, improper body mechanics, and pain.    ACTIVITY LIMITATIONS: sitting   PERSONAL FACTORS: Age, Behavior pattern, Past/current experiences, and Time since onset of injury/illness/exacerbation are also affecting patient's functional outcome.    REHAB POTENTIAL: Good   CLINICAL DECISION MAKING: Stable/uncomplicated   EVALUATION COMPLEXITY: Low     GOALS: Goals reviewed with patient? No   SHORT TERM GOALS: Target date: 04/26/2022   Patient to demonstrate independence in HEP   Baseline: [email or web address] Goal status: Met   2.  Correct L SI dysfunction vis negative L stork test Baseline: positive L stork sign; 04/19/22 Negative L stork sign Goal status: Met     LONG TERM GOALS: Target date: 05/17/2022     Decrease pain to 4/10 at worst Baseline: 7/10 at worst Goal status: INITIAL   2.  Increase R hip strength to 5/5 Baseline:  MMT Right eval Left eval  Hip flexion 4+ 5  Hip extension 4+ 5  Hip abduction 4+ 5    Goal status: INITIAL   3.  Increase FOTO score to 72 Baseline: 61 Goal status: INITIAL   4.  Decrease R piriformis irritability from marked to minimal. Baseline: Marked point tenderness due to TP Goal status: INITIAL         PLAN:  PT FREQUENCY: 1-2x/week   PT DURATION: 6 weeks   PLANNED INTERVENTIONS: Therapeutic exercises, Therapeutic activity, Neuromuscular re-education, Balance training, Gait training, Patient/Family  education, Self Care, Joint mobilization, Dry Needling, Manual therapy, and Re-evaluation   PLAN FOR NEXT SESSION: HEP review and update, stretching of R hip structures, strengthening tasks, aerobic work   Lanice Shirts, PT 04/25/2022, 6:55 PM

## 2022-05-01 ENCOUNTER — Ambulatory Visit: Payer: Medicare Other | Attending: Family Medicine

## 2022-05-01 DIAGNOSIS — M25551 Pain in right hip: Secondary | ICD-10-CM | POA: Diagnosis not present

## 2022-05-01 DIAGNOSIS — R531 Weakness: Secondary | ICD-10-CM | POA: Diagnosis not present

## 2022-05-01 DIAGNOSIS — G5701 Lesion of sciatic nerve, right lower limb: Secondary | ICD-10-CM | POA: Diagnosis not present

## 2022-05-01 NOTE — Therapy (Addendum)
OUTPATIENT PHYSICAL THERAPY TREATMENT NOTE/DC SUMMARY   Patient Name: BRICE POTTEIGER MRN: [id number] DOB:29-Jan-1956, 67 y.o., female Today's Date: 05/01/2022  PCP: Hoyt Koch, MD   REFERRING PROVIDER: Lyndal Pulley, DO   PHYSICAL THERAPY DISCHARGE SUMMARY  Visits from Start of Care: 5  Current functional level related to goals / functional outcomes: Goals met   Remaining deficits: none   Education / Equipment: HEP   Patient agrees to discharge. Patient goals were met. Patient is being discharged due to being pleased with the current functional level.  END OF SESSION:   PT End of Session - 05/01/22 1746     Visit Number 5    Number of Visits 12    Date for PT Re-Evaluation 06/01/22    Authorization Type BCBS    PT Start Time 1745    PT Stop Time 1825    PT Time Calculation (min) 40 min    Activity Tolerance Patient tolerated treatment well    Behavior During Therapy [facility] for tasks assessed/performed             Past Medical History:  Diagnosis Date   Acute peptic ulcer, unspecified site, with hemorrhage, without mention of obstruction    Allergy    Anxiety    Arthritis    COVID-19 09/08/2020   Disorder of bone and cartilage, unspecified    Fibromyalgia    past hx   Frozen shoulder    GERD (gastroesophageal reflux disease)    Hyperlipidemia    PT UNAWARE OF THIS FINDING   Hypertension    07/2018- pt states no HTN since losing over 70#   Hyperthyroidism 08/2013   Internal hemorrhoids    Obstructive sleep apnea (adult) (pediatric)    Osteopenia    Other specific disorder of sleep of nonorganic origin    PONV (postoperative nausea and vomiting)    Restless legs syndrome (RLS)    Sleep apnea    07/2018 pt states surgery to correct but states she still has sleep apnea, just not as bad   Tubular adenoma of colon    Ulcerative proctitis ([place])    Unspecified hypothyroidism    Vaginitis 2018   Vitamin D deficiency    Past Surgical History:   Procedure Laterality Date   ABDOMINAL HYSTERECTOMY     BREAST CYST ASPIRATION Right    CESAREAN SECTION     COLONOSCOPY N/A 05/25/2013   Procedure: COLONOSCOPY;  Surgeon: Lafayette Dragon, MD;  Location: [facility];  Service: Endoscopy;  Laterality: N/A;   COLONOSCOPY     KNEE ARTHROSCOPY Right    x 2   LAPAROSCOPY     x 4   NASAL SEPTUM SURGERY     SIGMOIDOSCOPY     TONSILLECTOMY AND ADENOIDECTOMY     UPPER GASTROINTESTINAL ENDOSCOPY     Patient Active Problem List   Diagnosis Date Noted   Loss of transverse plantar arch 03/23/2022   Greater trochanteric bursitis of right hip 12/20/2021   Piriformis syndrome of right side 12/20/2021   Routine general medical examination at a health care facility 04/12/2021   ULCERATIVE PROCTITIS 05/05/2008   Insomnia 12/03/2007   COLONIC POLYPS 11/05/2007   Hypothyroidism 11/05/2007   Hyperlipidemia 11/05/2007   Osteoporosis 11/05/2007    REFERRING DIAG: M25.551 (ICD-10-CM) - Right hip pain M21.6X9 (ICD-10-CM) - Loss of transverse plantar arch, unspecified laterality   THERAPY DIAG: R hip pain    Rationale for Evaluation and Treatment Rehabilitation  PERTINENT HISTORY: Anne Boltz  Kahan is a 67 y.o. female coming in with complaint of R glute since August. Feels like muscle has seized up and will not let go. Pain began when she traveled to the beach and sat in husband's truck. Pain is intermittent. Worse when sitting, stair climbing and doing lunges. Pain can wrap around to the front of the hip. Stretching and exercising help. Lidocaine and heat are also tx she has tried.     PRECAUTIONS: none  SUBJECTIVE:                                                                                                                                                                                      SUBJECTIVE STATEMENT:  Continues to note relief following sessions but symptoms return    PAIN:  Are you having pain? Yes: NPRS scale: 5/10 Pain location: R  SI region Pain description: ache Aggravating factors: prolonged sitting Relieving factors: position changes   OBJECTIVE: (objective measures completed at initial evaluation unless otherwise dated)   DIAGNOSTIC FINDINGS: none   PATIENT SURVEYS:  FOTO 61(72 predicted)   COGNITION: Overall cognitive status: Within functional limits for tasks assessed                             MUSCLE LENGTH: Hamstrings: Right 75 deg; Left 75 deg Thomas test: negative B   POSTURE: No Significant postural limitations   PALPATION: TTP   LOWER EXTREMITY ROM: WNL throughout   AROM Right eval Left eval  Hip flexion      Hip extension      Hip abduction      Hip adduction      Hip internal rotation      Hip external rotation      Knee flexion      Knee extension      Ankle dorsiflexion      Ankle plantarflexion      Ankle inversion      Ankle eversion       (Blank rows = not tested)   LOWER EXTREMITY MMT:   MMT Right eval Left eval  Hip flexion 4+ 5  Hip extension 4+ 5  Hip abduction 4+ 5  Hip adduction      Hip internal rotation      Hip external rotation      Knee flexion      Knee extension      Ankle dorsiflexion      Ankle plantarflexion      Ankle inversion      Ankle eversion       (Blank rows = not tested)  LOWER EXTREMITY SPECIAL TESTS:  Hip special tests: Saralyn Pilar (FABER) test: positive , Trendelenburg test: positive , Thomas test: negative, Ober's test: negative, Anterior hip impingement test: negative, and Piriformis test: positive , stork test positive L    FUNCTIONAL TESTS:  5 times sit to stand: 9s arms crossed   GAIT: Distance walked: 63ftx2 Assistive device utilized: None Level of assistance: Complete Independence Comments: unremarkable     TODAY'S TREATMENT:    [facility] Adult PT Treatment:                                                DATE: 05/01/22 Therapeutic Exercise: Nustep L5 8 min SLR circles purple band 30/30 CW/CCW Bridge with alternating  knee extension 15/15 QL stretch 30s x2 Bil Prone press 10x  ER/abd isometric on wall 3s x10 B Plank on knees with hip extension 30s  Cross over lateral step ups 4 in block 10/10   [facility] Adult PT Treatment:                                                DATE: 04/25/22 Therapeutic Exercise: Nustep L4 8 min Piriformi stertch with towel 30s (minimal stretch reported) Bridge with alternating knee extension 15/15 QL stretch 30s x2 Bil Prone press 10x f/b mobs and repeated ER/abd isometric on wall 3s x10 B Manual Therapy: PA mobs L5-1 grade III 10x ea. Segment    [facility] Adult PT Treatment:                                                DATE: 04/19/22 Therapeutic Exercise: Piriformi stertch with towel 30s (minimal stretch reported) Supine R hip flexor stretch, with towel L, 30s x2 R hip abduction in extension bias 15x R clam in L S/L 15x with mild manual resistance Prone press 10x f/b 10x with PT OP   Manual Therapy: PA mobs L5-1 10x Grade III SI re-assess finding - stork sign B, no leg length discrepancy but an inferior R ASIS despite level PSIS's  Modalities: Trigger Point Dry Needling Treatment: Pre-treatment instruction: Patient instructed on dry needling rationale, procedures, and possible side effects including pain during treatment (achy,cramping feeling), bruising, drop of blood, lightheadedness, nausea, sweating. Patient Consent Given: Yes Education handout provided: Yes Muscles treated: R piriformis  Needle size and number:  2 Electrical stimulation performed: Yes Parameters: N/A Treatment response/outcome: Twitch response elicited and Palpable decrease in muscle tension Post-treatment instructions: Patient instructed to expect possible mild to moderate muscle soreness later today and/or tomorrow. Patient instructed in methods to reduce muscle soreness and to continue prescribed HEP. If patient was dry needled over the lung field, patient was instructed on signs and symptoms of  pneumothorax and, however unlikely, to see immediate medical attention should they occur. Patient was also educated on signs and symptoms of infection and to seek medical attention should they occur. Patient verbalized understanding of these instructions and education.   [facility] Adult PT Treatment:  DATE: 04/17/22 Therapeutic Exercise: Nustep L4 8 min Piriformis stretch R 30s x2 with towel Figure 4 bridge 15/15 R hip abduction with extension bias 15x Manual Therapy: MET to correct R SI dysfunction of anterior rotation,+ R stork, leg length and ASIS inferiorly positioned R piriformis release 6 min duration                                                                                                                        DATE: 04/05/22 Eval and HEP      PATIENT EDUCATION:  Education details: Discussed eval findings, rehab rationale and POC and patient is in agreement  Person educated: Patient Education method: Explanation Education comprehension: verbalized understanding and needs further education   HOME EXERCISE PROGRAM: Access Code: [email or web address] URL: [email or web address] Date: 04/06/2022 Prepared by: Sharlynn Oliphant   Exercises - Prone Press Up  - 2 x daily - 5 x weekly - 1 sets - 10 reps - Figure 4 Bridge  - 2 x daily - 5 x weekly - 1 sets - 15 reps - Sidelying Hip Abduction  - 2 x daily - 5 x weekly - 1 sets - 15 reps   ASSESSMENT:   CLINICAL IMPRESSION: Palpation finds point tenderness to R trochanteric bursitis only, piriformis tenderness resolved.  Hip ROM and mobility functional and ITB stressing does not reproduce symptoms. Hip extension and abduction strength is good and painfree.  Added cross over step ups on 4 in block w/o aggravation of symptoms.  Symptoms appear to arise from trochanteric bursitis aggravated by prolonged positioning especially sitting.   Patient is a 67 y.o. female who was seen today for physical  therapy evaluation and treatment for R hip and piriformis pain.   Symptoms have been ongoing since August 2023.  Cortisone injection into right bursa offered only several days relief of symptoms.  Today patient presents with full range of motion throughout right hip labral testing and hip scouring techniques unremarkable.  Palpation finds extreme tenderness to right piriformis with marked trigger points detected.  No leg length discrepancy could be observed however patient does present with positive stork sign on the left indicating hypomobility of left SI joint.  Lumbar spine range of motion was moderately restricted into extension.  Mild strength deficits were noted throughout the right hip primarily with gluteus medius isolation.  At this time patient is presenting with soft tissue dysfunction and right hip centered around piriformis trigger point with mild strength deficits and mild left sacroiliac hypomobility.  She is a good candidate for physical therapy to resolve pain soft tissue irritation and correct sacroiliac mechanics   OBJECTIVE IMPAIRMENTS: Abnormal gait, decreased activity tolerance, decreased endurance, decreased knowledge of use of DME, decreased mobility, difficulty walking, decreased ROM, decreased strength, impaired flexibility, improper body mechanics, and pain.    ACTIVITY LIMITATIONS: sitting   PERSONAL FACTORS: Age, Behavior pattern, Past/current experiences, and Time since onset of injury/illness/exacerbation are also affecting patient's functional outcome.    REHAB POTENTIAL: Good  CLINICAL DECISION MAKING: Stable/uncomplicated   EVALUATION COMPLEXITY: Low     GOALS: Goals reviewed with patient? No   SHORT TERM GOALS: Target date: 04/26/2022   Patient to demonstrate independence in HEP   Baseline: [email or web address] Goal status: Met   2.  Correct L SI dysfunction vis negative L stork test Baseline: positive L stork sign; 04/19/22 Negative L stork sign Goal status: Met      LONG TERM GOALS: Target date: 05/17/2022     Decrease pain to 4/10 at worst Baseline: 7/10 at worst Goal status: INITIAL   2.  Increase R hip strength to 5/5 Baseline:  MMT Right eval Left eval  Hip flexion 4+ 5  Hip extension 4+ 5  Hip abduction 4+ 5    Goal status: INITIAL   3.  Increase FOTO score to 72 Baseline: 61 Goal status: INITIAL   4.  Decrease R piriformis irritability from marked to minimal. Baseline: Marked point tenderness due to TP Goal status: INITIAL         PLAN:   PT FREQUENCY: 1-2x/week   PT DURATION: 6 weeks   PLANNED INTERVENTIONS: Therapeutic exercises, Therapeutic activity, Neuromuscular re-education, Balance training, Gait training, Patient/Family education, Self Care, Joint mobilization, Dry Needling, Manual therapy, and Re-evaluation   PLAN FOR NEXT SESSION: HEP review and update, stretching of R hip structures, strengthening tasks, aerobic work   Lanice Shirts, PT 05/01/2022, 6:49 PM

## 2022-05-08 ENCOUNTER — Ambulatory Visit: Payer: Medicare Other

## 2022-05-15 NOTE — Progress Notes (Unsigned)
Christy [name] Phone: [phone] Subjective:    I'm seeing this patient by the request  of:  Hoyt Koch, MD  CC:   [id number]  03/23/2022 Transverse arch breakdown noted.  Discussed icing regimen and home exercises.  Patient is going to wear shoes and we did add metatarsal arch supports.  See if that does help out significantly.  Follow-up again in 6 to 8 weeks   Patient is making improvement at this time. Still has some tenderness to palpation over the lateral edge of the hip. Still has some mild antalgic gait noted. Negative straight leg test noted today as well. Will continue to monitor and see if the patient is doing well with the home exercises but I do think formal physical therapy would be also beneficial for other modalities. Patient will be referred and can start if she would like. Follow-up again in 6 to 8 week   Updated 05/16/2022 Christy [name] is a 67 y.o. female coming in with complaint of hip and arch in foot pain  Onset-  Location Duration-  Character- Aggravating factors- Reliving factors-  Therapies tried-  Severity-     Past Medical History:  Diagnosis Date   Acute peptic ulcer, unspecified site, with hemorrhage, without mention of obstruction    Allergy    Anxiety    Arthritis    COVID-19 09/08/2020   Disorder of bone and cartilage, unspecified    Fibromyalgia    past hx   Frozen shoulder    GERD (gastroesophageal reflux disease)    Hyperlipidemia    PT UNAWARE OF THIS FINDING   Hypertension    07/2018- pt states no HTN since losing over 70#   Hyperthyroidism 08/2013   Internal hemorrhoids    Obstructive sleep apnea (adult) (pediatric)    Osteopenia    Other specific disorder of sleep of nonorganic origin    PONV (postoperative nausea and vomiting)    Restless legs syndrome (RLS)    Sleep apnea    07/2018 pt states surgery to correct but states she still has sleep  apnea, just not as bad   Tubular adenoma of colon    Ulcerative proctitis ([facility])    Unspecified hypothyroidism    Vaginitis 2018   Vitamin D deficiency    Past Surgical History:  Procedure Laterality Date   ABDOMINAL HYSTERECTOMY     BREAST CYST ASPIRATION Right    CESAREAN SECTION     COLONOSCOPY N/A 05/25/2013   Procedure: COLONOSCOPY;  Surgeon: Lafayette Dragon, MD;  Location: [facility];  Service: Endoscopy;  Laterality: N/A;   COLONOSCOPY     KNEE ARTHROSCOPY Right    x 2   LAPAROSCOPY     x 4   NASAL SEPTUM SURGERY     SIGMOIDOSCOPY     TONSILLECTOMY AND ADENOIDECTOMY     UPPER GASTROINTESTINAL ENDOSCOPY     Social History   Socioeconomic History   Marital status: Married    Spouse name: Not on file   Number of children: Not on file   Years of education: Not on file   Highest education level: Not on file  Occupational History   Occupation: Personal assistant  Tobacco Use   Smoking status: Former    Packs/day: 0.10    Years: 15.00    Additional pack years: 0.00    Total pack years: 1.50    Types: Cigarettes    Quit date: 02/27/1980  Years since quitting: 42.2   Smokeless tobacco: Never  Vaping Use   Vaping Use: Never used  Substance and Sexual Activity   Alcohol use: No   Drug use: No   Sexual activity: Not on file  Other Topics Concern   Not on file  Social History Narrative   Not on file   Social Determinants of Health   Financial Resource Strain: Not on file  Food Insecurity: Not on file  Transportation Needs: Not on file  Physical Activity: Not on file  Stress: Not on file  Social Connections: Not on file   Allergies  Allergen Reactions   Adhesive [Tape]    Bupropion    Hydrocodone-Ibuprofen    Latex Hives   Molds & Smuts    Oxycodone Itching   Tetracycline     REACTION: diarrhea   Tranxene [Clorazepate Dipotassium]     itche   Dust Mite Extract     And mold   Family History  Problem Relation Age of Onset   Kidney disease Mother     Diabetes Mother    Diabetes Father    Other Father        died from a brain bleed   Melanoma Sister    Other Sister        hx of PE's   Lung cancer Maternal Grandfather    Breast cancer Paternal Grandmother    Colon cancer Paternal Grandmother    Leukemia Paternal Grandfather    Colon cancer Other        grandmother   Breast cancer Other    Breast cancer Other        aunt   Other Daughter        PE while on birth control   Colon polyps Neg Hx    Esophageal cancer Neg Hx    Stomach cancer Neg Hx    Rectal cancer Neg Hx     Current Outpatient Medications (Endocrine & Metabolic):    SYNTHROID [id number] MCG tablet, Take 150 mcg by mouth daily.   zoledronic acid (RECLAST) 5 MG/100ML SOLN injection, Inject by intravenous route.   Current Outpatient Medications (Respiratory):    loratadine (CLARITIN) 10 MG tablet, Take 10 mg by mouth.    Current Outpatient Medications (Other):    ALPRAZolam (XANAX) 1 MG tablet, Take by mouth. 1 and 1/2 tablet   cefdinir (OMNICEF) 300 MG capsule, Take 300 mg by mouth 2 (two) times daily.   cholecalciferol (VITAMIN D) 400 units TABS tablet, Take 400 Units by mouth.   ciclopirox (PENLAC) 8 % solution, Apply topically daily.   fish oil-omega-3 fatty acids 1000 MG capsule, Take 2 g by mouth daily.   meclizine (ANTIVERT) 12.5 MG tablet, TAKE 1 TABLET BY MOUTH 3 TIMES DAILY AS NEEDED FOR DIZZINESS.   mesalamine (APRISO) 0.375 g 24 hr capsule, TAKE 2 CAPSULES (0.75 G TOTAL) BY MOUTH DAILY   ondansetron (ZOFRAN) 4 MG tablet, Take 1 tablet (4 mg total) by mouth 2 (two) times daily as needed for nausea or vomiting.   PREMARIN vaginal cream, Place 0.5 g vaginally 2 (two) times a week.   Probiotic Product (ALIGN) 4 MG CAPS, Take by mouth.   tretinoin (RETIN-A) 0.025 % cream, SMARTSIG:Sparingly Topical Every Evening   Wheat Dextrin (BENEFIBER PO), Take by mouth every morning.   Reviewed prior external information including notes and imaging from  primary  care provider As well as notes that were available from care everywhere and other healthcare systems.  Past medical history, social, surgical and family history all reviewed in electronic medical record.  No pertanent information unless stated regarding to the chief complaint.   Review of Systems:  No headache, visual changes, nausea, vomiting, diarrhea, constipation, dizziness, abdominal pain, skin rash, fevers, chills, night sweats, weight loss, swollen lymph nodes, body aches, joint swelling, chest pain, shortness of breath, mood changes. POSITIVE muscle aches  Objective  There were no vitals taken for this visit.   General: No apparent distress alert and oriented x3 mood and affect normal, dressed appropriately.  HEENT: Pupils equal, extraocular movements intact  Respiratory: Patient's speak in full sentences and does not appear short of breath  Cardiovascular: No lower extremity edema, non tender, no erythema      Impression and Recommendations:

## 2022-05-16 ENCOUNTER — Other Ambulatory Visit: Payer: Self-pay

## 2022-05-16 ENCOUNTER — Encounter: Payer: Self-pay | Admitting: Family Medicine

## 2022-05-16 ENCOUNTER — Ambulatory Visit (INDEPENDENT_AMBULATORY_CARE_PROVIDER_SITE_OTHER): Payer: BC Managed Care – PPO | Admitting: Family Medicine

## 2022-05-16 VITALS — BP 112/74 | HR 73 | Ht 67.0 in | Wt 141.0 lb

## 2022-05-16 DIAGNOSIS — M7061 Trochanteric bursitis, right hip: Secondary | ICD-10-CM | POA: Diagnosis not present

## 2022-05-16 DIAGNOSIS — M25551 Pain in right hip: Secondary | ICD-10-CM

## 2022-05-16 NOTE — Patient Instructions (Addendum)
Injected GT today Write [place] in 2 weeks, if not better will get MRI of hip Optimistic it will do well See me in 2-3 months

## 2022-05-16 NOTE — Assessment & Plan Note (Signed)
Patient given injection today and for this chronic worsening symptoms.  We discussed with patient about icing regimen and home exercises.  Discussed the differential with the lumbar radiculopathy as well.  Patient is to increase activity slowly over the course of next several weeks.  We discussed with patient with her failing all other conservative therapy if this comes back within 2 to 3 weeks I do think we need to consider the possibility of advanced imaging with an MRI of the right hip focusing on more the gluteal musculature and tendons.

## 2022-05-30 ENCOUNTER — Other Ambulatory Visit: Payer: Self-pay

## 2022-05-30 ENCOUNTER — Encounter: Payer: Self-pay | Admitting: Family Medicine

## 2022-05-30 DIAGNOSIS — M25551 Pain in right hip: Secondary | ICD-10-CM

## 2022-06-03 ENCOUNTER — Ambulatory Visit
Admission: RE | Admit: 2022-06-03 | Discharge: 2022-06-03 | Disposition: A | Discharge status: Home / Self Care | Payer: BC Managed Care – PPO | Source: Ambulatory Visit | Attending: Family Medicine | Admitting: Family Medicine

## 2022-06-03 DIAGNOSIS — K4190 Unilateral femoral hernia, without obstruction or gangrene, not specified as recurrent: Secondary | ICD-10-CM | POA: Diagnosis not present

## 2022-06-03 DIAGNOSIS — M24151 Other articular cartilage disorders, right hip: Secondary | ICD-10-CM | POA: Diagnosis not present

## 2022-06-03 DIAGNOSIS — S73191A Other sprain of right hip, initial encounter: Secondary | ICD-10-CM | POA: Diagnosis not present

## 2022-06-03 DIAGNOSIS — M25551 Pain in right hip: Secondary | ICD-10-CM

## 2022-06-03 DIAGNOSIS — M1611 Unilateral primary osteoarthritis, right hip: Secondary | ICD-10-CM | POA: Diagnosis not present

## 2022-06-06 ENCOUNTER — Encounter: Payer: Self-pay | Admitting: Family Medicine

## 2022-06-11 NOTE — Progress Notes (Unsigned)
[name] [address] [place] [address] Phone: [phone] Subjective:   [name], am serving as a scribe for Dr. Antoine Primas.  I'm seeing this patient by the request  of:  Myrlene Broker, MD  CC: Gluteal tendon tear  [id number]  05/16/2022 Patient given injection today and for this chronic worsening symptoms. We discussed with patient about icing regimen and home exercises. Discussed the differential with the lumbar radiculopathy as well. Patient is to increase activity slowly over the course of next several weeks. We discussed with patient with her failing all other conservative therapy if this comes back within 2 to 3 weeks I do think we need to consider the possibility of advanced imaging with an MRI of the right hip focusing on more the gluteal musculature and tendons.   Updated 06/12/2022 CHARISSE Christy [name] is a 67 y.o. female coming in with complaint of hip pain. PRP today.  Patient did have an MRI of the right hip without contrast on June 06, 2022.  Did have a labral tear noted but seem to have also a significant tendinosis of the right gluteal minimus tendon at the insertion.      Past Medical History:  Diagnosis Date   Acute peptic ulcer, unspecified site, with hemorrhage, without mention of obstruction    Allergy    Anxiety    Arthritis    COVID-19 09/08/2020   Disorder of bone and cartilage, unspecified    Fibromyalgia    past hx   Frozen shoulder    GERD (gastroesophageal reflux disease)    Hyperlipidemia    PT UNAWARE OF THIS FINDING   Hypertension    07/2018- pt states no HTN since losing over 70#   Hyperthyroidism 08/2013   Internal hemorrhoids    Obstructive sleep apnea (adult) (pediatric)    Osteopenia    Other specific disorder of sleep of nonorganic origin    PONV (postoperative nausea and vomiting)    Restless legs syndrome (RLS)    Sleep apnea    07/2018 pt states surgery to correct but states she  still has sleep apnea, just not as bad   Tubular adenoma of colon    Ulcerative proctitis ([facility])    Unspecified hypothyroidism    Vaginitis 2018   Vitamin D deficiency    Past Surgical History:  Procedure Laterality Date   ABDOMINAL HYSTERECTOMY     BREAST CYST ASPIRATION Right    CESAREAN SECTION     COLONOSCOPY N/A 05/25/2013   Procedure: COLONOSCOPY;  Surgeon: Hart Carwin, MD;  Location: [facility];  Service: Endoscopy;  Laterality: N/A;   COLONOSCOPY     KNEE ARTHROSCOPY Right    x 2   LAPAROSCOPY     x 4   NASAL SEPTUM SURGERY     SIGMOIDOSCOPY     TONSILLECTOMY AND ADENOIDECTOMY     UPPER GASTROINTESTINAL ENDOSCOPY     Social History   Socioeconomic History   Marital status: Married    Spouse name: Not on file   Number of children: Not on file   Years of education: Not on file   Highest education level: Not on file  Occupational History   Occupation: Personal assistant  Tobacco Use   Smoking status: Former    Packs/day: 0.10    Years: 15.00    Additional pack years: 0.00    Total pack years: 1.50    Types: Cigarettes    Quit date: 02/27/1980  Years since quitting: 42.3   Smokeless tobacco: Never  Vaping Use   Vaping Use: Never used  Substance and Sexual Activity   Alcohol use: No   Drug use: No   Sexual activity: Not on file  Other Topics Concern   Not on file  Social History Narrative   Not on file   Social Determinants of Health   Financial Resource Strain: Not on file  Food Insecurity: Not on file  Transportation Needs: Not on file  Physical Activity: Not on file  Stress: Not on file  Social Connections: Not on file   Allergies  Allergen Reactions   Adhesive [Tape]    Bupropion    Hydrocodone-Ibuprofen    Latex Hives   Molds & Smuts    Oxycodone Itching   Tetracycline     REACTION: diarrhea   Tranxene [Clorazepate Dipotassium]     itche   Dust Mite Extract     And mold   Family History  Problem Relation Age of Onset   Kidney  disease Mother    Diabetes Mother    Diabetes Father    Other Father        died from a brain bleed   Melanoma Sister    Other Sister        hx of PE's   Lung cancer Maternal Grandfather    Breast cancer Paternal Grandmother    Colon cancer Paternal Grandmother    Leukemia Paternal Grandfather    Colon cancer Other        grandmother   Breast cancer Other    Breast cancer Other        aunt   Other Daughter        PE while on birth control   Colon polyps Neg Hx    Esophageal cancer Neg Hx    Stomach cancer Neg Hx    Rectal cancer Neg Hx     Current Outpatient Medications (Endocrine & Metabolic):    SYNTHROID 150 MCG tablet, Take 150 mcg by mouth daily.   zoledronic acid (RECLAST) 5 MG/100ML SOLN injection, Inject by intravenous route.   Current Outpatient Medications (Respiratory):    loratadine (CLARITIN) 10 MG tablet, Take 10 mg by mouth.    Current Outpatient Medications (Other):    ALPRAZolam (XANAX) 1 MG tablet, Take by mouth. 1 and 1/2 tablet   cefdinir (OMNICEF) 300 MG capsule, Take 300 mg by mouth 2 (two) times daily.   cholecalciferol (VITAMIN D) 400 units TABS tablet, Take 400 Units by mouth.   ciclopirox (PENLAC) 8 % solution, Apply topically daily.   fish oil-omega-3 fatty acids 1000 MG capsule, Take 2 g by mouth daily.   meclizine (ANTIVERT) 12.5 MG tablet, TAKE 1 TABLET BY MOUTH 3 TIMES DAILY AS NEEDED FOR DIZZINESS.   mesalamine (APRISO) 0.375 g 24 hr capsule, TAKE 2 CAPSULES (0.75 G TOTAL) BY MOUTH DAILY   ondansetron (ZOFRAN) 4 MG tablet, Take 1 tablet (4 mg total) by mouth 2 (two) times daily as needed for nausea or vomiting.   PREMARIN vaginal cream, Place 0.5 g vaginally 2 (two) times a week.   Probiotic Product (ALIGN) 4 MG CAPS, Take by mouth.   tretinoin (RETIN-A) 0.025 % cream, SMARTSIG:Sparingly Topical Every Evening   Wheat Dextrin (BENEFIBER PO), Take by mouth every morning.    Objective  Blood pressure 122/76, pulse 64, height 5\' 7"   (1.702 m), weight 140 lb (63.5 kg), SpO2 98 %.   General: No apparent distress alert  and oriented x3 mood and affect normal, dressed appropriately.   Procedure: Real-time Ultrasound Guided Injection of right gluteus minimus tendon sheath Device: GE Logiq Q7 Ultrasound guided injection is preferred based studies that show increased duration, increased effect, greater accuracy, decreased procedural pain, increased response rate, and decreased cost with ultrasound guided versus blind injection.  Verbal informed consent obtained.  Time-out conducted.  Noted no overlying erythema, induration, or other signs of local infection.  Skin prepped in a sterile fashion.  Local anesthesia: Topical Ethyl chloride.  With sterile technique and under real time ultrasound guidance: With a 21-gauge 2 inch needle injected with 0.5 cc of 0.5% Marcaine and then injected with 5 cc of PRP Completed without difficulty  Pain immediately resolved suggesting accurate placement of the medication.  Advised to call if fevers/chills, erythema, induration, drainage, or persistent bleeding.  Impression: Technically successful ultrasound guided injection.   Impression and Recommendations:    The above documentation has been reviewed and is accurate and complete Judi Saa, DO

## 2022-06-12 ENCOUNTER — Ambulatory Visit (INDEPENDENT_AMBULATORY_CARE_PROVIDER_SITE_OTHER): Payer: Self-pay | Admitting: Family Medicine

## 2022-06-12 ENCOUNTER — Encounter: Payer: Self-pay | Admitting: Family Medicine

## 2022-06-12 ENCOUNTER — Other Ambulatory Visit: Payer: Self-pay

## 2022-06-12 VITALS — BP 122/76 | HR 64 | Ht 67.0 in | Wt 140.0 lb

## 2022-06-12 DIAGNOSIS — M25551 Pain in right hip: Secondary | ICD-10-CM

## 2022-06-12 DIAGNOSIS — M7601 Gluteal tendinitis, right hip: Secondary | ICD-10-CM | POA: Insufficient documentation

## 2022-06-12 NOTE — Assessment & Plan Note (Signed)
PRP done today, discussed for 72 hours to avoid anti-inflammatories and icing protocol.  Given exercises to increase slowly over the course of the next 3 weeks.  Discussed icing regimen and home exercises otherwise after this time.  Follow-up again in 6 weeks

## 2022-06-12 NOTE — Patient Instructions (Signed)
No ice or IBU for 3 days Heat and Tylenol are ok See me again in 6 weeks 

## 2022-06-13 ENCOUNTER — Encounter: Payer: Self-pay | Admitting: Family Medicine

## 2022-07-04 DIAGNOSIS — M797 Fibromyalgia: Secondary | ICD-10-CM | POA: Diagnosis not present

## 2022-07-04 DIAGNOSIS — M8580 Other specified disorders of bone density and structure, unspecified site: Secondary | ICD-10-CM | POA: Diagnosis not present

## 2022-07-04 DIAGNOSIS — E039 Hypothyroidism, unspecified: Secondary | ICD-10-CM | POA: Diagnosis not present

## 2022-07-04 DIAGNOSIS — E559 Vitamin D deficiency, unspecified: Secondary | ICD-10-CM | POA: Diagnosis not present

## 2022-07-10 ENCOUNTER — Telehealth: Payer: Self-pay | Admitting: Gastroenterology

## 2022-07-10 NOTE — Telephone Encounter (Signed)
Spoke with the patient. Gas pain is "horrible." Started a couple of months ago, she stopped the magnesium glycinate. She seemed to get a little better. Then she noted she was having mucous stools and last few days there has been pink/red tinging. Burning sensation with all bowel movements. She passing stool or feeling the needs to pass stool 3 to 4 times a day. She has stopped the Benefiber in case it was contributing to symptoms. No improvement with this either. She thinks she is having a flare.

## 2022-07-10 NOTE — Telephone Encounter (Signed)
Please advise patient to restart taking Benefiber 1 teaspoon 3 times daily with meals.  Use Gas-X 1 capsule up to 3 times daily as needed for excess gas.  She may possibly having an anal fissure based on her symptoms.  Please send prescription for diltiazem gel 2% and advised patient to apply 3 times daily small pea-sized amount per rectum for 4 to 6 weeks.  Please schedule office follow-up visit with me or APP next available to do a rectal exam.  Thank you

## 2022-07-10 NOTE — Telephone Encounter (Signed)
PT is experiencing a proctitis flare and is a lot of discomfort and seeking advice on treatment

## 2022-07-11 ENCOUNTER — Other Ambulatory Visit: Payer: Self-pay

## 2022-07-11 DIAGNOSIS — R110 Nausea: Secondary | ICD-10-CM

## 2022-07-11 DIAGNOSIS — K5120 Ulcerative (chronic) proctitis without complications: Secondary | ICD-10-CM

## 2022-07-11 DIAGNOSIS — K5909 Other constipation: Secondary | ICD-10-CM

## 2022-07-11 MED ORDER — MESALAMINE ER 0.375 G PO CP24
ORAL_CAPSULE | ORAL | 4 refills | Status: DC
Start: 2022-07-11 — End: 2022-07-11

## 2022-07-11 MED ORDER — DILTIAZEM GEL 2 %: CUTANEOUS | 2 refills | Status: DC

## 2022-07-11 MED ORDER — MESALAMINE ER 0.375 G PO CP24
ORAL_CAPSULE | ORAL | 4 refills | Status: DC
Start: 2022-07-11 — End: 2022-10-22

## 2022-07-11 NOTE — Telephone Encounter (Signed)
Patient called to follow up on message below she stated she was feeling quite miserable.

## 2022-07-11 NOTE — Telephone Encounter (Signed)
ok, Please advise patient to call if her symptoms do not improve.  Thanks

## 2022-07-11 NOTE — Telephone Encounter (Signed)
Left message for the patient advising of this.

## 2022-07-11 NOTE — Telephone Encounter (Signed)
Christiane Ha with [facility] called and advised they do not carry diltazem. It is a compound and needs to be sent to a special pharmacy. He is deleting the order. Please advise.

## 2022-07-11 NOTE — Telephone Encounter (Signed)
Patient called stating she would like to know what her dosage for the Mesalamine medication does Dr. Lavon Paganini recommend for her because she only has a few left and the pharmacy advised her they are out of stock.

## 2022-07-11 NOTE — Telephone Encounter (Signed)
Spoke with the provider. Patient can take 1.5 gram mesalamine daily. [facility] [place], [place]. Appointment scheduled for 07/17/22. Patient aware of plan. Will call [facility] [place] after the pharmacist lunch break, to see if they have mesalamine in stock.

## 2022-07-11 NOTE — Telephone Encounter (Signed)
Message sent to the patient. [facility] [place] has [facility] in stock. Patient instructed to contact the pharmacy about her preferred quantity to be dispensed due to the price of the mesalamine.  Prescription for diltiazem gel faxed to [facility].

## 2022-07-11 NOTE — Telephone Encounter (Signed)
Spoke with the patient.  She took an extra mesalamine. She tells me her abdomen is hurting. She is agreeable to starting Diltiazem gel.  Unable to schedule the patient until July.

## 2022-07-17 ENCOUNTER — Encounter: Payer: Self-pay | Admitting: Physician Assistant

## 2022-07-17 ENCOUNTER — Ambulatory Visit (INDEPENDENT_AMBULATORY_CARE_PROVIDER_SITE_OTHER): Payer: BC Managed Care – PPO | Admitting: Physician Assistant

## 2022-07-17 ENCOUNTER — Other Ambulatory Visit (INDEPENDENT_AMBULATORY_CARE_PROVIDER_SITE_OTHER): Payer: BC Managed Care – PPO

## 2022-07-17 VITALS — BP 90/64 | HR 59 | Ht 67.0 in | Wt 137.6 lb

## 2022-07-17 DIAGNOSIS — R1030 Lower abdominal pain, unspecified: Secondary | ICD-10-CM

## 2022-07-17 DIAGNOSIS — K5120 Ulcerative (chronic) proctitis without complications: Secondary | ICD-10-CM

## 2022-07-17 LAB — CBC WITH DIFFERENTIAL/PLATELET
Basophils Absolute: 0.1 10*3/uL (ref 0.0–0.1)
Basophils Relative: 1 % (ref 0.0–3.0)
Eosinophils Absolute: 0.9 10*3/uL — ABNORMAL HIGH (ref 0.0–0.7)
Eosinophils Relative: 9.3 % — ABNORMAL HIGH (ref 0.0–5.0)
HCT: 41.8 % (ref 36.0–46.0)
Hemoglobin: 13.5 g/dL (ref 12.0–15.0)
Lymphocytes Relative: 24 % (ref 12.0–46.0)
Lymphs Abs: 2.4 10*3/uL (ref 0.7–4.0)
MCHC: 32.2 g/dL (ref 30.0–36.0)
MCV: 93.6 fl (ref 78.0–100.0)
Monocytes Absolute: 0.8 10*3/uL (ref 0.1–1.0)
Monocytes Relative: 8.5 % (ref 3.0–12.0)
Neutro Abs: 5.6 10*3/uL (ref 1.4–7.7)
Neutrophils Relative %: 57.2 % (ref 43.0–77.0)
Platelets: 335 10*3/uL (ref 150.0–400.0)
RBC: 4.46 Mil/uL (ref 3.87–5.11)
RDW: 13.3 % (ref 11.5–15.5)
WBC: 9.8 10*3/uL (ref 4.0–10.5)

## 2022-07-17 LAB — C-REACTIVE PROTEIN: CRP: <1 mg/dL (ref 0.5–20.0)

## 2022-07-17 LAB — SEDIMENTATION RATE: Sed Rate: 23 mm/hr (ref 0–30)

## 2022-07-17 MED ORDER — MESALAMINE 1000 MG RE SUPP: 1000.0000 mg | Freq: Every day | RECTAL | 2 refills | Status: DC

## 2022-07-17 MED ORDER — DICYCLOMINE HCL 10 MG PO CAPS: ORAL_CAPSULE | ORAL | 3 refills | Status: DC

## 2022-07-17 NOTE — Progress Notes (Signed)
Subjective:    Patient ID: Christy [name], female    DOB: 17-Dec-1955, 67 y.o.   MRN: [id number]  HPI Christy [name] is a pleasant 67 year old white female, established with Dr. Lavon Paganini with history of colon polyps and ulcerative proctosigmoiditis. She was last seen in the office in August 2022 by myself and at that time was doing very well on low-dose Apriso 2 tablets daily. She had undergone colonoscopy in July 2023 due to history of multiple adenomatous polyps.  She did not have any recurrent polyps present and there is no evidence of active proctosigmoiditis.  Graded as Mayo score 0. Patient had continued on low-dose Apriso since that time and had done well until some point in March when she developed a couple of bouts of diarrhea.  She had not had any changes to her diet etc.  She had not noticed any melena or hematochezia.  She has been having a lot of difficulty over the past 8 to 9 months with bursitis, tendinitis and some radicular type symptoms in her right lower extremity.  She also has a gluteus minimus tear.  She has undergone steroid injections, had a PRP injection and is doing physical therapy.  She has for the most part avoided NSAIDs but had occasionally been taking an Advil or an Aleve. She developed some burning discomfort in her lower abdomen and in her rectum.  Last week she saw a bit of pinkish blood with the bowel movement.  She is not having any anal pain or pain with defecation.  She had called here and there was concern for possible anal fissure and she was started on diltiazem gel until she could be seen in the office. Her husband has also been quite ill which has been very stressful for her.  He has had 2 recent hospitalizations, had a carotid endarterectomy, and 2 recent MIs.  He is now at home recuperating.  Most days currently she is having normal formed bowel movements.  Review of Systems Pertinent positive and negative review of systems were noted in the above HPI section.   All other review of systems was otherwise negative.   Outpatient Encounter Medications as of 07/17/2022  Medication Sig   ALPRAZolam (XANAX) 1 MG tablet Take by mouth. 1 and 1/2 tablet   cholecalciferol (VITAMIN D) 400 units TABS tablet Take 400 Units by mouth.   ciclopirox (PENLAC) 8 % solution Apply topically daily.   dicyclomine (BENTYL) 10 MG capsule Take 1 tablet twice daily or three times daily as needed for abdominal cramping and abdominal pain.   diltiazem 2 % GEL Pea size amount per rectum three times daily.   fish oil-omega-3 fatty acids 1000 MG capsule Take 2 g by mouth daily.   loratadine (CLARITIN) 10 MG tablet Take 10 mg by mouth.   meclizine (ANTIVERT) 12.5 MG tablet TAKE 1 TABLET BY MOUTH 3 TIMES DAILY AS NEEDED FOR DIZZINESS.   mesalamine (APRISO) 0.375 g 24 hr capsule TAKE 4 CAPSULES (1.5 G TOTAL) BY MOUTH DAILY   mesalamine (CANASA) 1000 MG suppository Place 1 suppository (1,000 mg total) rectally at bedtime.   ondansetron (ZOFRAN) 4 MG tablet Take 1 tablet (4 mg total) by mouth 2 (two) times daily as needed for nausea or vomiting.   PREMARIN vaginal cream Place 0.5 g vaginally 2 (two) times a week.   Probiotic Product (ALIGN) 4 MG CAPS Take by mouth.   SYNTHROID 150 MCG tablet Take 150 mcg by mouth daily.   tretinoin (RETIN-A) 0.025 %  cream SMARTSIG:Sparingly Topical Every Evening   Wheat Dextrin (BENEFIBER PO) Take by mouth every morning.   zoledronic acid (RECLAST) 5 MG/100ML SOLN injection Inject by intravenous route.   [DISCONTINUED] cefdinir (OMNICEF) 300 MG capsule Take 300 mg by mouth 2 (two) times daily.   No facility-administered encounter medications on file as of 07/17/2022.   Allergies  Allergen Reactions   Adhesive [Tape]    Bupropion    Hydrocodone-Ibuprofen    Latex Hives   Molds & Smuts    Oxycodone Itching   Tetracycline     REACTION: diarrhea   Tranxene [Clorazepate Dipotassium]     itche   Dust Mite Extract     And mold   Patient Active  Problem List   Diagnosis Date Noted   Gluteal tendonitis of right buttock 06/12/2022   Loss of transverse plantar arch 03/23/2022   Greater trochanteric bursitis of right hip 12/20/2021   Piriformis syndrome of right side 12/20/2021   Routine general medical examination at a health care facility 04/12/2021   ULCERATIVE PROCTITIS 05/05/2008   Insomnia 12/03/2007   COLONIC POLYPS 11/05/2007   Hypothyroidism 11/05/2007   Hyperlipidemia 11/05/2007   Osteoporosis 11/05/2007   Social History   Socioeconomic History   Marital status: Married    Spouse name: Not on file   Number of children: Not on file   Years of education: Not on file   Highest education level: Not on file  Occupational History   Occupation: Manufacturing engineer  Tobacco Use   Smoking status: Former    Packs/day: 0.10    Years: 15.00    Additional pack years: 0.00    Total pack years: 1.50    Types: Cigarettes    Quit date: 02/27/1980    Years since quitting: 42.4   Smokeless tobacco: Never  Vaping Use   Vaping Use: Never used  Substance and Sexual Activity   Alcohol use: No   Drug use: No   Sexual activity: Not on file  Other Topics Concern   Not on file  Social History Narrative   Not on file   Social Determinants of Health   Financial Resource Strain: Not on file  Food Insecurity: Not on file  Transportation Needs: Not on file  Physical Activity: Not on file  Stress: Not on file  Social Connections: Not on file  Intimate Partner Violence: Not on file    Christy [name]'s family history includes Breast cancer in her paternal grandmother and other family members; Colon cancer in her paternal grandmother and another family member; Diabetes in her father and mother; Kidney disease in her mother; Leukemia in her paternal grandfather; Lung cancer in her maternal grandfather; Melanoma in her sister; Other in her daughter, father, and sister.      Objective:    Vitals:   07/17/22 1330  BP: 90/64  Pulse:  (!) 59    Physical Exam Well-developed well-nourished older white female in no acute distress, pleasant   Height, Weight, 137 BMI 21.5  HEENT; nontraumatic normocephalic, EOMI, PE R LA, sclera anicteric. Oropharynx; not examined today Neck; supple, no JVD Cardiovascular; regular rate and rhythm with S1-S2, no murmur rub or gallop Pulmonary; Clear bilaterally Abdomen; soft, there is some tenderness bilaterally in the lower quadrants, no guarding or rebound, nondistended, no palpable mass or hepatosplenomegaly, bowel sounds are active Rectal; no palpable fissure on digital exam and nontender, on a anoscopy there is inflamed somewhat edematous mucosa with adherent mucus. Skin; benign exam, no jaundice rash  or appreciable lesions Extremities; no clubbing cyanosis or edema skin warm and dry Neuro/Psych; alert and oriented x4, grossly nonfocal mood and affect appropriate        Assessment & Plan:   #9 67 year old white female with history of ulcerative proctosigmoiditis which has been quiescent over the past few years, and maintained on low-dose Apriso. Colonoscopy July 2023 with no evidence of active disease, Mayo score 0, and no recurrent polyps.  Now with 38-month history of symptoms suggestive of flare of disease.  She started with a few bouts of intermittent diarrhea then developed burning lower abdominal discomfort which is still present but not quite as bad over the past week.  She has seen some pinkish blood, no overt melena or hematochezia, most days having regular bowel movements.  She is also feeling some rectal discomfort.  She had been empirically placed on diltiazem gel when she had called with concerns about possible anal fissure but on exam and conversation with her today there is no evidence of anal fissure and I think her symptoms are secondary to proctitis.  #2 history of adenomatous colon polyps-up-to-date with colonoscopy last done July 2023 no recurrent polyps and indicated  for 5-year interval follow-up #3 significant orthopedic issues recently involving her right hip and lower extremity.  Plan; Apriso was increased last week to 4 tablets daily, continue at higher dose. Add Canasa suppositories nightly x 1 month, may repeat for another month depending on her response. Stop diltiazem gel Start trial of dicyclomine 10 mg p.o. twice daily to 3 times daily for abdominal discomfort/burning. No NSAIDs I think she has been under a lot of stress with her orthopedic issues, and now with husband's illness both likely factors and recent flare. Of asked her to call back in 2 weeks and speak to my nurse with a progress report.  If she is not having any significant improvement in symptoms she may require a course of budesonide or steroids. We will arrange a follow-up with Dr. Lavon Paganini  and I am happy to see  her as needed. Check CBC with differential today, sed rate and CRP.  Christy [name] Oswald Hillock PA-C 07/17/2022   Cc: Myrlene Broker, *

## 2022-07-17 NOTE — Patient Instructions (Addendum)
_______________________________________________________  If your blood pressure at your visit was 140/90 or greater, please contact your primary care physician to follow up on this. _______________________________________________________  If you are age 66 or older, your body mass index should be between 23-30. Your Body mass index is 21.55 kg/m. If this is out of the aforementioned range listed, please consider follow up with your Primary Care Provider. ________________________________________________________  The [facility] providers would like to encourage you to use [facility] to communicate with providers for non-urgent requests or questions.  Due to long hold times on the telephone, sending your provider a message by [facility] may be a faster and more efficient way to get a response.  Please allow 48 business hours for a response.  Please remember that this is for non-urgent requests.  _______________________________________________________  Christy [name] 0.375 take 4 capsules daily We have sent the following medications to your pharmacy for you to pick up at your convenience:  START: Bentyl 10mg  one tablet twice daily up to three times daily as needed for abdominal cramping and pain  START: Canasa suppositories use at bedtime each night  Please call back in 2 weeks and speak with Waynetta Sandy, LPN with update on how you are feeling.  Thank you for entrusting me with your care and choosing [facility].  Amy Esterwood, PA-C

## 2022-07-18 ENCOUNTER — Ambulatory Visit: Payer: BC Managed Care – PPO | Admitting: Family Medicine

## 2022-07-24 NOTE — Progress Notes (Unsigned)
[name] [address] [place] [address] Phone: [phone] Subjective:   Christy [name], am serving as a scribe for Dr. Antoine [name].  I'm seeing this patient by the request  of:  Christy Broker, MD  CC: right hip and glut pain   [id number]  06/12/2022 PRP done today, discussed for 72 hours to avoid anti-inflammatories and icing protocol. Given exercises to increase slowly over the course of the next 3 weeks. Discussed icing regimen and home exercises otherwise after this time. Follow-up again in 6 weeks   Updated 07/25/2022 Christy [name] is a 67 y.o. female coming in with complaint of R hip/gluteal pain. Doing better, but still having pain sitting, standing, and walking. Decrease in frequency and intensity. Wanting to know what to do from here. Also recently found out she has colitis.       Past Medical History:  Diagnosis Date   Acute peptic ulcer, unspecified site, with hemorrhage, without mention of obstruction    Allergy    Anxiety    Arthritis    COVID-19 09/08/2020   Disorder of bone and cartilage, unspecified    Fibromyalgia    past hx   Frozen shoulder    GERD (gastroesophageal reflux disease)    Hyperlipidemia    PT UNAWARE OF THIS FINDING   Hypertension    07/2018- pt states no HTN since losing over 70#   Hyperthyroidism 08/2013   Internal hemorrhoids    Obstructive sleep apnea (adult) (pediatric)    Osteopenia    Other specific disorder of sleep of nonorganic origin    PONV (postoperative nausea and vomiting)    Restless legs syndrome (RLS)    Sleep apnea    07/2018 pt states surgery to correct but states she still has sleep apnea, just not as bad   Tubular adenoma of colon    Ulcerative proctitis (HCC)    Unspecified hypothyroidism    Vaginitis 2018   Vitamin D deficiency    Past Surgical History:  Procedure Laterality Date   ABDOMINAL HYSTERECTOMY     BREAST CYST ASPIRATION Right    CESAREAN  SECTION     COLONOSCOPY N/A 05/25/2013   Procedure: COLONOSCOPY;  Surgeon: Hart Carwin, MD;  Location: [facility];  Service: Endoscopy;  Laterality: N/A;   COLONOSCOPY     KNEE ARTHROSCOPY Right    x 2   LAPAROSCOPY     x 4   NASAL SEPTUM SURGERY     SIGMOIDOSCOPY     TONSILLECTOMY AND ADENOIDECTOMY     UPPER GASTROINTESTINAL ENDOSCOPY     Social History   Socioeconomic History   Marital status: Married    Spouse name: Not on file   Number of children: Not on file   Years of education: Not on file   Highest education level: Not on file  Occupational History   Occupation: Manufacturing engineer  Tobacco Use   Smoking status: Former    Packs/day: 0.10    Years: 15.00    Additional pack years: 0.00    Total pack years: 1.50    Types: Cigarettes    Quit date: 02/27/1980    Years since quitting: 42.4   Smokeless tobacco: Never  Vaping Use   Vaping Use: Never used  Substance and Sexual Activity   Alcohol use: No   Drug use: No   Sexual activity: Not on file  Other Topics Concern   Not on file  Social History Narrative  Not on file   Social Determinants of Health   Financial Resource Strain: Not on file  Food Insecurity: Not on file  Transportation Needs: Not on file  Physical Activity: Not on file  Stress: Not on file  Social Connections: Not on file   Allergies  Allergen Reactions   Adhesive [Tape]    Bupropion    Hydrocodone-Ibuprofen    Latex Hives   Molds & Smuts    Oxycodone Itching   Tetracycline     REACTION: diarrhea   Tranxene [Clorazepate Dipotassium]     itche   Dust Mite Extract     And mold   Family History  Problem Relation Age of Onset   Kidney disease Mother    Diabetes Mother    Diabetes Father    Other Father        died from a brain bleed   Melanoma Sister    Other Sister        hx of PE's   Lung cancer Maternal Grandfather    Breast cancer Paternal Grandmother    Colon cancer Paternal Grandmother    Leukemia Paternal  Grandfather    Colon cancer Other        grandmother   Breast cancer Other    Breast cancer Other        aunt   Other Daughter        PE while on birth control   Colon polyps Neg Hx    Esophageal cancer Neg Hx    Stomach cancer Neg Hx    Rectal cancer Neg Hx     Current Outpatient Medications (Endocrine & Metabolic):    SYNTHROID 150 MCG tablet, Take 150 mcg by mouth daily.   zoledronic acid (RECLAST) 5 MG/100ML SOLN injection, Inject by intravenous route.  Current Outpatient Medications (Cardiovascular):    diltiazem 2 % GEL*, Pea size amount per rectum three times daily.  Current Outpatient Medications (Respiratory):    loratadine (CLARITIN) 10 MG tablet, Take 10 mg by mouth.    Current Outpatient Medications (Other):    ALPRAZolam (XANAX) 1 MG tablet, Take by mouth. 1 and 1/2 tablet   cholecalciferol (VITAMIN D) 400 units TABS tablet, Take 400 Units by mouth.   ciclopirox (PENLAC) 8 % solution, Apply topically daily.   dicyclomine (BENTYL) 10 MG capsule, Take 1 tablet twice daily or three times daily as needed for abdominal cramping and abdominal pain.   diltiazem 2 % GEL*, Pea size amount per rectum three times daily.   fish oil-omega-3 fatty acids 1000 MG capsule, Take 2 g by mouth daily.   meclizine (ANTIVERT) 12.5 MG tablet, TAKE 1 TABLET BY MOUTH 3 TIMES DAILY AS NEEDED FOR DIZZINESS.   mesalamine (APRISO) 0.375 g 24 hr capsule, TAKE 4 CAPSULES (1.5 G TOTAL) BY MOUTH DAILY   mesalamine (CANASA) 1000 MG suppository, Place 1 suppository (1,000 mg total) rectally at bedtime.   ondansetron (ZOFRAN) 4 MG tablet, Take 1 tablet (4 mg total) by mouth 2 (two) times daily as needed for nausea or vomiting.   PREMARIN vaginal cream, Place 0.5 g vaginally 2 (two) times a week.   Probiotic Product (ALIGN) 4 MG CAPS, Take by mouth.   tretinoin (RETIN-A) 0.025 % cream, SMARTSIG:Sparingly Topical Every Evening   Wheat Dextrin (BENEFIBER PO), Take by mouth every morning. * These  medications belong to multiple therapeutic classes and are listed under each applicable group.   Reviewed prior external information including notes and imaging from  primary  care provider As well as notes that were available from care everywhere and other healthcare systems.  Past medical history, social, surgical and family history all reviewed in electronic medical record.  No pertanent information unless stated regarding to the chief complaint.   Review of Systems:  No headache, visual changes, nausea, vomiting, diarrhea, constipation, dizziness, abdominal pain, skin rash, fevers, chills, night sweats, weight loss, swollen lymph nodes, body aches, joint swelling, chest pain, shortness of breath, mood changes. POSITIVE muscle aches  Objective  Blood pressure 118/74, pulse 65, height 5\' 7"  (1.702 m), weight 139 lb (63 kg), SpO2 96 %.   General: No apparent distress alert and oriented x3 mood and affect normal, dressed appropriately.  HEENT: Pupils equal, extraocular movements intact  Respiratory: Patient's speak in full sentences and does not appear short of breath  Cardiovascular: No lower extremity edema, non tender, no erythema  Right hip less range of motion with internal range of motion noted. The patient is still tender to palpation over the greater trochanteric area. He overall is able to ambulate without any significant difficulty.   Impression and Recommendations:    The above documentation has been reviewed and is accurate and complete Judi Saa, DO

## 2022-07-25 ENCOUNTER — Ambulatory Visit (INDEPENDENT_AMBULATORY_CARE_PROVIDER_SITE_OTHER): Payer: BC Managed Care – PPO | Admitting: Family Medicine

## 2022-07-25 VITALS — BP 118/74 | HR 65 | Ht 67.0 in | Wt 139.0 lb

## 2022-07-25 DIAGNOSIS — M7601 Gluteal tendinitis, right hip: Secondary | ICD-10-CM

## 2022-07-25 NOTE — Patient Instructions (Signed)
Do prescribed exercises at least 3x a week  Walk 20 mins increase 5 mins a week See you again in 6 weeks

## 2022-07-25 NOTE — Assessment & Plan Note (Addendum)
Discussed HEP  Discussed which activities to avoid patient given different exercises that I do think would be more beneficial.  I am hopeful that actually the PRP seems to be helping.  Seems to be stronger at the moment.  We do need to increase the activity but feels that patient was doing just a little too much immediately.  Discussed which activities to do and which ones to avoid.  Follow-up again in 6 to 8 weeks we did discuss the potential need for the labral tear if she would want to talk to surgeon for surgical intervention.  Patient wants to avoid that.  Has had a lot of stress recently secondary to her husband.  He is doing much better overall.  Would recommend giving her another 6 to 8 weeks to see how she responds.  Otherwise consider also intra-articular hip injection total time reviewing patient's MRI and discussing with patient 31 minutes

## 2022-08-29 IMAGING — CT CT NECK W/ CM
5 of 6 series · 14 of 33 positions shown, 16 images · IV contrast (iopamidol)
Comparison: None.

CLINICAL DATA: Chronic right ear pain and congestion for 2 months

EXAM:
CT NECK WITH CONTRAST
TECHNIQUE: Multidetector CT imaging of the neck was performed using the
standard protocol following the bolus administration of intravenous
contrast.
CONTRAST:  75mL [id number] IOPAMIDOL ([id number]) INJECTION 61%

[Series 2: neck 2.00 br40 s3 st/ no angle · axial · 0.38mm/px · z∈[-756,-666]mm · 2 of 134 slices shown, 3 images]
[im 45/134  soft-tissue]
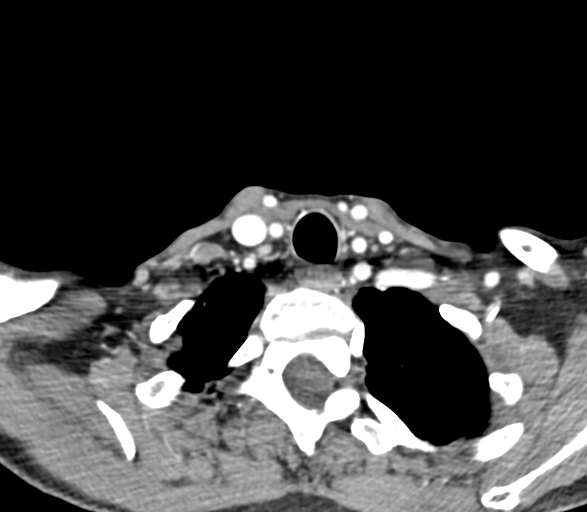
[im 45/134  bone]
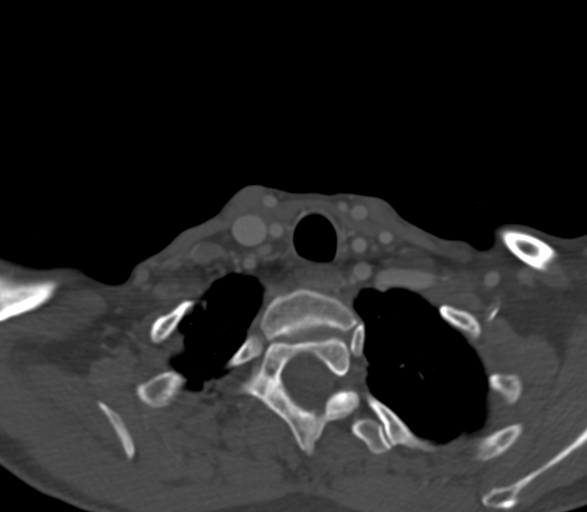
[im 89/134  bone]
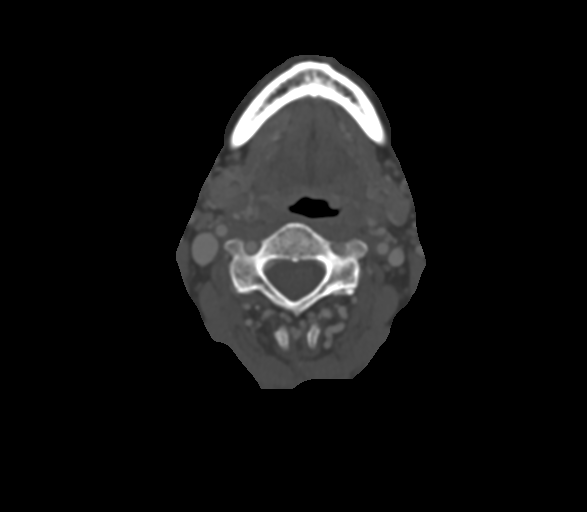

[Series 4: neck 2.00 br60 s3 bone/ no angle · axial · 0.38mm/px · z∈[-758,-662]mm · 2 of 130 slices shown]
[im 44/130  bone]
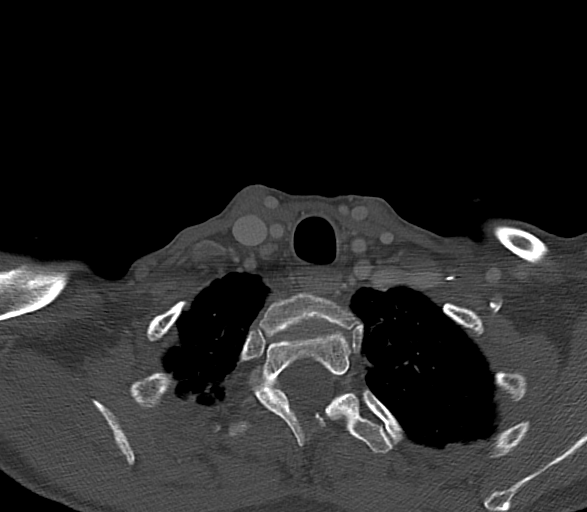
[im 87/130  bone]
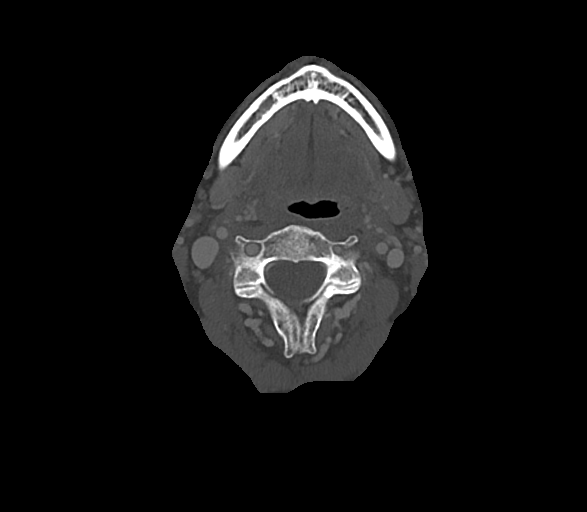

[Series 6: neck 2.00 br36 s3 angled axial (person_name) · axial · 0.38mm/px · z∈[-771,-682]mm · 2 of 135 slices shown]
[im 45/135  bone]
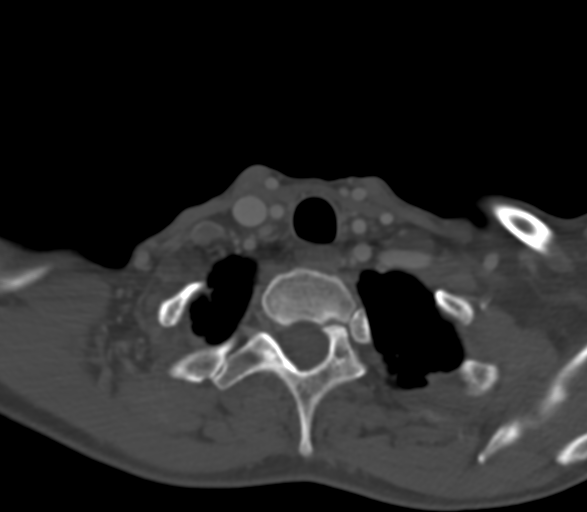
[im 90/135  bone]
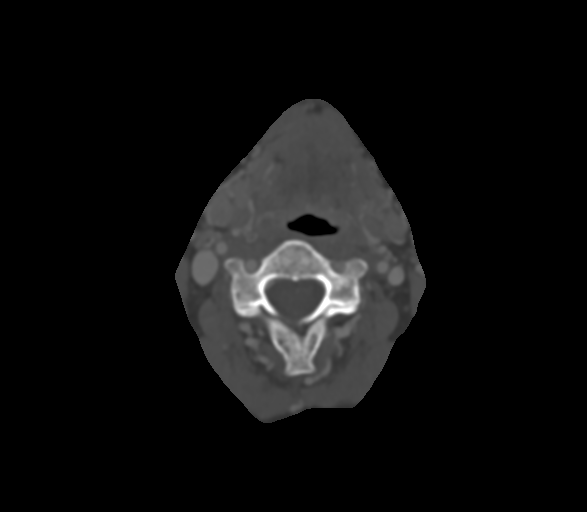

[Series 10: neck 2.00 br40 s3 (person_name) · coronal · 0.44mm/px · 3 of 98 slices shown (1 of 2)]
[im 20/98  bone]
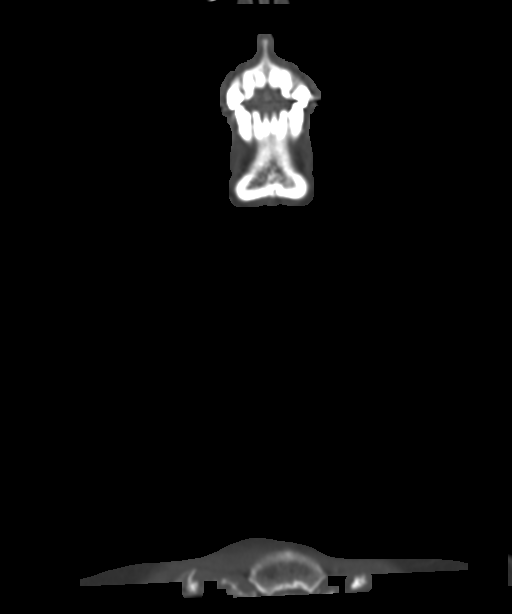
[im 39/98  bone]
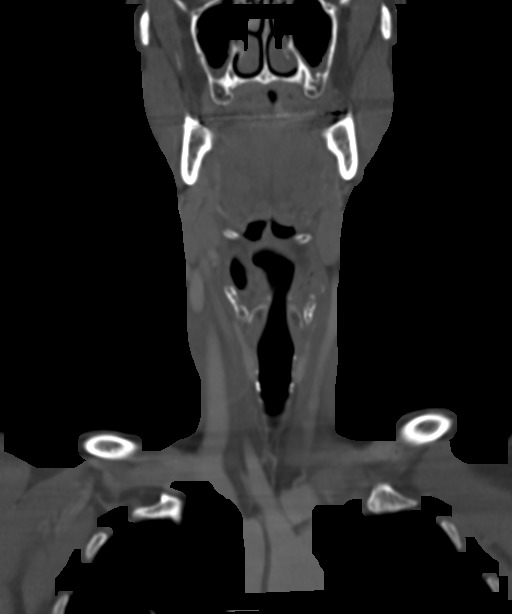
[im 59/98  bone]
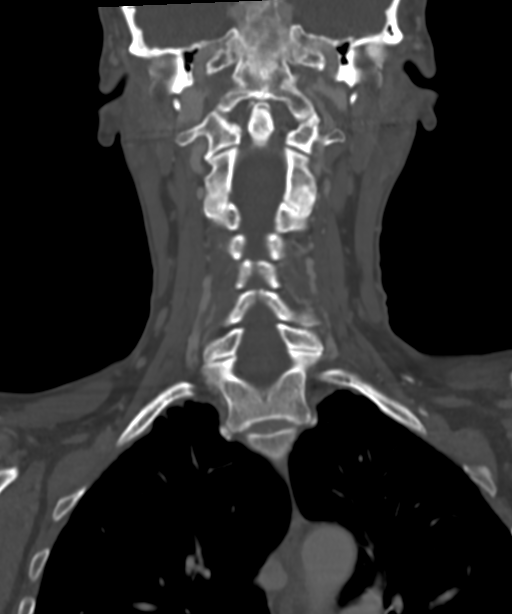

[Series 12: neck 2.00 br40 s3 (person_name) · sagittal · 0.38mm/px · 5 of 112 slices shown, 6 images (2 of 2)]
[im 38/112  bone]
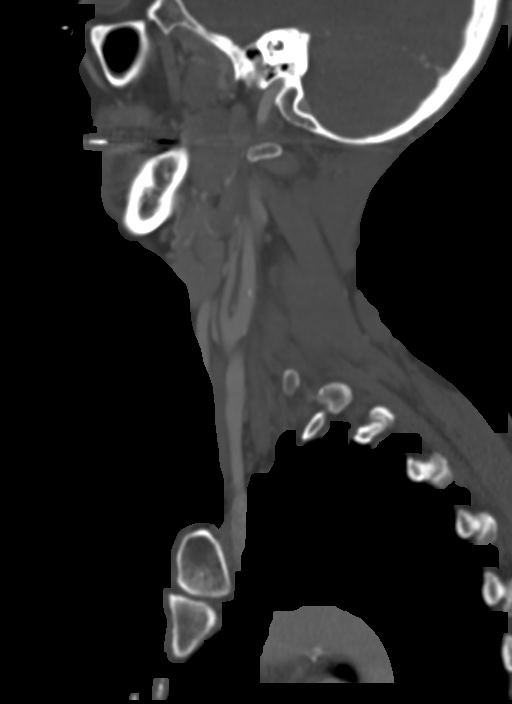
[im 47/112  bone]
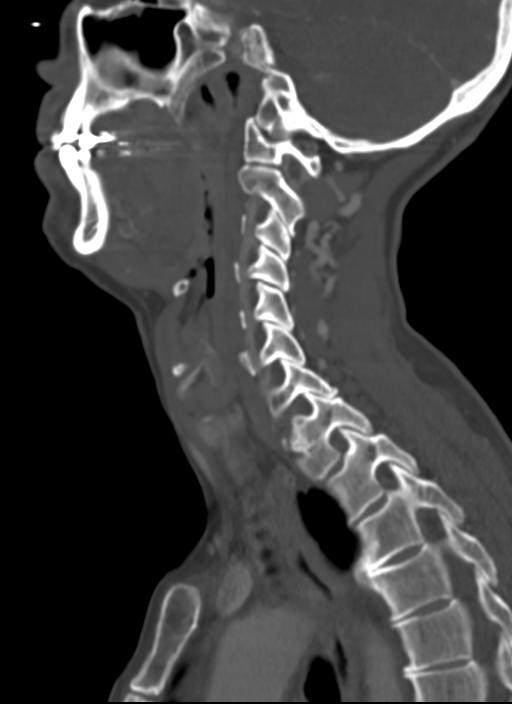
[im 56/112  soft-tissue]
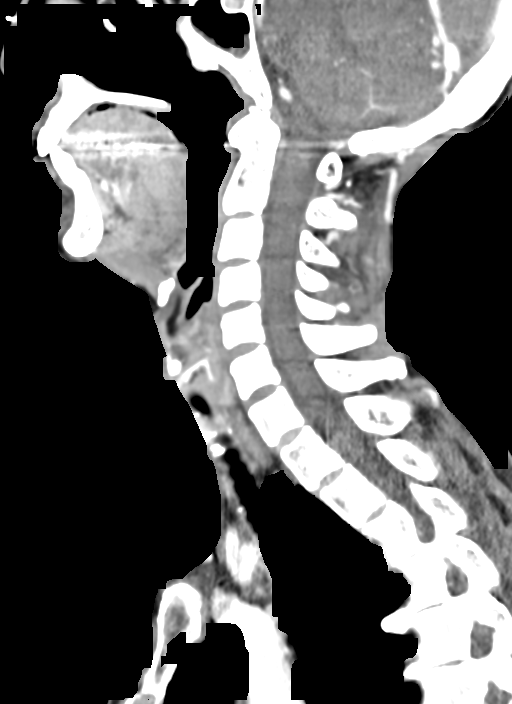
[im 56/112  bone]
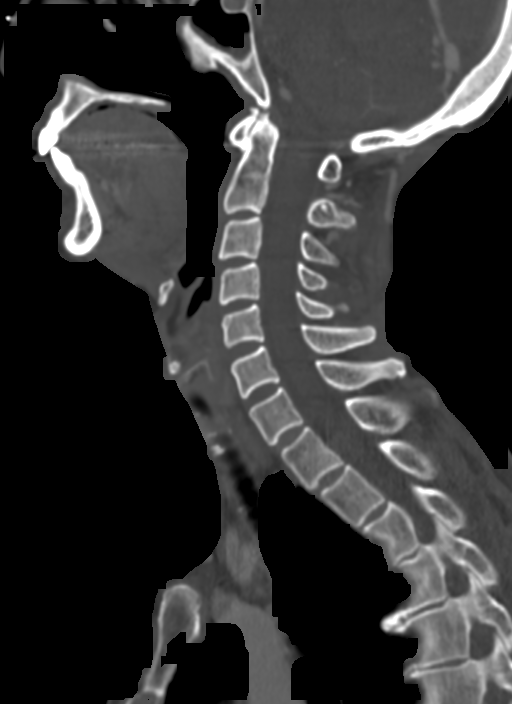
[im 65/112  bone]
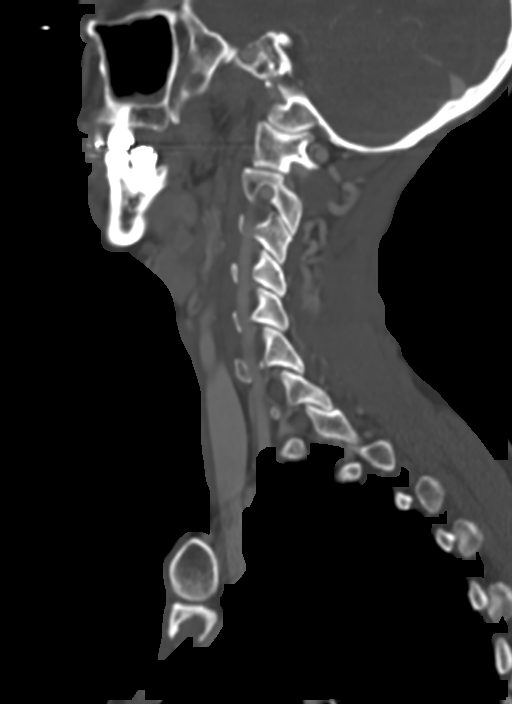
[im 75/112  bone]
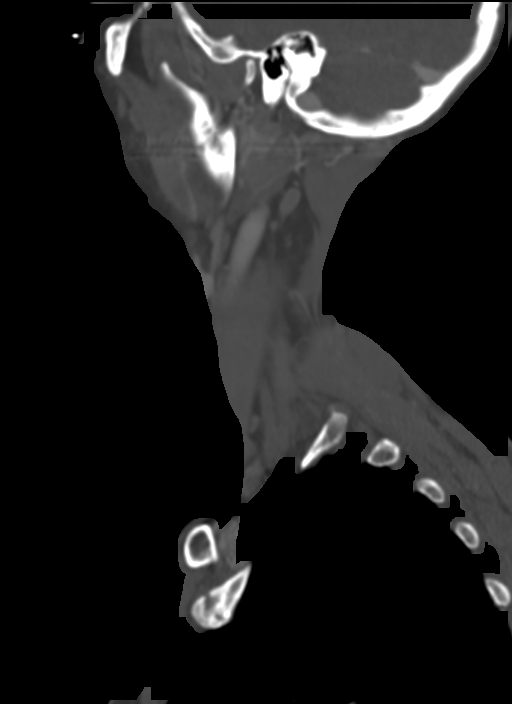

[14 of 33 positions shown; findings below may reference images not displayed]

FINDINGS: Pharynx and larynx: Normal. No mass or swelling.

Salivary glands: No inflammation, mass, or stone.

Thyroid: Normal.

Lymph nodes: None enlarged or abnormal density.

Vascular: Negative.

Limited intracranial: Negative.

Visualized orbits: Negative.

Mastoids and visualized paranasal sinuses: Minimal mucosal
thickening in the ethmoid air cells, sphenoid sinuses, and inferior
maxillary sinuses. The mastoids are unremarkable.

Skeleton: No acute or aggressive process.

Upper chest: Negative.

Other: Normal appearance of the bilateral external, middle, and
internal ears, given the limitations of this particular protocol.
IMPRESSION: 1. Normal appearance of the bilateral external, middle, and internal
ears, given the limitations of this particular protocol. If there is
persistent concern, consider a dedicated temporal bone CT or MRI of
the IAC, as clinically indicated.
2. No acute process in the neck.

## 2022-09-07 NOTE — Progress Notes (Signed)
[name] [address] [place] [address] Phone: [phone] Subjective:   Christy [name], am serving as a scribe for Dr. Antoine Primas.  I'm seeing this patient by the request  of:  Myrlene Broker, MD  CC: Right hip pain follow-up  [id number]  07/25/2022 Discussed HEP  Discussed which activities to avoid patient given different exercises that I do think would be more beneficial.  I am hopeful that actually the PRP seems to be helping.  Seems to be stronger at the moment.  We do need to increase the activity but feels that patient was doing just a little too much immediately.  Discussed which activities to do and which ones to avoid.  Follow-up again in 6 to 8 weeks we did discuss the potential need for the labral tear if she would want to talk to surgeon for surgical intervention.  Patient wants to avoid that.  Has had a lot of stress recently secondary to her husband.  He is doing much better overall.  Would recommend giving her another 6 to 8 weeks to see how she responds.  Otherwise consider also intra-articular hip injection total time reviewing patient's MRI and discussing with patient 31 minutes  Updated 09/12/2022 Christy [name] is a 67 y.o. female coming in with complaint of pain in gluteal. Patient states she is doing so much better. Patient still has trouble going up steps going down she can do but not many. Patient cannot do side stepping or side lunges. Patient would like to start working on getting those movements better.        Past Medical History:  Diagnosis Date   Acute peptic ulcer, unspecified site, with hemorrhage, without mention of obstruction    Allergy    Anxiety    Arthritis    COVID-19 09/08/2020   Disorder of bone and cartilage, unspecified    Fibromyalgia    past hx   Frozen shoulder    GERD (gastroesophageal reflux disease)    Hyperlipidemia    PT UNAWARE OF THIS FINDING   Hypertension    07/2018-  pt states no HTN since losing over 70#   Hyperthyroidism 08/2013   Internal hemorrhoids    Obstructive sleep apnea (adult) (pediatric)    Osteopenia    Other specific disorder of sleep of nonorganic origin    PONV (postoperative nausea and vomiting)    Restless legs syndrome (RLS)    Sleep apnea    07/2018 pt states surgery to correct but states she still has sleep apnea, just not as bad   Tubular adenoma of colon    Ulcerative proctitis (HCC)    Unspecified hypothyroidism    Vaginitis 2018   Vitamin D deficiency    Past Surgical History:  Procedure Laterality Date   ABDOMINAL HYSTERECTOMY     BREAST CYST ASPIRATION Right    CESAREAN SECTION     COLONOSCOPY N/A 05/25/2013   Procedure: COLONOSCOPY;  Surgeon: Hart Carwin, MD;  Location: [facility];  Service: Endoscopy;  Laterality: N/A;   COLONOSCOPY     KNEE ARTHROSCOPY Right    x 2   LAPAROSCOPY     x 4   NASAL SEPTUM SURGERY     SIGMOIDOSCOPY     TONSILLECTOMY AND ADENOIDECTOMY     UPPER GASTROINTESTINAL ENDOSCOPY     Social History   Socioeconomic History   Marital status: Married    Spouse name: Not on file   Number of  children: Not on file   Years of education: Not on file   Highest education level: Not on file  Occupational History   Occupation: Manufacturing engineer  Tobacco Use   Smoking status: Former    Current packs/day: 0.00    Average packs/day: 0.1 packs/day for 15.0 years (1.5 ttl pk-yrs)    Types: Cigarettes    Start date: 02/26/1965    Quit date: 02/27/1980    Years since quitting: 42.5   Smokeless tobacco: Never  Vaping Use   Vaping status: Never Used  Substance and Sexual Activity   Alcohol use: No   Drug use: No   Sexual activity: Not on file  Other Topics Concern   Not on file  Social History Narrative   Not on file   Social Determinants of Health   Financial Resource Strain: Not on file  Food Insecurity: Not on file  Transportation Needs: Not on file  Physical Activity: Not on file   Stress: Not on file  Social Connections: Not on file   Allergies  Allergen Reactions   Adhesive [Tape]    Bupropion    Hydrocodone-Ibuprofen    Latex Hives   Molds & Smuts    Oxycodone Itching   Tetracycline     REACTION: diarrhea   Tranxene [Clorazepate Dipotassium]     itche   Dust Mite Extract     And mold   Family History  Problem Relation Age of Onset   Kidney disease Mother    Diabetes Mother    Diabetes Father    Other Father        died from a brain bleed   Melanoma Sister    Other Sister        hx of PE's   Lung cancer Maternal Grandfather    Breast cancer Paternal Grandmother    Colon cancer Paternal Grandmother    Leukemia Paternal Grandfather    Colon cancer Other        grandmother   Breast cancer Other    Breast cancer Other        aunt   Other Daughter        PE while on birth control   Colon polyps Neg Hx    Esophageal cancer Neg Hx    Stomach cancer Neg Hx    Rectal cancer Neg Hx     Current Outpatient Medications (Endocrine & Metabolic):    SYNTHROID 150 MCG tablet, Take 150 mcg by mouth daily.   zoledronic acid (RECLAST) 5 MG/100ML SOLN injection, Inject by intravenous route.   Current Outpatient Medications (Respiratory):    loratadine (CLARITIN) 10 MG tablet, Take 10 mg by mouth.    Current Outpatient Medications (Other):    ALPRAZolam (XANAX) 1 MG tablet, Take by mouth. 1 and 1/2 tablet   cholecalciferol (VITAMIN D) 400 units TABS tablet, Take 400 Units by mouth.   ciclopirox (PENLAC) 8 % solution, Apply topically daily.   dicyclomine (BENTYL) 10 MG capsule, Take 1 tablet twice daily or three times daily as needed for abdominal cramping and abdominal pain.   fish oil-omega-3 fatty acids 1000 MG capsule, Take 2 g by mouth daily.   meclizine (ANTIVERT) 12.5 MG tablet, TAKE 1 TABLET BY MOUTH 3 TIMES DAILY AS NEEDED FOR DIZZINESS.   mesalamine (APRISO) 0.375 g 24 hr capsule, TAKE 4 CAPSULES (1.5 G TOTAL) BY MOUTH DAILY   mesalamine  (CANASA) 1000 MG suppository, Place 1 suppository (1,000 mg total) rectally at bedtime.   ondansetron (  ZOFRAN) 4 MG tablet, Take 1 tablet (4 mg total) by mouth 2 (two) times daily as needed for nausea or vomiting.   PREMARIN vaginal cream, Place 0.5 g vaginally 2 (two) times a week.   Probiotic Product (ALIGN) 4 MG CAPS, Take by mouth.   tretinoin (RETIN-A) 0.025 % cream, SMARTSIG:Sparingly Topical Every Evening   Wheat Dextrin (BENEFIBER PO), Take by mouth every morning.    Objective  Blood pressure 100/60, pulse 61, height 5\' 7"  (1.702 m), weight 138 lb (62.6 kg), SpO2 98%.   General: No apparent distress alert and oriented x3 mood and affect normal, dressed appropriately.  HEENT: Pupils equal, extraocular movements intact  Respiratory: Patient's speak in full sentences and does not appear short of breath  Cardiovascular: No lower extremity edema, non tender, no erythema   Loss of lordosis of the back, still does have tightness of the of the FABER  Non tender on exam    Impression and Recommendations:     The above documentation has been reviewed and is accurate and complete Judi Saa, DO

## 2022-09-12 ENCOUNTER — Ambulatory Visit: Payer: BC Managed Care – PPO | Admitting: Family Medicine

## 2022-09-12 ENCOUNTER — Encounter: Payer: Self-pay | Admitting: Family Medicine

## 2022-09-12 VITALS — BP 100/60 | HR 61 | Ht 67.0 in | Wt 138.0 lb

## 2022-09-12 DIAGNOSIS — M7601 Gluteal tendinitis, right hip: Secondary | ICD-10-CM

## 2022-09-12 NOTE — Assessment & Plan Note (Signed)
Patient is doing much better after the PRP.  Not having any significant pain but has not started to increase the strengthening.  Discussed protein supplementation that could also be more beneficial.  Discussed the hip abductor strengthening that I also think will be more beneficial at this point.  Patient will have 1 more follow-up in 2 months if needed.

## 2022-09-12 NOTE — Patient Instructions (Signed)
Good to see you  Proud of what you have done Whey protein isolate Goal is 80-100 grams of protein a day Start with band work with side steps 3 sets of 30 sec Forward and backward strides 3 sets of 30 sec with band  Have an appointment in 8 weeks

## 2022-10-08 ENCOUNTER — Other Ambulatory Visit: Payer: Self-pay | Admitting: Physician Assistant

## 2022-10-17 NOTE — Progress Notes (Signed)
Christy [name]    [id number]    1955-11-03  Primary Care Physician:Crawford, Austin Miles, MD  Referring Physician: Myrlene Broker, MD [address] [place],  [place] [address]   Chief complaint: Ulcerative proctitis Chief Complaint  Patient presents with   Gastroesophageal Reflux    Prilosec 20 mg OTC   Ulcerative Colitis    Using Apriso and Canasa patient is doing really well   Dysphagia    Onset months, food and pills, choking weekly, Has had her esophagus stretched in the past   coughing    At night    HPI: 67 year old female with history of chronic ulcerative proctitis. She was last seen by PA Amy Esterwood on 07-17-22 for history of colon polyps and ulcerative proctosigmoiditis. She's on on low-dose Apriso.  Today, she reports that her UC is stable and doing well. She would occasionally experience flare ups every now and then. She is currently taking Apriso .375 mg 4x daily. She has also been using Dicyclomine 10 mg twice daily. She states that she is no longer having sever bleeding but would occasionally notice a streak of blood. We also reviewed her latest complain of diarrhea and states that she was taking OTC Magnesium for leg cramps and believes that could have triggered her diarrhea. She states that she was taking Canasa suppositories when needed.  She does complain of dysphagia with foods and pills. She tends to experience her symptoms when eating certain foods such as apples. She states that she would feel the food being stuck in her throat but it would eventually pass. Her reflux symptoms are worsened when she is laying down. She reports experiencing accompanying coughing. She is currently taking Omeprazole 20 mg daily. She reports taking GasX and Mylenta twice daily which have greatly improved her symptoms.  She states that she only takes Tylenol to relief her symptoms and avoids other NSAID's.  She has been experiencing stress related to her  husband's health.   Patient denies any constipation, nausea, blood in stool, black stool, vomiting, abdominal pain, bloating, unintentional weight loss, reflux.   GI Hx: Colonoscopy 09-22-21  - Inflammation was not found on today's exam (based on the endoscopic appearance of the mucosa). This was graded as Mayo Score 0 (normal or inactive disease), quiescent compared to previous examinations.  - The examination was otherwise normal.  - No specimens collected.  Colonoscopy 08-22-18 - Four 1 to 2 mm polyps in the rectum and in the cecum, removed with a cold biopsy forceps. Resected and retrieved.  - One 5 mm polyp in the transverse colon, removed with a cold snare. Resected and retrieved.  - Non-bleeding internal hemorrhoids.  - Normal mucosa in the entire examined colon. 1. Surgical [P], transverse and cecum, polyps (2) - HYPERPLASTIC POLYP(S). - NO ADENOMATOUS CHANGE OR MALIGNANCY. 2. Surgical [P], rectum, polyps (3) - HYPERPLASTIC POLYP(S). - NO ADENOMATOUS CHANGE OR MALIGNANCY.  Flexible sigmoidoscopy May 04, 2016 proctitis otherwise normal exam.  Biopsy showed moderately active colitis, negative for CMV.  Colonoscopy May 25, 2013 by Dr. Juanda Chance showed normal colon, random biopsies were obtained, showed benign mucosa with no evidence of inflammatory bowel disease.  Recall colonoscopy in 5 years    Current Outpatient Medications:    ALPRAZolam (XANAX) 1 MG tablet, Take by mouth. 1 and 1/2 tablet, Disp: , Rfl:    cholecalciferol (VITAMIN D) 400 units TABS tablet, Take 400 Units by mouth., Disp: , Rfl:  fish oil-omega-3 fatty acids 1000 MG capsule, Take 2 g by mouth daily., Disp: , Rfl:    loratadine (CLARITIN) 10 MG tablet, Take 10 mg by mouth., Disp: , Rfl:    meclizine (ANTIVERT) 12.5 MG tablet, TAKE 1 TABLET BY MOUTH 3 TIMES DAILY AS NEEDED FOR DIZZINESS., Disp: 30 tablet, Rfl: 1   mesalamine (APRISO) 0.375 g 24 hr capsule, TAKE 4 CAPSULES (1.5 G TOTAL) BY MOUTH DAILY, Disp:  360 capsule, Rfl: 4   omeprazole (PRILOSEC OTC) 20 MG tablet, Take 20 mg by mouth daily., Disp: , Rfl:    ondansetron (ZOFRAN) 4 MG tablet, Take 1 tablet (4 mg total) by mouth 2 (two) times daily as needed for nausea or vomiting., Disp: 60 tablet, Rfl: 2   PREMARIN vaginal cream, Place 0.5 g vaginally 2 (two) times a week., Disp: , Rfl:    Probiotic Product (ALIGN) 4 MG CAPS, Take by mouth., Disp: , Rfl:    SYNTHROID 150 MCG tablet, Take 150 mcg by mouth daily., Disp: , Rfl: 9   tretinoin (RETIN-A) 0.025 % cream, SMARTSIG:Sparingly Topical Every Evening, Disp: , Rfl:    Wheat Dextrin (BENEFIBER PO), Take by mouth every morning., Disp: , Rfl:    dicyclomine (BENTYL) 10 MG capsule, Take 1 tablet twice daily or three times daily as needed for abdominal cramping and abdominal pain. (Patient not taking: Reported on 10/22/2022), Disp: 90 capsule, Rfl: 3   mesalamine (CANASA) 1000 MG suppository, PLACE 1 SUPPOSITORY (1,000 MG TOTAL) RECTALLY AT BEDTIME. (Patient not taking: Reported on 10/22/2022), Disp: 30 suppository, Rfl: 2   zoledronic acid (RECLAST) 5 MG/100ML SOLN injection, Inject by intravenous route. (Patient not taking: Reported on 10/22/2022), Disp: , Rfl:     Allergies as of 10/22/2022 - Review Complete 10/22/2022  Allergen Reaction Noted   Adhesive [tape]  05/20/2013   Bupropion  09/01/2021   Hydrocodone-ibuprofen  09/01/2021   Latex Hives 05/20/2013   Molds & smuts  09/01/2021   Oxycodone Itching 05/09/2012   Tetracycline     Tranxene [clorazepate dipotassium]  09/06/2010   Dust mite extract  01/30/2018    Past Medical History:  Diagnosis Date   Acute peptic ulcer, unspecified site, with hemorrhage, without mention of obstruction    Allergy    Anxiety    Arthritis    COVID-19 09/08/2020   Disorder of bone and cartilage, unspecified    Fibromyalgia    past hx   Frozen shoulder    GERD (gastroesophageal reflux disease)    Hyperlipidemia    PT UNAWARE OF THIS FINDING    Hypertension    07/2018- pt states no HTN since losing over 70#   Hyperthyroidism 08/2013   Internal hemorrhoids    Obstructive sleep apnea (adult) (pediatric)    Osteopenia    Other specific disorder of sleep of nonorganic origin    PONV (postoperative nausea and vomiting)    Restless legs syndrome (RLS)    Sleep apnea    07/2018 pt states surgery to correct but states she still has sleep apnea, just not as bad   Tubular adenoma of colon    Ulcerative proctitis (HCC)    Unspecified hypothyroidism    Vaginitis 2018   Vitamin D deficiency     Past Surgical History:  Procedure Laterality Date   ABDOMINAL HYSTERECTOMY     BREAST CYST ASPIRATION Right    CESAREAN SECTION     COLONOSCOPY N/A 05/25/2013   Procedure: COLONOSCOPY;  Surgeon: Hart Carwin, MD;  Location:  [facility];  Service: Endoscopy;  Laterality: N/A;   COLONOSCOPY     KNEE ARTHROSCOPY Right    x 2   LAPAROSCOPY     x 4   NASAL SEPTUM SURGERY     SIGMOIDOSCOPY     TONSILLECTOMY AND ADENOIDECTOMY     UPPER GASTROINTESTINAL ENDOSCOPY      Family History  Problem Relation Age of Onset   Kidney disease Mother    Diabetes Mother    Diabetes Father    Other Father        died from a brain bleed   Melanoma Sister    Other Sister        hx of PE's   Lung cancer Maternal Grandfather    Breast cancer Paternal Grandmother    Colon cancer Paternal Grandmother    Leukemia Paternal Grandfather    Colon cancer Other        grandmother   Breast cancer Other    Breast cancer Other        aunt   Other Daughter        PE while on birth control   Colon polyps Neg Hx    Esophageal cancer Neg Hx    Stomach cancer Neg Hx    Rectal cancer Neg Hx     Social History   Socioeconomic History   Marital status: Married    Spouse name: Not on file   Number of children: Not on file   Years of education: Not on file   Highest education level: Not on file  Occupational History   Occupation: Manufacturing engineer  Tobacco  Use   Smoking status: Former    Current packs/day: 0.00    Average packs/day: 0.1 packs/day for 15.0 years (1.5 ttl pk-yrs)    Types: Cigarettes    Start date: 02/26/1965    Quit date: 02/27/1980    Years since quitting: 42.6   Smokeless tobacco: Never  Vaping Use   Vaping status: Never Used  Substance and Sexual Activity   Alcohol use: No   Drug use: No   Sexual activity: Not on file  Other Topics Concern   Not on file  Social History Narrative   Not on file   Social Determinants of Health   Financial Resource Strain: Not on file  Food Insecurity: Not on file  Transportation Needs: Not on file  Physical Activity: Not on file  Stress: Not on file  Social Connections: Not on file  Intimate Partner Violence: Not on file     Review of systems: Review of Systems  Constitutional:  Negative for unexpected weight change.  HENT:  Positive for trouble swallowing.   Gastrointestinal:  Positive for blood in stool, constipation and diarrhea. Negative for abdominal distention, abdominal pain, anal bleeding, nausea, rectal pain and vomiting.    Physical Exam: General: well-appearing   Eyes: sclera anicteric, no redness ENT: oral mucosa moist without lesions, no cervical or supraclavicular lymphadenopathy CV: RRR, no JVD, no peripheral edema Resp: clear to auscultation bilaterally, normal RR and effort noted GI: soft, no tenderness, with active bowel sounds. No guarding or palpable organomegaly noted. Skin; warm and dry, no rash or jaundice noted Neuro: awake, alert and oriented x 3. Normal gross motor function and fluent speech   Data Reviewed:  Reviewed labs, radiology imaging, old records and pertinent past GI work up   Assessment and Plan/Recommendations:  67 year old female with ulcerative proctitis, and clinical remission on mesalamine Continue mesalamine, will decrease to  Apriso 2 capsules daily.   Continue Benefiber 1 teaspoon 2-3 times daily Follow-up CRP, CMP and  fecal calprotectin  Intermittent abdominal discomfort and cramping: Use dicyclomine 10 mg every 8 hours as needed  Dysphagia to pills and solids: Will plan to proceed with EGD for evaluation, esophageal biopsy evaluation as needed History of GERD: Use omeprazole 20 mg once daily, add Pepcid 20 mg at bedtime Antireflux measures  The risks and benefits as well as alternatives of endoscopic procedure(s) have been discussed and reviewed. All questions answered. The patient agrees to proceed.   Iona Beard , MD   CC: Myrlene Broker, *   [place] M Kadhim,acting as a scribe for Marsa Aris, MD.,have documented all relevant documentation on the behalf of Marsa Aris, MD,as directed by  Marsa Aris, MD while in the presence of Marsa Aris, MD.   I, Marsa Aris, MD, have reviewed all documentation for this visit. The documentation on 10/22/22 for the exam, diagnosis, procedures, and orders are all accurate and complete.

## 2022-10-22 ENCOUNTER — Other Ambulatory Visit (INDEPENDENT_AMBULATORY_CARE_PROVIDER_SITE_OTHER): Payer: BC Managed Care – PPO

## 2022-10-22 ENCOUNTER — Ambulatory Visit (INDEPENDENT_AMBULATORY_CARE_PROVIDER_SITE_OTHER): Payer: BC Managed Care – PPO | Admitting: Gastroenterology

## 2022-10-22 ENCOUNTER — Encounter: Payer: Self-pay | Admitting: Gastroenterology

## 2022-10-22 VITALS — BP 124/62 | HR 66 | Ht 67.5 in | Wt 137.0 lb

## 2022-10-22 DIAGNOSIS — K5120 Ulcerative (chronic) proctitis without complications: Secondary | ICD-10-CM

## 2022-10-22 DIAGNOSIS — R1310 Dysphagia, unspecified: Secondary | ICD-10-CM | POA: Diagnosis not present

## 2022-10-22 DIAGNOSIS — K219 Gastro-esophageal reflux disease without esophagitis: Secondary | ICD-10-CM | POA: Diagnosis not present

## 2022-10-22 LAB — COMPREHENSIVE METABOLIC PANEL
ALT: 15 U/L (ref 0–35)
AST: 19 U/L (ref 0–37)
Albumin: 4.6 g/dL (ref 3.5–5.2)
Alkaline Phosphatase: 52 U/L (ref 39–117)
BUN: 20 mg/dL (ref 6–23)
CO2: 29 mEq/L (ref 19–32)
Calcium: 10.1 mg/dL (ref 8.4–10.5)
Chloride: 100 mEq/L (ref 96–112)
Creatinine, Ser: 0.98 mg/dL (ref 0.40–1.20)
GFR: 60.45 mL/min (ref >60.00)
Glucose, Bld: 99 mg/dL (ref 70–99)
Potassium: 4.1 mEq/L (ref 3.5–5.1)
Sodium: 139 mEq/L (ref 135–145)
Total Bilirubin: 0.5 mg/dL (ref 0.2–1.2)
Total Protein: 7.9 g/dL (ref 6.0–8.3)

## 2022-10-22 LAB — HIGH SENSITIVITY CRP: CRP, High Sensitivity: 2.13 mg/L (ref 0.000–5.000)

## 2022-10-22 MED ORDER — FAMOTIDINE 20 MG PO TABS: 20.0000 mg | ORAL_TABLET | Freq: Every day | ORAL | 3 refills | Status: DC

## 2022-10-22 MED ORDER — OMEPRAZOLE MAGNESIUM 20 MG PO TBEC: 20.0000 mg | DELAYED_RELEASE_TABLET | Freq: Every day | ORAL | 3 refills | Status: DC

## 2022-10-22 MED ORDER — MESALAMINE ER 0.375 G PO CP24: 750.0000 mg | ORAL_CAPSULE | Freq: Every day | ORAL | 3 refills | Status: DC

## 2022-10-22 NOTE — Patient Instructions (Addendum)
Your provider has requested that you go to the basement level for lab work before leaving today. Press "B" on the elevator. The lab is located at the first door on the left as you exit the elevator.   You have been scheduled for a Endoscopy. Please follow written instructions given to you at your visit today.   If you use inhalers (even only as needed), please bring them with you on the day of your procedure.  DO NOT TAKE 7 DAYS PRIOR TO TEST- Trulicity (dulaglutide) Ozempic, Wegovy (semaglutide) Mounjaro (tirzepatide) Bydureon Bcise (exanatide extended release)  DO NOT TAKE 1 DAY PRIOR TO YOUR TEST Rybelsus (semaglutide) Adlyxin (lixisenatide) Victoza (liraglutide) Byetta (exanatide) ___________________________________________________________________________   We have sent the following medications to your pharmacy for you to pick up at your convenience:  Apriso Prilosec  Pepcid  Gastroesophageal Reflux Disease, Adult Gastroesophageal reflux (GER) happens when acid from the stomach flows up into the tube that connects the mouth and the stomach (esophagus). Normally, food travels down the esophagus and stays in the stomach to be digested. However, when a person has GER, food and stomach acid sometimes move back up into the esophagus. If this becomes a more serious problem, the person may be diagnosed with a disease called gastroesophageal reflux disease (GERD). GERD occurs when the reflux: Happens often. Causes frequent or severe symptoms. Causes problems such as damage to the esophagus. When stomach acid comes in contact with the esophagus, the acid may cause inflammation in the esophagus. Over time, GERD may create small holes (ulcers) in the lining of the esophagus. What are the causes? This condition is caused by a problem with the muscle between the esophagus and the stomach (lower esophageal sphincter, or LES). Normally, the LES muscle closes after food passes through the esophagus  to the stomach. When the LES is weakened or abnormal, it does not close properly, and that allows food and stomach acid to go back up into the esophagus. The LES can be weakened by certain dietary substances, medicines, and medical conditions, including: Tobacco use. Pregnancy. Having a hiatal hernia. Alcohol use. Certain foods and beverages, such as coffee, chocolate, onions, and peppermint. What increases the risk? You are more likely to develop this condition if you: Have an increased body weight. Have a connective tissue disorder. Take NSAIDs, such as ibuprofen. What are the signs or symptoms? Symptoms of this condition include: Heartburn. Difficult or painful swallowing and the feeling of having a lump in the throat. A bitter taste in the mouth. Bad breath and having a large amount of saliva. Having an upset or bloated stomach and belching. Chest pain. Different conditions can cause chest pain. Make sure you see your health care provider if you experience chest pain. Shortness of breath or wheezing. Ongoing (chronic) cough or a nighttime cough. Wearing away of tooth enamel. Weight loss. How is this diagnosed? This condition may be diagnosed based on a medical history and a physical exam. To determine if you have mild or severe GERD, your health care provider may also monitor how you respond to treatment. You may also have tests, including: A test to examine your stomach and esophagus with a small camera (endoscopy). A test that measures the acidity level in your esophagus. A test that measures how much pressure is on your esophagus. A barium swallow or modified barium swallow test to show the shape, size, and functioning of your esophagus. How is this treated? Treatment for this condition may vary depending on how  severe your symptoms are. Your health care provider may recommend: Changes to your diet. Medicine. Surgery. The goal of treatment is to help relieve your symptoms  and to prevent complications. Follow these instructions at home: Eating and drinking  Follow a diet as recommended by your health care provider. This may involve avoiding foods and drinks such as: Coffee and tea, with or without caffeine. Drinks that contain alcohol. Energy drinks and sports drinks. Carbonated drinks or sodas. Chocolate and cocoa. Peppermint and mint flavorings. Garlic and onions. Horseradish. Spicy and acidic foods, including peppers, chili powder, curry powder, vinegar, hot sauces, and barbecue sauce. Citrus fruit juices and citrus fruits, such as oranges, lemons, and limes. Tomato-based foods, such as red sauce, chili, salsa, and pizza with red sauce. Fried and fatty foods, such as donuts, french fries, potato chips, and high-fat dressings. High-fat meats, such as hot dogs and fatty cuts of red and white meats, such as rib eye steak, sausage, ham, and bacon. High-fat dairy items, such as whole milk, butter, and cream cheese. Eat small, frequent meals instead of large meals. Avoid drinking large amounts of liquid with your meals. Avoid eating meals during the 2-3 hours before bedtime. Avoid lying down right after you eat. Do not exercise right after you eat. Lifestyle  Do not use any products that contain nicotine or tobacco. These products include cigarettes, chewing tobacco, and vaping devices, such as e-cigarettes. If you need help quitting, ask your health care provider. Try to reduce your stress by using methods such as yoga or meditation. If you need help reducing stress, ask your health care provider. If you are overweight, reduce your weight to an amount that is healthy for you. Ask your health care provider for guidance about a safe weight loss goal. General instructions Pay attention to any changes in your symptoms. Take over-the-counter and prescription medicines only as told by your health care provider. Do not take aspirin, ibuprofen, or other NSAIDs  unless your health care provider told you to take these medicines. Wear loose-fitting clothing. Do not wear anything tight around your waist that causes pressure on your abdomen. Raise (elevate) the head of your bed about 6 inches (15 cm). You can use a wedge to do this. Avoid bending over if this makes your symptoms worse. Keep all follow-up visits. This is important. Contact a health care provider if: You have: New symptoms. Unexplained weight loss. Difficulty swallowing or it hurts to swallow. Wheezing or a persistent cough. A hoarse voice. Your symptoms do not improve with treatment. Get help right away if: You have sudden pain in your arms, neck, jaw, teeth, or back. You suddenly feel sweaty, dizzy, or light-headed. You have chest pain or shortness of breath. You vomit and the vomit is green, yellow, or black, or it looks like blood or coffee grounds. You faint. You have stool that is red, bloody, or black. You cannot swallow, drink, or eat. These symptoms may represent a serious problem that is an emergency. Do not wait to see if the symptoms will go away. Get medical help right away. Call your local emergency services (911 in the [place] Do not drive yourself to the hospital. Summary Gastroesophageal reflux happens when acid from the stomach flows up into the esophagus. GERD is a disease in which the reflux happens often, causes frequent or severe symptoms, or causes problems such as damage to the esophagus. Treatment for this condition may vary depending on how severe your symptoms are. Your health care  provider may recommend diet and lifestyle changes, medicine, or surgery. Contact a health care provider if you have new or worsening symptoms. Take over-the-counter and prescription medicines only as told by your health care provider. Do not take aspirin, ibuprofen, or other NSAIDs unless your health care provider told you to do so. Keep all follow-up visits as told by your health  care provider. This is important. This information is not intended to replace advice given to you by your health care provider. Make sure you discuss any questions you have with your health care provider. Document Revised: 08/22/2019 Document Reviewed: 08/24/2019 Elsevier Patient Education  2024 [employer]   Follow up in 6 months  Due to recent changes in healthcare laws, you may see the results of your imaging and laboratory studies on MyChart before your provider has had a chance to review them.  We understand that in some cases there may be results that are confusing or concerning to you. Not all laboratory results come back in the same time frame and the provider may be waiting for multiple results in order to interpret others.  Please give [place] 48 hours in order for your provider to thoroughly review all the results before contacting the office for clarification of your results.    I appreciate the  opportunity to care for you  Thank You   Marsa Aris , MD

## 2022-10-23 ENCOUNTER — Ambulatory Visit: Payer: BC Managed Care – PPO

## 2022-10-23 DIAGNOSIS — R1310 Dysphagia, unspecified: Secondary | ICD-10-CM

## 2022-10-23 DIAGNOSIS — K5120 Ulcerative (chronic) proctitis without complications: Secondary | ICD-10-CM

## 2022-10-23 DIAGNOSIS — K219 Gastro-esophageal reflux disease without esophagitis: Secondary | ICD-10-CM

## 2022-10-24 ENCOUNTER — Encounter: Payer: Self-pay | Admitting: Gastroenterology

## 2022-10-24 ENCOUNTER — Ambulatory Visit (AMBULATORY_SURGERY_CENTER): Payer: BC Managed Care – PPO | Admitting: Gastroenterology

## 2022-10-24 VITALS — BP 112/51 | HR 62 | Temp 98.6°F | Resp 12 | Ht 67.0 in | Wt 137.0 lb

## 2022-10-24 DIAGNOSIS — R1310 Dysphagia, unspecified: Secondary | ICD-10-CM

## 2022-10-24 DIAGNOSIS — K219 Gastro-esophageal reflux disease without esophagitis: Secondary | ICD-10-CM

## 2022-10-24 MED ORDER — SODIUM CHLORIDE 0.9 % IV SOLN: 500.0000 mL | INTRAVENOUS | Status: DC

## 2022-10-24 NOTE — Patient Instructions (Signed)
   Handout on GERD ( anti reflux regimen ) given to you today  May resume usual diet & medications    YOU HAD AN ENDOSCOPIC PROCEDURE TODAY AT THE [facility]:   Refer to the procedure report that was given to you for any specific questions about what was found during the examination.  If the procedure report does not answer your questions, please call your gastroenterologist to clarify.  If you requested that your care partner not be given the details of your procedure findings, then the procedure report has been included in a sealed envelope for you to review at your convenience later.  YOU SHOULD EXPECT: Some feelings of bloating in the abdomen. Passage of more gas than usual.  Walking can help get rid of the air that was put into your GI tract during the procedure and reduce the bloating. If you had a lower endoscopy (such as a colonoscopy or flexible sigmoidoscopy) you may notice spotting of blood in your stool or on the toilet paper. If you underwent a bowel prep for your procedure, you may not have a normal bowel movement for a few days.  Please Note:  You might notice some irritation and congestion in your nose or some drainage.  This is from the oxygen used during your procedure.  There is no need for concern and it should clear up in a day or so.  SYMPTOMS TO REPORT IMMEDIATELY:  Following upper endoscopy (EGD)  Vomiting of blood or coffee ground material  New chest pain or pain under the shoulder blades  Painful or persistently difficult swallowing  New shortness of breath  Fever of 100F or higher  Black, tarry-looking stools  For urgent or emergent issues, a gastroenterologist can be reached at any hour by calling [phone]. Do not use MyChart messaging for urgent concerns.    DIET:  We do recommend a small meal at first, but then you may proceed to your regular diet.  Drink plenty of fluids but you should avoid alcoholic beverages for 24 hours.  ACTIVITY:   You should plan to take it easy for the rest of today and you should NOT DRIVE or use heavy machinery until tomorrow (because of the sedation medicines used during the test).    FOLLOW UP: Our staff will call the number listed on your records the next business day following your procedure.  We will call around 7:15- 8:00 am to check on you and address any questions or concerns that you may have regarding the information given to you following your procedure. If we do not reach you, we will leave a message.     If any biopsies were taken you will be contacted by phone or by letter within the next 1-3 weeks.  Please call us at [phone] if you have not heard about the biopsies in 3 weeks.    SIGNATURES/CONFIDENTIALITY: You and/or your care partner have signed paperwork which will be entered into your electronic medical record.  These signatures attest to the fact that that the information above on your After Visit Summary has been reviewed and is understood.  Full responsibility of the confidentiality of this discharge information lies with you and/or your care-partner.

## 2022-10-24 NOTE — Progress Notes (Unsigned)
Vss nad trans to pacu 

## 2022-10-24 NOTE — Progress Notes (Unsigned)
Please refer to office visit note 10/22/22. No additional changes in H&P Patient is appropriate for planned procedure(s) and anesthesia in an ambulatory setting  K. Scherry Ran , MD [phone]

## 2022-10-24 NOTE — Op Note (Signed)
[facility] Patient Name: Christy [name] Procedure Date: 10/24/2022 3:39 PM MRN: [id number] Endoscopist: Napoleon Form , MD, [id number] Age: 67 Referring MD:  Date of Birth: 08-03-1955 Gender: Female Account #: [id number] Procedure:                Upper GI endoscopy Indications:              Dysphagia Medicines:                Monitored Anesthesia Care Procedure:                Pre-Anesthesia Assessment:                           - Prior to the procedure, a History and Physical                            was performed, and patient medications and                            allergies were reviewed. The patient's tolerance of                            previous anesthesia was also reviewed. The risks                            and benefits of the procedure and the sedation                            options and risks were discussed with the patient.                            All questions were answered, and informed consent                            was obtained. Prior Anticoagulants: The patient has                            taken no anticoagulant or antiplatelet agents. ASA                            Grade Assessment: III - A patient with severe                            systemic disease. After reviewing the risks and                            benefits, the patient was deemed in satisfactory                            condition to undergo the procedure.                           After obtaining informed consent, the endoscope was  passed under direct vision. Throughout the                            procedure, the patient's blood pressure, pulse, and                            oxygen saturations were monitored continuously. The                            Olympus Scope SN [id number] was introduced through the                            mouth, and advanced to the second part of duodenum.                            The upper GI endoscopy was  accomplished without                            difficulty. The patient tolerated the procedure                            well. Scope In: Scope Out: Findings:                 The Z-line was regular and was found 38 cm from the                            incisors.                           No endoscopic abnormality was evident in the                            esophagus to explain the patient's complaint of                            dysphagia. It was decided, however, to proceed with                            dilation of the lower third of the esophagus. A TTS                            dilator was passed through the scope. Dilation with                            an 18-19-20 mm x 8 cm CRE balloon dilator was                            performed to 20 mm. The dilation site was examined                            following endoscope reinsertion and showed no  change.                           The stomach was normal.                           The cardia and gastric fundus were normal on                            retroflexion.                           The examined duodenum was normal. Complications:            No immediate complications. Estimated Blood Loss:     Estimated blood loss was minimal. Impression:               - Z-line regular, 38 cm from the incisors.                           - No endoscopic esophageal abnormality to explain                            patient's dysphagia. Esophagus dilated. Dilated.                           - Normal stomach.                           - Normal examined duodenum.                           - No specimens collected. Recommendation:           - Patient has a contact number available for                            emergencies. The signs and symptoms of potential                            delayed complications were discussed with the                            patient. Return to normal activities tomorrow.                             Written discharge instructions were provided to the                            patient.                           - Resume previous diet.                           - Continue present medications.                           - Follow an antireflux  regimen. Napoleon Form, MD 10/24/2022 4:02:32 PM This report has been signed electronically.

## 2022-10-24 NOTE — Progress Notes (Signed)
Called to room to assist during endoscopic procedure.  Patient ID and intended procedure confirmed with present staff. Received instructions for my participation in the procedure from the performing physician.  

## 2022-10-24 NOTE — Progress Notes (Signed)
No need for post dilatation diet today per Dr Lavon Paganini

## 2022-10-25 ENCOUNTER — Telehealth: Payer: Self-pay | Admitting: *Deleted

## 2022-10-25 NOTE — Telephone Encounter (Signed)
  Follow up Call-     10/24/2022    2:42 PM 09/22/2021    2:29 PM  Call back number  Post procedure Call Back phone  # [phone]  Permission to leave phone message Yes Yes     Patient questions:  Do you have a fever, pain , or abdominal swelling? No. Pain Score  0 *  Have you tolerated food without any problems? Yes.    Have you been able to return to your normal activities? Yes.    Do you have any questions about your discharge instructions: Diet   No. Medications  No. Follow up visit  No.  Do you have questions or concerns about your Care? No.  Actions: * If pain score is 4 or above: No action needed, pain <4.

## 2022-10-26 LAB — CALPROTECTIN, FECAL: Calprotectin, Fecal: 49 ug/g (ref 0–120)

## 2022-11-07 ENCOUNTER — Ambulatory Visit: Payer: BC Managed Care – PPO | Admitting: Family Medicine

## 2022-11-23 NOTE — Progress Notes (Unsigned)
[name] [address] [place] [address] Phone: [phone] Subjective:   Christy [name], am serving as a scribe for Dr. Antoine Primas.  I'm seeing this patient by the request  of:  Myrlene Broker, MD  CC: right hip   [id number]  09/12/2022 Patient is doing much better after the PRP.  Not having any significant pain but has not started to increase the strengthening.  Discussed protein supplementation that could also be more beneficial.  Discussed the hip abductor strengthening that I also think will be more beneficial at this point.  Patient will have 1 more follow-up in 2 months if needed.     Updated 11/28/2022 Christy [name] is a 67 y.o. female coming in with complaint of gluteal pain. Everything is uncomfortable. Hip and gluteal hurt at night as well while laying down. Steps are still hard to do. Has flared up since beach trip in August. Pain hasn't gotten worse, but not better. Tried lidocaine patches and tylenol. Has been doing exercises and has adhered to all recommendation.      Past Medical History:  Diagnosis Date   Acute peptic ulcer, unspecified site, with hemorrhage, without mention of obstruction    Allergy    Anxiety    Arthritis    COVID-19 09/08/2020   Disorder of bone and cartilage, unspecified    Fibromyalgia    past hx   Frozen shoulder    GERD (gastroesophageal reflux disease)    Hyperlipidemia    PT UNAWARE OF THIS FINDING   Hypertension    07/2018- pt states no HTN since losing over 70#   Hyperthyroidism 08/2013   Internal hemorrhoids    Obstructive sleep apnea (adult) (pediatric)    Osteopenia    Other specific disorder of sleep of nonorganic origin    PONV (postoperative nausea and vomiting)    Restless legs syndrome (RLS)    Sleep apnea    07/2018 pt states surgery to correct but states she still has sleep apnea, just not as bad   Tubular adenoma of colon    Ulcerative proctitis (HCC)     Unspecified hypothyroidism    Vaginitis 2018   Vitamin D deficiency    Past Surgical History:  Procedure Laterality Date   ABDOMINAL HYSTERECTOMY     BREAST CYST ASPIRATION Right    CESAREAN SECTION     COLONOSCOPY N/A 05/25/2013   Procedure: COLONOSCOPY;  Surgeon: Hart Carwin, MD;  Location: [facility];  Service: Endoscopy;  Laterality: N/A;   COLONOSCOPY     KNEE ARTHROSCOPY Right    x 2   LAPAROSCOPY     x 4   NASAL SEPTUM SURGERY     SIGMOIDOSCOPY     TONSILLECTOMY AND ADENOIDECTOMY     UPPER GASTROINTESTINAL ENDOSCOPY     Social History   Socioeconomic History   Marital status: Married    Spouse name: Not on file   Number of children: Not on file   Years of education: Not on file   Highest education level: Not on file  Occupational History   Occupation: Personal assistant  Tobacco Use   Smoking status: Former    Current packs/day: 0.00    Average packs/day: 0.1 packs/day for 15.0 years (1.5 ttl pk-yrs)    Types: Cigarettes    Start date: 02/26/1965    Quit date: 02/27/1980    Years since quitting: 42.7   Smokeless tobacco: Never  Vaping Use   Vaping  status: Never Used  Substance and Sexual Activity   Alcohol use: No   Drug use: No   Sexual activity: Not on file  Other Topics Concern   Not on file  Social History Narrative   Not on file   Social Determinants of Health   Financial Resource Strain: Not on file  Food Insecurity: Not on file  Transportation Needs: Not on file  Physical Activity: Not on file  Stress: Not on file  Social Connections: Not on file   Allergies  Allergen Reactions   Adhesive [Tape]    Bupropion    Hydrocodone-Ibuprofen    Latex Hives   Molds & Smuts    Oxycodone Itching   Tetracycline     REACTION: diarrhea   Tranxene [Clorazepate Dipotassium]     itche   Dust Mite Extract     And mold   Family History  Problem Relation Age of Onset   Kidney disease Mother    Diabetes Mother    Diabetes Father    Other Father         died from a brain bleed   Melanoma Sister    Other Sister        hx of PE's   Lung cancer Maternal Grandfather    Breast cancer Paternal Grandmother    Colon cancer Paternal Grandmother    Leukemia Paternal Grandfather    Colon cancer Other        grandmother   Breast cancer Other    Breast cancer Other        aunt   Other Daughter        PE while on birth control   Colon polyps Neg Hx    Esophageal cancer Neg Hx    Stomach cancer Neg Hx    Rectal cancer Neg Hx     Current Outpatient Medications (Endocrine & Metabolic):    SYNTHROID 150 MCG tablet, Take 150 mcg by mouth daily.   zoledronic acid (RECLAST) 5 MG/100ML SOLN injection, Inject by intravenous route. (Patient not taking: Reported on 10/22/2022)   Current Outpatient Medications (Respiratory):    loratadine (CLARITIN) 10 MG tablet, Take 10 mg by mouth.    Current Outpatient Medications (Other):    ALPRAZolam (XANAX) 1 MG tablet, Take by mouth. 1 and 1/2 tablet   cholecalciferol (VITAMIN D) 400 units TABS tablet, Take 400 Units by mouth.   dicyclomine (BENTYL) 10 MG capsule, Take 1 tablet twice daily or three times daily as needed for abdominal cramping and abdominal pain. (Patient not taking: Reported on 10/22/2022)   famotidine (PEPCID) 20 MG tablet, Take 1 tablet (20 mg total) by mouth at bedtime.   fish oil-omega-3 fatty acids 1000 MG capsule, Take 2 g by mouth daily.   meclizine (ANTIVERT) 12.5 MG tablet, TAKE 1 TABLET BY MOUTH 3 TIMES DAILY AS NEEDED FOR DIZZINESS.   mesalamine (APRISO) 0.375 g 24 hr capsule, Take 2 capsules (0.75 g total) by mouth daily.   mesalamine (CANASA) 1000 MG suppository, PLACE 1 SUPPOSITORY (1,000 MG TOTAL) RECTALLY AT BEDTIME. (Patient not taking: Reported on 10/22/2022)   omeprazole (PRILOSEC OTC) 20 MG tablet, Take 1 tablet (20 mg total) by mouth daily.   ondansetron (ZOFRAN) 4 MG tablet, Take 1 tablet (4 mg total) by mouth 2 (two) times daily as needed for nausea or vomiting.    PREMARIN vaginal cream, Place 0.5 g vaginally 2 (two) times a week.   Probiotic Product (ALIGN) 4 MG CAPS, Take by mouth.  tretinoin (RETIN-A) 0.025 % cream, SMARTSIG:Sparingly Topical Every Evening   Turmeric 06-998 MG CAPS, Take 500 mg by mouth daily.   Wheat Dextrin (BENEFIBER PO), Take by mouth every morning.   Reviewed prior external information including notes and imaging from  primary care provider As well as notes that were available from care everywhere and other healthcare systems.  Past medical history, social, surgical and family history all reviewed in electronic medical record.  No pertanent information unless stated regarding to the chief complaint.   Review of Systems:  No headache, visual changes, nausea, vomiting, diarrhea, constipation, dizziness, abdominal pain, skin rash, fevers, chills, night sweats, weight loss, swollen lymph nodes, body aches, joint swelling, chest pain, shortness of breath, mood changes. POSITIVE muscle aches  Objective  Blood pressure 116/68, pulse 68, height 5\' 7"  (1.702 m), weight 140 lb (63.5 kg), SpO2 98%.   General: No apparent distress alert and oriented x3 mood and affect normal, dressed appropriately.  HEENT: Pupils equal, extraocular movements intact  Respiratory: Patient's speak in full sentences and does not appear short of breath  Cardiovascular: No lower extremity edema, non tender, no erythema  Right hip is tender to palpation mostly over the greater trochanteric area.  About 4+ out of 5 strength of the right gluteal tendon compared to the contralateral side in the hip abductor complex.  Negative straight leg test.   Procedure: Real-time Ultrasound Guided Injection of right greater trochanteric bursitis secondary to patient's body habitus Device: GE Logiq Q7 Ultrasound guided injection is preferred based studies that show increased duration, increased effect, greater accuracy, decreased procedural pain, increased response rate, and  decreased cost with ultrasound guided versus blind injection.  Verbal informed consent obtained.  Time-out conducted.  Noted no overlying erythema, induration, or other signs of local infection.  Skin prepped in a sterile fashion.  Local anesthesia: Topical Ethyl chloride.  With sterile technique and under real time ultrasound guidance:  Greater trochanteric area was visualized and patient's bursa was noted. A 22-gauge 3 inch needle was inserted and 4 cc of 0.5% Marcaine and 1 cc of Kenalog 40 mg/dL was injected. Pictures taken Completed without difficulty  Pain immediately resolved suggesting accurate placement of the medication.  Advised to call if fevers/chills, erythema, induration, drainage, or persistent bleeding.  Impression: Technically successful ultrasound guided injection.    Impression and Recommendations:    The above documentation has been reviewed and is accurate and complete Judi Saa, DO

## 2022-11-28 ENCOUNTER — Other Ambulatory Visit: Payer: Self-pay

## 2022-11-28 ENCOUNTER — Ambulatory Visit: Payer: BC Managed Care – PPO | Admitting: Family Medicine

## 2022-11-28 VITALS — BP 116/68 | HR 68 | Ht 67.0 in | Wt 140.0 lb

## 2022-11-28 DIAGNOSIS — M7601 Gluteal tendinitis, right hip: Secondary | ICD-10-CM | POA: Diagnosis not present

## 2022-11-28 DIAGNOSIS — M7061 Trochanteric bursitis, right hip: Secondary | ICD-10-CM | POA: Diagnosis not present

## 2022-11-28 NOTE — Patient Instructions (Addendum)
Injection in GT today Good to see you! Ice in 6 hours Upper body only for next 2 days Zumba next week See you again in 6-8 weeks

## 2022-11-29 ENCOUNTER — Encounter: Payer: Self-pay | Admitting: Family Medicine

## 2022-11-29 NOTE — Assessment & Plan Note (Signed)
Seem to be more of a reactive bursitis.  Given injection and had near complete resolution of the pain immediately.  Will continue to monitor for the gluteal tendinitis again but do not see any true tearing appreciated.  Very mild thickening of the gluteal tendon noted at the insertion of the greater trochanteric area.  Continue with the hip abductor strengthening starting next week.  Discussed proper shoes, icing regimen.  Follow-up again in 6 to 8 weeks

## 2023-01-02 ENCOUNTER — Other Ambulatory Visit: Payer: Self-pay | Admitting: Gastroenterology

## 2023-01-02 ENCOUNTER — Other Ambulatory Visit: Payer: Self-pay | Admitting: Internal Medicine

## 2023-01-02 ENCOUNTER — Other Ambulatory Visit: Payer: Self-pay | Admitting: Physician Assistant

## 2023-01-08 NOTE — Progress Notes (Unsigned)
[name] [address] [place] [address] Phone: [phone] Subjective:   [name], am serving as a scribe for Dr. Antoine Primas.  I'm seeing this patient by the request  of:  Myrlene Broker, MD  CC: right hip pain   [id number]  11/28/2022 Seem to be more of a reactive bursitis. Given injection and had near complete resolution of the pain immediately. Will continue to monitor for the gluteal tendinitis again but do not see any true tearing appreciated. Very mild thickening of the gluteal tendon noted at the insertion of the greater trochanteric area. Continue with the hip abductor strengthening starting next week. Discussed proper shoes, icing regimen. Follow-up again in 6 to 8 weeks   Updated 01/09/2023 Christy [name] is a 67 y.o. female coming in with complaint of R hip pain. After 2 weeks was able to do stairs. Flared up again. For he beach wore a pain patch so she could get through the drive. Sore in the joint.       Past Medical History:  Diagnosis Date   Acute peptic ulcer, unspecified site, with hemorrhage, without mention of obstruction    Allergy    Anxiety    Arthritis    COVID-19 09/08/2020   Disorder of bone and cartilage, unspecified    Fibromyalgia    past hx   Frozen shoulder    GERD (gastroesophageal reflux disease)    Hyperlipidemia    PT UNAWARE OF THIS FINDING   Hypertension    07/2018- pt states no HTN since losing over 70#   Hyperthyroidism 08/2013   Internal hemorrhoids    Obstructive sleep apnea (adult) (pediatric)    Osteopenia    Other specific disorder of sleep of nonorganic origin    PONV (postoperative nausea and vomiting)    Restless legs syndrome (RLS)    Sleep apnea    07/2018 pt states surgery to correct but states she still has sleep apnea, just not as bad   Tubular adenoma of colon    Ulcerative proctitis (HCC)    Unspecified hypothyroidism    Vaginitis 2018   Vitamin D  deficiency    Past Surgical History:  Procedure Laterality Date   ABDOMINAL HYSTERECTOMY     BREAST CYST ASPIRATION Right    CESAREAN SECTION     COLONOSCOPY N/A 05/25/2013   Procedure: COLONOSCOPY;  Surgeon: Hart Carwin, MD;  Location: [facility];  Service: Endoscopy;  Laterality: N/A;   COLONOSCOPY     KNEE ARTHROSCOPY Right    x 2   LAPAROSCOPY     x 4   NASAL SEPTUM SURGERY     SIGMOIDOSCOPY     TONSILLECTOMY AND ADENOIDECTOMY     UPPER GASTROINTESTINAL ENDOSCOPY     Social History   Socioeconomic History   Marital status: Married    Spouse name: Not on file   Number of children: Not on file   Years of education: Not on file   Highest education level: Not on file  Occupational History   Occupation: Personal assistant  Tobacco Use   Smoking status: Former    Current packs/day: 0.00    Average packs/day: 0.1 packs/day for 15.0 years (1.5 ttl pk-yrs)    Types: Cigarettes    Start date: 02/26/1965    Quit date: 02/27/1980    Years since quitting: 42.8   Smokeless tobacco: Never  Vaping Use   Vaping status: Never Used  Substance and Sexual Activity  Alcohol use: No   Drug use: No   Sexual activity: Not on file  Other Topics Concern   Not on file  Social History Narrative   Not on file   Social Determinants of Health   Financial Resource Strain: Not on file  Food Insecurity: Not on file  Transportation Needs: Not on file  Physical Activity: Not on file  Stress: Not on file  Social Connections: Not on file   Allergies  Allergen Reactions   Adhesive [Tape]    Bupropion    Hydrocodone-Ibuprofen    Latex Hives   Molds & Smuts    Oxycodone Itching   Tetracycline     REACTION: diarrhea   Tranxene [Clorazepate Dipotassium]     itche   Dust Mite Extract     And mold   Family History  Problem Relation Age of Onset   Kidney disease Mother    Diabetes Mother    Diabetes Father    Other Father        died from a brain bleed   Melanoma Sister    Other  Sister        hx of PE's   Lung cancer Maternal Grandfather    Breast cancer Paternal Grandmother    Colon cancer Paternal Grandmother    Leukemia Paternal Grandfather    Colon cancer Other        grandmother   Breast cancer Other    Breast cancer Other        aunt   Other Daughter        PE while on birth control   Colon polyps Neg Hx    Esophageal cancer Neg Hx    Stomach cancer Neg Hx    Rectal cancer Neg Hx     Current Outpatient Medications (Endocrine & Metabolic):    SYNTHROID 150 MCG tablet, Take 150 mcg by mouth daily.   zoledronic acid (RECLAST) 5 MG/100ML SOLN injection, Inject by intravenous route. (Patient not taking: Reported on 10/22/2022)   Current Outpatient Medications (Respiratory):    loratadine (CLARITIN) 10 MG tablet, Take 10 mg by mouth.    Current Outpatient Medications (Other):    ALPRAZolam (XANAX) 1 MG tablet, Take by mouth. 1 and 1/2 tablet   cholecalciferol (VITAMIN D) 400 units TABS tablet, Take 400 Units by mouth.   dicyclomine (BENTYL) 10 MG capsule, TAKE 1 TABLET TWICE DAILY OR THREE TIMES DAILY AS NEEDED FOR ABDOMINAL CRAMPING AND ABDOMINAL PAIN.   famotidine (PEPCID) 20 MG tablet, TAKE 1 TABLET BY MOUTH EVERYDAY AT BEDTIME   fish oil-omega-3 fatty acids 1000 MG capsule, Take 2 g by mouth daily.   meclizine (ANTIVERT) 12.5 MG tablet, TAKE 1 TABLET BY MOUTH 3 TIMES A DAY AS NEEDED FOR DIZZINESS   mesalamine (APRISO) 0.375 g 24 hr capsule, Take 2 capsules (0.75 g total) by mouth daily.   mesalamine (CANASA) 1000 MG suppository, PLACE 1 SUPPOSITORY (1,000 MG TOTAL) RECTALLY AT BEDTIME. (Patient not taking: Reported on 10/22/2022)   omeprazole (PRILOSEC OTC) 20 MG tablet, TAKE 1 TABLET BY MOUTH EVERY DAY   ondansetron (ZOFRAN) 4 MG tablet, Take 1 tablet (4 mg total) by mouth 2 (two) times daily as needed for nausea or vomiting.   PREMARIN vaginal cream, Place 0.5 g vaginally 2 (two) times a week.   Probiotic Product (ALIGN) 4 MG CAPS, Take by  mouth.   tretinoin (RETIN-A) 0.025 % cream, SMARTSIG:Sparingly Topical Every Evening   Turmeric 06-998 MG CAPS, Take 500 mg  by mouth daily.   Wheat Dextrin (BENEFIBER PO), Take by mouth every morning.   Reviewed prior external information including notes and imaging from  primary care provider As well as notes that were available from care everywhere and other healthcare systems.  Past medical history, social, surgical and family history all reviewed in electronic medical record.  No pertanent information unless stated regarding to the chief complaint.   Review of Systems:  No headache, visual changes, nausea, vomiting, diarrhea, constipation, dizziness, abdominal pain, skin rash, fevers, chills, night sweats, weight loss, swollen lymph nodes, body aches, joint swelling, chest pain, shortness of breath, mood changes. POSITIVE muscle aches  Objective  Blood pressure 114/84, pulse 75, height 5\' 7"  (1.702 m), weight 141 lb (64 kg), SpO2 95%.   General: No apparent distress alert and oriented x3 mood and affect normal, dressed appropriately.  HEENT: Pupils equal, extraocular movements intact  Respiratory: Patient's speak in full sentences and does not appear short of breath  Cardiovascular: No lower extremity edema, non tender, no erythema  Right hip exam shows tightness, more comfortable standing  Negative SLT  No weakness, severe tenderness over the gluteal tendon    Impression and Recommendations:    The above documentation has been reviewed and is accurate and complete Judi Saa, DO

## 2023-01-09 ENCOUNTER — Other Ambulatory Visit: Payer: Self-pay | Admitting: Gastroenterology

## 2023-01-09 ENCOUNTER — Other Ambulatory Visit: Payer: Self-pay

## 2023-01-09 ENCOUNTER — Ambulatory Visit: Payer: BC Managed Care – PPO | Admitting: Family Medicine

## 2023-01-09 VITALS — BP 114/84 | HR 75 | Ht 67.0 in | Wt 141.0 lb

## 2023-01-09 DIAGNOSIS — M7601 Gluteal tendinitis, right hip: Secondary | ICD-10-CM

## 2023-01-09 DIAGNOSIS — M7061 Trochanteric bursitis, right hip: Secondary | ICD-10-CM

## 2023-01-09 NOTE — Assessment & Plan Note (Signed)
Patient is doing significantly better and now having worsening pain again.  We discussed potentially repeating PRP.  Patient would like to see if she can avoid that, but we also discussed with patient about the possibility of nitroglycerin but given patient is concerning secondary to history of migraines.  Restart formal physical therapy which I think would be a good idea so patient can increase activity as well.  Follow-up again in 6 to 8 weeks otherwise.

## 2023-01-09 NOTE — Patient Instructions (Addendum)
PT @ [facility] Consider PRP again See you again in 8 weeks Happy Holidays

## 2023-01-10 ENCOUNTER — Encounter: Payer: Self-pay | Admitting: Family Medicine

## 2023-01-30 ENCOUNTER — Encounter: Payer: Self-pay | Admitting: Gastroenterology

## 2023-01-30 ENCOUNTER — Ambulatory Visit: Payer: BC Managed Care – PPO | Admitting: Gastroenterology

## 2023-01-30 ENCOUNTER — Other Ambulatory Visit: Payer: Self-pay | Admitting: Gastroenterology

## 2023-01-30 VITALS — BP 118/64 | HR 58 | Ht 67.5 in | Wt 138.8 lb

## 2023-01-30 DIAGNOSIS — K5904 Chronic idiopathic constipation: Secondary | ICD-10-CM

## 2023-01-30 DIAGNOSIS — K5900 Constipation, unspecified: Secondary | ICD-10-CM

## 2023-01-30 DIAGNOSIS — K219 Gastro-esophageal reflux disease without esophagitis: Secondary | ICD-10-CM

## 2023-01-30 DIAGNOSIS — K5120 Ulcerative (chronic) proctitis without complications: Secondary | ICD-10-CM | POA: Diagnosis not present

## 2023-01-30 DIAGNOSIS — R1310 Dysphagia, unspecified: Secondary | ICD-10-CM

## 2023-01-30 DIAGNOSIS — S73109A Unspecified sprain of unspecified hip, initial encounter: Secondary | ICD-10-CM

## 2023-01-30 MED ORDER — OMEPRAZOLE MAGNESIUM 20 MG PO TBEC: 20.0000 mg | DELAYED_RELEASE_TABLET | Freq: Every day | ORAL | 2 refills | Status: DC

## 2023-01-30 MED ORDER — MESALAMINE ER 0.375 G PO CP24: 750.0000 mg | ORAL_CAPSULE | Freq: Every day | ORAL | 3 refills | Status: DC

## 2023-01-30 MED ORDER — FAMOTIDINE 20 MG PO TABS: 20.0000 mg | ORAL_TABLET | Freq: Every day | ORAL | 3 refills | Status: AC

## 2023-01-30 NOTE — Patient Instructions (Addendum)
VISIT SUMMARY:  During today's visit, we discussed your ongoing health issues, including esophageal dysphagia, ulcerative colitis, constipation, and a glute and labrum tear. You reported significant improvement in swallowing after your recent EGD with dilation and noted that your ulcerative colitis is well-controlled with your current medication regimen. We also reviewed your history of constipation, particularly during travel, and your progress with managing your glute and labrum tear.  YOUR PLAN:  -ESOPHAGEAL DYSPHAGIA: Esophageal dysphagia is difficulty swallowing due to issues in the esophagus. You have shown improvement after your EGD with dilation. To help minimize reflux, continue to maintain an upright posture during and atleast 2-3 hours after meals.  -ULCERATIVE COLITIS: Ulcerative colitis is a chronic condition causing inflammation in the colon. Your condition is stable with Apriso, and you should continue taking 2 pills daily. We plan to schedule a colonoscopy in 3-5 years (2026-2028) to monitor your condition.  -CONSTIPATION: Constipation is difficulty in passing stools. You experienced severe constipation during travel. To prevent this, use Miralax during travel, and you may use a suppository or enema occasionally if needed.  -GLUTE AND LABRUM TEAR: A glute and labrum tear involves injury to the muscles and cartilage in the hip area. Your condition has improved by 90% and is manageable with activity modification. Continue with your current management plan.  -GENERAL HEALTH MAINTENANCE: For your general health, we will conduct annual blood work and a fecal calprotectin test at your next visit. Ensure you have refills for Apriso available.  INSTRUCTIONS:  Please schedule a return visit in 6 months. We will conduct annual blood work and a fecal calprotectin test at your next visit. Ensure you have refills for Apriso available.  I appreciate the  opportunity to care for you  Thank You    Marsa Aris , MD

## 2023-01-30 NOTE — Progress Notes (Signed)
Christy [name]    [id number]    [birth year]  Primary Care Physician:Crawford, Austin Miles, MD  Referring Physician: Myrlene Broker, MD [id number] [place],  [place] [address]   Chief complaint:  GERD, ulcerative colitis  Discussed the use of AI scribe software for clinical note transcription with the patient, who gave verbal consent to proceed.  History of Present Illness   The patient, with a history of ulcerative colitis and chronic GERD with mild esophageal motility issues, reports significant improvement in swallowing following an EGD with dilation. She notes only a few instances of food not going down properly. The patient is currently on a regimen of Apriso, taking two pills daily for ulcerative colitis, and reports no active inflammation.  The patient has been using Canasa suppositories in the past during episodes of bleeding. However, she has not been using these suppositories recently as her condition is under control.   Fecal calprotectin was within normal limits.  August 2024  The patient also reports a history of constipation, particularly when deviating from her regular diet of fruits, Greek yogurt, and high-protein foods. She also takes Benefiber once daily. During a recent vacation, the patient experienced severe constipation, which required the use of a suppository, magnesium citrate, and a fleet enema for relief.  The patient has a history of a glute and labrum tear, which has been causing inflammation, bursitis, and tendonitis for the past 14 months. The patient reports that the condition is 90% better and is now manageable.   EGD October 24, 2022 - Z- line regular, 38 cm from the incisors. - No endoscopic esophageal abnormality to explain patient' s dysphagia. Esophagus dilated. Dilated. - Normal stomach. - Normal examined duodenum.  Colonoscopy 09-22-21  - Inflammation was not found on today's exam (based on the endoscopic appearance of the  mucosa). This was graded as Mayo Score 0 (normal or inactive disease), quiescent compared to previous examinations.  - The examination was otherwise normal.  - No specimens collected.   Colonoscopy 08-22-18 - Four 1 to 2 mm polyps in the rectum and in the cecum, removed with a cold biopsy forceps. Resected and retrieved.  - One 5 mm polyp in the transverse colon, removed with a cold snare. Resected and retrieved.  - Non-bleeding internal hemorrhoids.  - Normal mucosa in the entire examined colon. 1. Surgical [P], transverse and cecum, polyps (2) - HYPERPLASTIC POLYP(S). - NO ADENOMATOUS CHANGE OR MALIGNANCY. 2. Surgical [P], rectum, polyps (3) - HYPERPLASTIC POLYP(S). - NO ADENOMATOUS CHANGE OR MALIGNANCY.   Flexible sigmoidoscopy May 04, 2016 proctitis otherwise normal exam.  Biopsy showed moderately active colitis, negative for CMV.   Colonoscopy May 25, 2013 by Dr. Juanda Chance showed normal colon, random biopsies were obtained, showed benign mucosa with no evidence of inflammatory bowel disease.  Recall colonoscopy in 5 years  Outpatient Encounter Medications as of 01/30/2023  Medication Sig   ALPRAZolam (XANAX) 1 MG tablet Take by mouth. 1 and 1/2 tablet   cholecalciferol (VITAMIN D) 400 units TABS tablet Take 400 Units by mouth.   dicyclomine (BENTYL) 10 MG capsule TAKE 1 TABLET TWICE DAILY OR THREE TIMES DAILY AS NEEDED FOR ABDOMINAL CRAMPING AND ABDOMINAL PAIN.   fish oil-omega-3 fatty acids 1000 MG capsule Take 2 g by mouth daily.   loratadine (CLARITIN) 10 MG tablet Take 10 mg by mouth.   meclizine (ANTIVERT) 12.5 MG tablet TAKE 1 TABLET BY MOUTH 3 TIMES A  DAY AS NEEDED FOR DIZZINESS   mesalamine (CANASA) 1000 MG suppository PLACE 1 SUPPOSITORY (1,000 MG TOTAL) RECTALLY AT BEDTIME.   ondansetron (ZOFRAN) 4 MG tablet Take 1 tablet (4 mg total) by mouth 2 (two) times daily as needed for nausea or vomiting.   PREMARIN vaginal cream Place 0.5 g vaginally 2 (two) times a week.    Probiotic Product (ALIGN) 4 MG CAPS Take by mouth.   SYNTHROID 150 MCG tablet Take 150 mcg by mouth daily.   tretinoin (RETIN-A) 0.025 % cream SMARTSIG:Sparingly Topical Every Evening   Turmeric 06-998 MG CAPS Take 500 mg by mouth daily.   Wheat Dextrin (BENEFIBER PO) Take by mouth every morning.   zoledronic acid (RECLAST) 5 MG/100ML SOLN injection    [DISCONTINUED] famotidine (PEPCID) 20 MG tablet TAKE 1 TABLET BY MOUTH EVERYDAY AT BEDTIME   [DISCONTINUED] mesalamine (APRISO) 0.375 g 24 hr capsule Take 2 capsules (0.75 g total) by mouth daily.   [DISCONTINUED] omeprazole (PRILOSEC OTC) 20 MG tablet TAKE 1 TABLET BY MOUTH EVERY DAY   famotidine (PEPCID) 20 MG tablet Take 1 tablet (20 mg total) by mouth at bedtime.   mesalamine (APRISO) 0.375 g 24 hr capsule Take 2 capsules (0.75 g total) by mouth daily.   omeprazole (PRILOSEC OTC) 20 MG tablet Take 1 tablet (20 mg total) by mouth daily.   No facility-administered encounter medications on file as of 01/30/2023.    Allergies as of 01/30/2023 - Review Complete 01/30/2023  Allergen Reaction Noted   Adhesive [tape]  05/20/2013   Bupropion  09/01/2021   Hydrocodone-ibuprofen  09/01/2021   Latex Hives 05/20/2013   Molds & smuts  09/01/2021   Oxycodone Itching 05/09/2012   Tetracycline     Tranxene [clorazepate dipotassium]  09/06/2010   Dust mite extract  01/30/2018    Past Medical History:  Diagnosis Date   Acute peptic ulcer, unspecified site, with hemorrhage, without mention of obstruction    Allergy    Anxiety    Arthritis    COVID-19 09/08/2020   Disorder of bone and cartilage, unspecified    Fibromyalgia    past hx   Frozen shoulder    GERD (gastroesophageal reflux disease)    Hyperlipidemia    PT UNAWARE OF THIS FINDING   Hypertension    07/2018- pt states no HTN since losing over 70#   Hyperthyroidism 08/2013   Internal hemorrhoids    Obstructive sleep apnea (adult) (pediatric)    Osteopenia    Other specific disorder  of sleep of nonorganic origin    PONV (postoperative nausea and vomiting)    Restless legs syndrome (RLS)    Sleep apnea    07/2018 pt states surgery to correct but states she still has sleep apnea, just not as bad   Tubular adenoma of colon    Ulcerative proctitis (HCC)    Unspecified hypothyroidism    Vaginitis 2018   Vitamin D deficiency     Past Surgical History:  Procedure Laterality Date   ABDOMINAL HYSTERECTOMY     BREAST CYST ASPIRATION Right    CESAREAN SECTION     COLONOSCOPY N/A 05/25/2013   Procedure: COLONOSCOPY;  Surgeon: Hart Carwin, MD;  Location: [facility];  Service: Endoscopy;  Laterality: N/A;   COLONOSCOPY     KNEE ARTHROSCOPY Right    x 2   LAPAROSCOPY     x 4   NASAL SEPTUM SURGERY     SIGMOIDOSCOPY     TONSILLECTOMY AND ADENOIDECTOMY  UPPER GASTROINTESTINAL ENDOSCOPY      Family History  Problem Relation Age of Onset   Kidney disease Mother    Diabetes Mother    Diabetes Father    Other Father        died from a brain bleed   Melanoma Sister    Other Sister        hx of PE's   Lung cancer Maternal Grandfather    Breast cancer Paternal Grandmother    Colon cancer Paternal Grandmother    Leukemia Paternal Grandfather    Colon cancer Other        grandmother   Breast cancer Other    Breast cancer Other        aunt   Other Daughter        PE while on birth control   Colon polyps Neg Hx    Esophageal cancer Neg Hx    Stomach cancer Neg Hx    Rectal cancer Neg Hx     Social History   Socioeconomic History   Marital status: Married    Spouse name: Not on file   Number of children: Not on file   Years of education: Not on file   Highest education level: Not on file  Occupational History   Occupation: Manufacturing engineer  Tobacco Use   Smoking status: Former    Current packs/day: 0.00    Average packs/day: 0.1 packs/day for 15.0 years (1.5 ttl pk-yrs)    Types: Cigarettes    Start date: 02/26/1965    Quit date: 02/27/1980     Years since quitting: 42.9   Smokeless tobacco: Never  Vaping Use   Vaping status: Never Used  Substance and Sexual Activity   Alcohol use: No   Drug use: No   Sexual activity: Not on file  Other Topics Concern   Not on file  Social History Narrative   Not on file   Social Determinants of Health   Financial Resource Strain: Not on file  Food Insecurity: Not on file  Transportation Needs: Not on file  Physical Activity: Not on file  Stress: Not on file  Social Connections: Not on file  Intimate Partner Violence: Not on file      Review of systems: All other review of systems negative except as mentioned in the HPI.   Physical Exam: Vitals:   01/30/23 0925  BP: 118/64  Pulse: (!) 58  SpO2: 97%   Body mass index is 21.42 kg/m. Gen:      No acute distress HEENT:  sclera anicteric CV: s1s2 rrr, no murmur Lungs: B/l clear. Abd:      soft, non-tender; no palpable masses, no distension Ext:    No edema Neuro: alert and oriented x 3 Psych: normal mood and affect  Data Reviewed:  Reviewed labs, radiology imaging, old records and pertinent past GI work up     Assessment and Plan 67 year old very pleasant female with chronic GERD, dysphagia, ulcerative proctitis, and clinical remission on mesalamine   Esophageal Dysphagia Improvement post-EGD with dilation. Occasional difficulty swallowing. Discussed the impact of aging, acid reflux, and esophageal motility on swallowing. -Continue upright posture during and after meals to minimize reflux.  Continue daily omeprazole and Pepcid at bedtime as needed  Ulcerative Colitis Stable with Apriso 2 pills daily. Last fecal calprotectin was 49 indicating controlled inflammation. -Continue Apriso 2 pills daily. -Plan for colonoscopy in 3-5 years (2026-2028) given controlled inflammation.  Constipation History of severe constipation during travel. Used  Dulcolax suppository, magnesium citrate, and fleet enema with limited  success. -Recommended use of Miralax during travel to prevent constipation. -Occasional use of suppository or enema is acceptable if needed.  Glute and Labrum Tear Chronic issue with 90% improvement. Currently managed with activity modification. -Continue current management and activity modification.  General Health Maintenance / Followup Plans -Return visit in 6 months. -Annual blood work and fecal calprotectin at next visit. -Ensure refills for Apriso are available.       This visit required 30 minutes of patient care (this includes precharting, chart review, review of results, face-to-face time used for counseling as well as treatment plan and follow-up. The patient was provided an opportunity to ask questions and all were answered. The patient agreed with the plan and demonstrated an understanding of the instructions.  Iona Beard , MD    CC: Myrlene Broker, *

## 2023-02-15 ENCOUNTER — Encounter: Payer: Self-pay | Admitting: Family Medicine

## 2023-03-13 ENCOUNTER — Ambulatory Visit: Payer: BC Managed Care – PPO | Admitting: Family Medicine

## 2023-05-24 ENCOUNTER — Encounter: Admitting: Internal Medicine

## 2023-06-05 ENCOUNTER — Ambulatory Visit: Admitting: Internal Medicine

## 2023-07-05 ENCOUNTER — Encounter: Payer: Self-pay | Admitting: Internal Medicine

## 2023-07-05 ENCOUNTER — Ambulatory Visit (INDEPENDENT_AMBULATORY_CARE_PROVIDER_SITE_OTHER): Admitting: Internal Medicine

## 2023-07-05 VITALS — BP 102/64 | HR 59 | Temp 98.2°F | Ht 67.5 in | Wt 140.2 lb

## 2023-07-05 DIAGNOSIS — K5120 Ulcerative (chronic) proctitis without complications: Secondary | ICD-10-CM

## 2023-07-05 DIAGNOSIS — F5101 Primary insomnia: Secondary | ICD-10-CM | POA: Diagnosis not present

## 2023-07-05 DIAGNOSIS — E782 Mixed hyperlipidemia: Secondary | ICD-10-CM | POA: Diagnosis not present

## 2023-07-05 DIAGNOSIS — Z0000 Encounter for general adult medical examination without abnormal findings: Secondary | ICD-10-CM | POA: Diagnosis not present

## 2023-07-05 DIAGNOSIS — E039 Hypothyroidism, unspecified: Secondary | ICD-10-CM

## 2023-07-05 LAB — CBC
HCT: 41.8 % (ref 36.0–46.0)
Hemoglobin: 13.8 g/dL (ref 12.0–15.0)
MCHC: 33 g/dL (ref 30.0–36.0)
MCV: 92.9 fl (ref 78.0–100.0)
Platelets: 273 10*3/uL (ref 150.0–400.0)
RBC: 4.5 Mil/uL (ref 3.87–5.11)
RDW: 13.7 % (ref 11.5–15.5)
WBC: 7.9 10*3/uL (ref 4.0–10.5)

## 2023-07-05 LAB — LIPID PANEL
Cholesterol: 189 mg/dL (ref 0–200)
HDL: 83 mg/dL (ref >39.00)
LDL Cholesterol: 92 mg/dL (ref 0–99)
NonHDL: 105.6
Total CHOL/HDL Ratio: 2
Triglycerides: 67 mg/dL (ref 0.0–149.0)
VLDL: 13.4 mg/dL (ref 0.0–40.0)

## 2023-07-05 LAB — COMPREHENSIVE METABOLIC PANEL WITH GFR
ALT: 19 U/L (ref 0–35)
AST: 21 U/L (ref 0–37)
Albumin: 4.4 g/dL (ref 3.5–5.2)
Alkaline Phosphatase: 43 U/L (ref 39–117)
BUN: 22 mg/dL (ref 6–23)
CO2: 32 meq/L (ref 19–32)
Calcium: 9.9 mg/dL (ref 8.4–10.5)
Chloride: 99 meq/L (ref 96–112)
Creatinine, Ser: 0.98 mg/dL (ref 0.40–1.20)
GFR: 60.16 mL/min (ref >60.00)
Glucose, Bld: 92 mg/dL (ref 70–99)
Potassium: 4.8 meq/L (ref 3.5–5.1)
Sodium: 138 meq/L (ref 135–145)
Total Bilirubin: 0.5 mg/dL (ref 0.2–1.2)
Total Protein: 7.2 g/dL (ref 6.0–8.3)

## 2023-07-05 LAB — TSH: TSH: 0.1 u[IU]/mL — ABNORMAL LOW (ref 0.35–5.50)

## 2023-07-05 MED ORDER — FLUCONAZOLE 150 MG PO TABS
150.0000 mg | ORAL_TABLET | Freq: Once | ORAL | 0 refills | Status: AC
Start: 2023-07-05 — End: 2023-07-05

## 2023-07-05 NOTE — Assessment & Plan Note (Signed)
 Flu shot yearly. Pneumonia complete. Shingrix due at pharmacy. Tetanus due at pharmacy. Colonoscopy up to date. Mammogram up to date with gyn, pap smear aged out and dexa up to date with gyn. Counseled about sun safety and mole surveillance. Counseled about the dangers of distracted driving. Given 10 year screening recommendations.

## 2023-07-05 NOTE — Assessment & Plan Note (Signed)
 Checking lipid panel and adjust as needed.

## 2023-07-05 NOTE — Patient Instructions (Signed)
 We have sent in the diflucan to take.

## 2023-07-05 NOTE — Assessment & Plan Note (Signed)
Checking TSH and adjust levothyroxine as needed.

## 2023-07-05 NOTE — Progress Notes (Signed)
   Subjective:   Patient ID: Christy [name], female    DOB: 02-Apr-1955, 68 y.o.   MRN: [id number]  HPI The patient is here for physical. [place]  PMH, [facility], social history reviewed and updated  Review of Systems  Constitutional: Negative.   HENT: Negative.    Eyes: Negative.   Respiratory:  Negative for cough, chest tightness and shortness of breath.   Cardiovascular:  Negative for chest pain, palpitations and leg swelling.  Gastrointestinal:  Negative for abdominal distention, abdominal pain, constipation, diarrhea, nausea and vomiting.  Musculoskeletal: Negative.   Skin: Negative.   Neurological: Negative.   Psychiatric/Behavioral: Negative.      Objective:  Physical Exam Constitutional:      Appearance: She is well-developed.  HENT:     Head: Normocephalic and atraumatic.  Cardiovascular:     Rate and Rhythm: Normal rate and regular rhythm.  Pulmonary:     Effort: Pulmonary effort is normal. No respiratory distress.     Breath sounds: Normal breath sounds. No wheezing or rales.  Abdominal:     General: Bowel sounds are normal. There is no distension.     Palpations: Abdomen is soft.     Tenderness: There is no abdominal tenderness. There is no rebound.  Musculoskeletal:     Cervical back: Normal range of motion.  Skin:    General: Skin is warm and dry.  Neurological:     Mental Status: She is alert and oriented to person, place, and time.     Coordination: Coordination normal.     Vitals:   07/05/23 1146  BP: 102/64  Pulse: (!) 59  Temp: 98.2 F (36.8 C)  SpO2: 99%  Weight: 140 lb 3.2 oz (63.6 kg)  Height: 5' 7.5" (1.715 m)    Assessment & Plan:

## 2023-07-05 NOTE — Assessment & Plan Note (Signed)
 Still taking alprazolam 1.5 mg at bedtime.

## 2023-07-05 NOTE — Assessment & Plan Note (Signed)
 Much better controlled this year than last year. Seeing GI.

## 2023-07-08 ENCOUNTER — Encounter: Payer: Self-pay | Admitting: Internal Medicine

## 2023-07-10 ENCOUNTER — Ambulatory Visit: Payer: Self-pay

## 2023-07-31 ENCOUNTER — Ambulatory Visit (INDEPENDENT_AMBULATORY_CARE_PROVIDER_SITE_OTHER): Admitting: Emergency Medicine

## 2023-07-31 ENCOUNTER — Ambulatory Visit: Payer: Self-pay

## 2023-07-31 ENCOUNTER — Encounter: Payer: Self-pay | Admitting: Emergency Medicine

## 2023-07-31 VITALS — BP 130/70 | HR 60 | Temp 98.1°F | Ht 67.5 in | Wt 144.0 lb

## 2023-07-31 DIAGNOSIS — Z8719 Personal history of other diseases of the digestive system: Secondary | ICD-10-CM | POA: Insufficient documentation

## 2023-07-31 DIAGNOSIS — R197 Diarrhea, unspecified: Secondary | ICD-10-CM

## 2023-07-31 LAB — CBC WITH DIFFERENTIAL/PLATELET
Basophils Absolute: 0.1 10*3/uL (ref 0.0–0.1)
Basophils Relative: 0.8 % (ref 0.0–3.0)
Eosinophils Absolute: 0 10*3/uL (ref 0.0–0.7)
Eosinophils Relative: 0.2 % (ref 0.0–5.0)
HCT: 38.5 % (ref 36.0–46.0)
Hemoglobin: 12.9 g/dL (ref 12.0–15.0)
Lymphocytes Relative: 12.9 % (ref 12.0–46.0)
Lymphs Abs: 1.1 10*3/uL (ref 0.7–4.0)
MCHC: 33.4 g/dL (ref 30.0–36.0)
MCV: 90.8 fl (ref 78.0–100.0)
Monocytes Absolute: 0.5 10*3/uL (ref 0.1–1.0)
Monocytes Relative: 5.8 % (ref 3.0–12.0)
Neutro Abs: 6.7 10*3/uL (ref 1.4–7.7)
Neutrophils Relative %: 80.3 % — ABNORMAL HIGH (ref 43.0–77.0)
Platelets: 290 10*3/uL (ref 150.0–400.0)
RBC: 4.24 Mil/uL (ref 3.87–5.11)
RDW: 14 % (ref 11.5–15.5)
WBC: 8.4 10*3/uL (ref 4.0–10.5)

## 2023-07-31 LAB — COMPREHENSIVE METABOLIC PANEL WITH GFR
ALT: 18 U/L (ref 0–35)
AST: 20 U/L (ref 0–37)
Albumin: 4.3 g/dL (ref 3.5–5.2)
Alkaline Phosphatase: 32 U/L — ABNORMAL LOW (ref 39–117)
BUN: 13 mg/dL (ref 6–23)
CO2: 29 meq/L (ref 19–32)
Calcium: 9.5 mg/dL (ref 8.4–10.5)
Chloride: 93 meq/L — ABNORMAL LOW (ref 96–112)
Creatinine, Ser: 0.87 mg/dL (ref 0.40–1.20)
GFR: 69.36 mL/min (ref >60.00)
Glucose, Bld: 119 mg/dL — ABNORMAL HIGH (ref 70–99)
Potassium: 4.1 meq/L (ref 3.5–5.1)
Sodium: 128 meq/L — ABNORMAL LOW (ref 135–145)
Total Bilirubin: 0.6 mg/dL (ref 0.2–1.2)
Total Protein: 7 g/dL (ref 6.0–8.3)

## 2023-07-31 LAB — URINALYSIS
Bilirubin Urine: NEGATIVE
Hgb urine dipstick: NEGATIVE
Ketones, ur: NEGATIVE
Leukocytes,Ua: NEGATIVE
Nitrite: NEGATIVE
Specific Gravity, Urine: <=1.005 — AB (ref 1.000–1.030)
Total Protein, Urine: NEGATIVE
Urine Glucose: NEGATIVE
Urobilinogen, UA: 0.2 (ref 0.0–1.0)
pH: 7 (ref 5.0–8.0)

## 2023-07-31 NOTE — Patient Instructions (Signed)
 Diarrhea, Adult Diarrhea is when you pass loose and sometimes watery poop (stool) often. Diarrhea can make you feel weak and cause you to lose water in your body (get dehydrated). Losing water in your body can cause you to: Feel tired and thirsty. Have a dry mouth. Go pee (urinate) less often. Diarrhea often lasts 2-3 days. It can last longer if it is a sign of something more serious. Be sure to treat your diarrhea as told by your doctor. Follow these instructions at home: Eating and drinking     Follow these instructions as told by your doctor: Take an ORS (oral rehydration solution). This is a drink that helps you replace fluids and minerals your body lost. It is sold at pharmacies and stores. Drink enough fluid to keep your pee (urine) pale yellow. Drink fluids such as: Water. You can also get fluids by sucking on ice chips. Diluted fruit juice. Low-calorie sports drinks. Milk. Avoid drinking fluids that have a lot of sugar or caffeine in them. These include soda, energy drinks, and regular sports drinks. Avoid alcohol. Eat bland, easy-to-digest foods in small amounts as you are able. These foods include: Bananas. Applesauce. Rice. Low-fat (lean) meats. Toast. Crackers. Avoid spicy or fatty foods.  Medicines Take over-the-counter and prescription medicines only as told by your doctor. If you were prescribed antibiotics, take them as told by your doctor. Do not stop taking them even if you start to feel better. General instructions  Wash your hands often using soap and water for 20 seconds. If soap and water are not available, use hand sanitizer. Others in your home should wash their hands as well. Wash your hands: After using the toilet or changing a diaper. Before preparing, cooking, or serving food. While caring for a sick person. While visiting someone in a hospital. Rest at home while you get better. Take a warm bath to help with any burning or pain from having  diarrhea. Watch your condition for any changes. Contact a doctor if: You have a fever. Your diarrhea gets worse. You have new symptoms. You vomit every time you eat or drink. You feel light-headed, dizzy, or you have a headache. You have muscle cramps. You have signs of losing too much water in your body, such as: Dark pee, very little pee, or no pee. Cracked lips. Dry mouth. Sunken eyes. Sleepiness. Weakness. You have bloody or black poop or poop that looks like tar. You have very bad pain, cramping, or bloating in your belly (abdomen). Your skin feels cold and clammy. You feel confused. Get help right away if: You have chest pain. Your heart is beating very quickly. You have trouble breathing or you are breathing very quickly. You feel very weak or you faint. These symptoms may be an emergency. Get help right away. Call 911. Do not wait to see if the symptoms will go away. Do not drive yourself to the hospital. This information is not intended to replace advice given to you by your health care provider. Make sure you discuss any questions you have with your health care provider. Document Revised: 08/01/2021 Document Reviewed: 08/01/2021 Elsevier Patient Education  2024 [employer].

## 2023-07-31 NOTE — Progress Notes (Signed)
 Christy [name] 68 y.o.   Chief Complaint  Patient presents with   Diarrhea    Patient states she has been having diarrhea for about week it did get better, stool is still soft. She started to have some nausea this morning and took a zofran . She mentions she was dx with vaginitis last Friday and has been having frequent urinating. She does mention having some lower right abdominal pain but says it may be from the vaginitis      HISTORY OF PRESENT ILLNESS: Acute problem visit today.  Patient of Dr. Bambi Lever. This is a 68 y.o. female with history of ulcerative colitis and IBS complaining of very soft diarrhea for 1 week. Has had 2 courses of antibiotics since last April.  First azithromycin for sinus infection and recently antibiotic for UTI Has also developed yeast vaginitis for which she took Diflucan  twice Denies burning on the urine.  Urine is light color. Felt nauseous this morning.  Took Zofran .  Feels dehydrated. No other associated symptoms. No other complaints or medical concerns today.   Diarrhea  Pertinent negatives include no chills, coughing, fever or headaches.     Prior to Admission medications   Medication Sig Start Date End Date Taking? Authorizing Provider  ALPRAZolam (XANAX) 1 MG tablet Take by mouth. 1 and 1/2 tablet 10/24/20   [provider]  cholecalciferol (VITAMIN D ) 400 units TABS tablet Take 400 Units by mouth.    [provider]  famotidine  (PEPCID ) 20 MG tablet Take 1 tablet (20 mg total) by mouth at bedtime. 01/30/23   Nandigam, Kavitha V, MD  loratadine (CLARITIN) 10 MG tablet Take 10 mg by mouth.    [provider]  meclizine  (ANTIVERT ) 12.5 MG tablet TAKE 1 TABLET BY MOUTH 3 TIMES A DAY AS NEEDED FOR DIZZINESS 01/04/23   Adelia Homestead, MD  mesalamine  (APRISO ) 0.375 g 24 hr capsule Take 2 capsules (0.75 g total) by mouth daily. 01/30/23   Nandigam, Kavitha V, MD  omeprazole  (PRILOSEC  OTC) 20 MG tablet TAKE 1  TABLET BY MOUTH EVERY DAY 01/30/23   Nandigam, Kavitha V, MD  ondansetron  (ZOFRAN ) 4 MG tablet Take 1 tablet (4 mg total) by mouth 2 (two) times daily as needed for nausea or vomiting. 10/20/20   Esterwood, Amy S, PA-C  PREMARIN vaginal cream Place 0.5 g vaginally 2 (two) times a week. 06/05/18   [provider]  Probiotic Product (ALIGN) 4 MG CAPS Take by mouth.    [provider]  SYNTHROID 150 MCG tablet Take 150 mcg by mouth daily. 03/17/14   [provider]  tretinoin (RETIN-A) 0.025 % cream SMARTSIG:Sparingly Topical Every Evening    [provider]  Turmeric 06-998 MG CAPS Take 500 mg by mouth daily.    [provider]  zoledronic  acid (RECLAST ) 5 MG/100ML SOLN injection  10/10/21   [provider]    Allergies  Allergen Reactions   Adhesive [Tape]    Bupropion    Hydrocodone-Ibuprofen    Latex Hives   Molds & Smuts    Oxycodone Itching   Tetracycline     REACTION: diarrhea   Tranxene [Clorazepate Dipotassium]     itche   Dust Mite Extract     And mold    Patient Active Problem List   Diagnosis Date Noted   Loss of transverse plantar arch 03/23/2022   ULCERATIVE PROCTITIS 05/05/2008   Insomnia 12/03/2007   COLONIC POLYPS 11/05/2007   Hypothyroidism 11/05/2007  Hyperlipidemia 11/05/2007   Osteoporosis 11/05/2007    Past Medical History:  Diagnosis Date   Acute peptic ulcer, unspecified site, with hemorrhage, without mention of obstruction    Allergy    Anxiety    Arthritis    COVID-19 09/08/2020   Disorder of bone and cartilage, unspecified    Fibromyalgia    past hx   Frozen shoulder    GERD (gastroesophageal reflux disease)    Hyperlipidemia    PT UNAWARE OF THIS FINDING   Hypertension    07/2018- pt states no HTN since losing over 70#   Hyperthyroidism 08/2013   Internal hemorrhoids    Obstructive sleep apnea (adult) (pediatric)    Osteopenia    Other specific disorder of sleep of nonorganic origin     PONV (postoperative nausea and vomiting)    Restless legs syndrome (RLS)    Sleep apnea    07/2018 pt states surgery to correct but states she still has sleep apnea, just not as bad   Tubular adenoma of colon    Ulcerative proctitis (HCC)    Unspecified hypothyroidism    Vaginitis 2018   Vitamin D  deficiency     Past Surgical History:  Procedure Laterality Date   ABDOMINAL HYSTERECTOMY     BREAST CYST ASPIRATION Right    CESAREAN SECTION     COLONOSCOPY N/A 05/25/2013   Procedure: COLONOSCOPY;  Surgeon: Pietro Bridegroom, MD;  Location: [facility];  Service: Endoscopy;  Laterality: N/A;   COLONOSCOPY     KNEE ARTHROSCOPY Right    x 2   LAPAROSCOPY     x 4   NASAL SEPTUM SURGERY     SIGMOIDOSCOPY     TONSILLECTOMY AND ADENOIDECTOMY     UPPER GASTROINTESTINAL ENDOSCOPY      Social History   Socioeconomic History   Marital status: Married    Spouse name: Not on file   Number of children: Not on file   Years of education: Not on file   Highest education level: Associate degree: occupational, Scientist, product/process development, or vocational program  Occupational History   Occupation: Manufacturing engineer  Tobacco Use   Smoking status: Former    Current packs/day: 0.00    Average packs/day: 0.1 packs/day for 15.0 years (1.5 ttl pk-yrs)    Types: Cigarettes    Start date: 02/26/1965    Quit date: 02/27/1980    Years since quitting: 43.4   Smokeless tobacco: Never  Vaping Use   Vaping status: Never Used  Substance and Sexual Activity   Alcohol use: No   Drug use: No   Sexual activity: Not on file  Other Topics Concern   Not on file  Social History Narrative   Not on file   Social Drivers of Health   Financial Resource Strain: Low Risk  (07/01/2023)   Overall Financial Resource Strain (CARDIA)    Difficulty of Paying Living Expenses: Not hard at all  Food Insecurity: No Food Insecurity (07/01/2023)   Hunger Vital Sign    Worried About Running Out of Food in the Last Year: Never true    Ran Out of  Food in the Last Year: Never true  Transportation Needs: No Transportation Needs (07/01/2023)   PRAPARE - Administrator, Civil Service (Medical): No    Lack of Transportation (Non-Medical): No  Physical Activity: Sufficiently Active (07/01/2023)   Exercise Vital Sign    Days of Exercise per Week: 3 days    Minutes of Exercise per Session: 60  min  Stress: Stress Concern Present (07/01/2023)   [employer] of Occupational Health - Occupational Stress Questionnaire    Feeling of Stress : To some extent  Social Connections: Socially Integrated (07/01/2023)   Social Connection and Isolation Panel [NHANES]    Frequency of Communication with Friends and Family: Three times a week    Frequency of Social Gatherings with Friends and Family: Patient declined    Attends Religious Services: More than 4 times per year    Active Member of [employer] or Organizations: Yes    Attends Banker Meetings: Patient declined    Marital Status: Married  Catering manager Violence: Not on file    Family History  Problem Relation Age of Onset   Kidney disease Mother    Diabetes Mother    Diabetes Father    Other Father        died from a brain bleed   Melanoma Sister    Other Sister        hx of PE's   Lung cancer Maternal Grandfather    Breast cancer Paternal Grandmother    Colon cancer Paternal Grandmother    Leukemia Paternal Grandfather    Colon cancer Other        grandmother   Breast cancer Other    Breast cancer Other        aunt   Other Daughter        PE while on birth control   Colon polyps Neg Hx    Esophageal cancer Neg Hx    Stomach cancer Neg Hx    Rectal cancer Neg Hx      Review of Systems  Constitutional: Negative.  Negative for chills and fever.  HENT: Negative.  Negative for congestion and sore throat.   Respiratory: Negative.  Negative for cough and shortness of breath.   Cardiovascular: Negative.  Negative for chest pain and palpitations.   Gastrointestinal:  Positive for diarrhea.  Genitourinary: Negative.  Negative for dysuria, frequency and urgency.  Skin: Negative.  Negative for rash.  Neurological:  Negative for dizziness and headaches.  All other systems reviewed and are negative.   Today's Vitals   07/31/23 1459  BP: 130/70  Pulse: 60  Temp: 98.1 F (36.7 C)  TempSrc: Oral  SpO2: 99%  Weight: 144 lb (65.3 kg)  Height: 5' 7.5" (1.715 m)   Body mass index is 22.22 kg/m.   Physical Exam Vitals reviewed.  Constitutional:      Appearance: Normal appearance.  HENT:     Head: Normocephalic.     Mouth/Throat:     Mouth: Mucous membranes are moist.     Pharynx: Oropharynx is clear.  Eyes:     Extraocular Movements: Extraocular movements intact.     Conjunctiva/sclera: Conjunctivae normal.     Pupils: Pupils are equal, round, and reactive to light.  Cardiovascular:     Rate and Rhythm: Normal rate and regular rhythm.     Pulses: Normal pulses.     Heart sounds: Normal heart sounds.  Pulmonary:     Effort: Pulmonary effort is normal.     Breath sounds: Normal breath sounds.  Abdominal:     Palpations: Abdomen is soft.     Tenderness: There is no abdominal tenderness.  Skin:    General: Skin is warm and dry.  Neurological:     General: No focal deficit present.     Mental Status: She is alert and oriented to person, place, and time.  Psychiatric:        Mood and Affect: Mood normal.        Behavior: Behavior normal.      ASSESSMENT & PLAN: A total of 44 minutes was spent with the patient and counseling/coordination of care regarding preparing for this visit, review of most recent office visit notes, review of multiple chronic medical conditions and their management, review of all medications, differential diagnosis of diarrhea and management, review of most recent bloodwork results, review of health maintenance items, education on nutrition, prognosis, documentation, and need for follow  up.   Problem List Items Addressed This Visit       Digestive   Diarrhea of presumed infectious origin - Primary   Clinically stable.  No red flag signs or symptoms. Benign abdominal examination.  Stable vital signs. No signs of significant dehydration.  Differential diagnosis discussed Multifactorial diarrhea.  Contributing, but not limited to, are antibiotic induced diarrhea, ulcerative colitis, and IBS. History of hypothyroidism.  Clinically euthyroid.  Acceptable recent TSH.  Continue Synthroid 150 mcg daily.  May consider decreasing dose but I will leave that to PCP. Recommend stool testing and blood work today. Hydration measures discussed Diet and nutrition discussed.  Recommend BRAT diet for 2 to 3 days and advance slowly as diarrhea improves. Colonoscopy report from 2023 reviewed.  Recent blood work from last May unremarkable results. Recommend to follow-up with PCP and GI doctor. ED precautions given. Advised to contact the office if no better or worse during the next several days.      Relevant Orders   GI Profile, Stool, PCR   Urinalysis   CBC with Differential/Platelet   Comprehensive metabolic panel with GFR     Other   History of ulcerative colitis   Relevant Orders   GI Profile, Stool, PCR   Urinalysis   CBC with Differential/Platelet   Comprehensive metabolic panel with GFR     Patient Instructions  Diarrhea, Adult Diarrhea is when you pass loose and sometimes watery poop (stool) often. Diarrhea can make you feel weak and cause you to lose water in your body (get dehydrated). Losing water in your body can cause you to: Feel tired and thirsty. Have a dry mouth. Go pee (urinate) less often. Diarrhea often lasts 2-3 days. It can last longer if it is a sign of something more serious. Be sure to treat your diarrhea as told by your doctor. Follow these instructions at home: Eating and drinking     Follow these instructions as told by your doctor: Take an  ORS (oral rehydration solution). This is a drink that helps you replace fluids and minerals your body lost. It is sold at pharmacies and stores. Drink enough fluid to keep your pee (urine) pale yellow. Drink fluids such as: Water. You can also get fluids by sucking on ice chips. Diluted fruit juice. Low-calorie sports drinks. Milk. Avoid drinking fluids that have a lot of sugar or caffeine in them. These include soda, energy drinks, and regular sports drinks. Avoid alcohol. Eat bland, easy-to-digest foods in small amounts as you are able. These foods include: Bananas. Applesauce. Rice. Low-fat (lean) meats. Toast. Crackers. Avoid spicy or fatty foods.  Medicines Take over-the-counter and prescription medicines only as told by your doctor. If you were prescribed antibiotics, take them as told by your doctor. Do not stop taking them even if you start to feel better. General instructions  Wash your hands often using soap and water for 20 seconds. If soap and  water are not available, use hand sanitizer. Others in your home should wash their hands as well. Wash your hands: After using the toilet or changing a diaper. Before preparing, cooking, or serving food. While caring for a sick person. While visiting someone in a hospital. Rest at home while you get better. Take a warm bath to help with any burning or pain from having diarrhea. Watch your condition for any changes. Contact a doctor if: You have a fever. Your diarrhea gets worse. You have new symptoms. You vomit every time you eat or drink. You feel light-headed, dizzy, or you have a headache. You have muscle cramps. You have signs of losing too much water in your body, such as: Dark pee, very little pee, or no pee. Cracked lips. Dry mouth. Sunken eyes. Sleepiness. Weakness. You have bloody or black poop or poop that looks like tar. You have very bad pain, cramping, or bloating in your belly (abdomen). Your skin feels  cold and clammy. You feel confused. Get help right away if: You have chest pain. Your heart is beating very quickly. You have trouble breathing or you are breathing very quickly. You feel very weak or you faint. These symptoms may be an emergency. Get help right away. Call 911. Do not wait to see if the symptoms will go away. Do not drive yourself to the hospital. This information is not intended to replace advice given to you by your health care provider. Make sure you discuss any questions you have with your health care provider. Document Revised: 08/01/2021 Document Reviewed: 08/01/2021 Elsevier Patient Education  2024 Elsevier Inc.    Maryagnes Small, MD [facility]

## 2023-07-31 NOTE — Telephone Encounter (Signed)
 FYI Only or Action Required?: FYI only for provider  Patient was last seen in primary care on 07/05/2023 by Adelia Homestead, MD. Called Nurse Triage reporting Diarrhea. Symptoms began a week ago. Interventions attempted: Rest, hydration, or home remedies. Symptoms are: gradually improving.  Triage Disposition: See PCP When Office is Open (Within 3 Days)  Patient/caregiver understands and will follow disposition?: Yes, will follow disposition Reason for Disposition  Diarrhea continues for > 3 days after stopping the antibiotic  Answer Assessment - Initial Assessment Questions 1. ANTIBIOTIC: "What antibiotic are you taking?" "How many times per day?"     3 different abx since April  2. ANTIBIOTIC ONSET: "When was the antibiotic started?"     April 3. DIARRHEA SEVERITY: "How bad is the diarrhea?" "How many more stools have you had in the past 24 hours than normal?"    - NO DIARRHEA (SCALE 0)   - MILD (SCALE 1-3): Few loose or mushy BMs; increase of 1-3 stools over normal daily number of stools; mild increase in ostomy output.   -  MODERATE (SCALE 4-7): Increase of 4-6 stools daily over normal; moderate increase in ostomy output. * SEVERE (SCALE 8-10; OR 'WORST POSSIBLE'): Increase of 7 or more stools daily over normal; moderate increase in ostomy output; incontinence.     0 4. ONSET: "When did the diarrhea begin?"      5/26 5. BM CONSISTENCY: "How loose or watery is the diarrhea?"      Formed, soft 6. VOMITING: "Are you also vomiting?" If Yes, ask: "How many times in the past 24 hours?"      0 7. ABDOMEN PAIN: "Are you having any abdomen pain?" If Yes, ask: "What does it feel like?" (e.g., crampy, dull, intermittent, constant)      denies 8. ABDOMEN PAIN SEVERITY: If present, ask: "How bad is the pain?"  (e.g., Scale 1-10; mild, moderate, or severe)   - MILD (1-3): doesn't interfere with normal activities, abdomen soft and not tender to touch    - MODERATE (4-7): interferes with  normal activities or awakens from sleep, abdomen tender to touch    - SEVERE (8-10): excruciating pain, doubled over, unable to do any normal activities       denies 9. ORAL INTAKE: If vomiting, "Have you been able to drink liquids?" "How much liquids have you had in the past 24 hours?"     Yes, drinking fluids 10. HYDRATION: "Any signs of dehydration?" (e.g., dry mouth [not just dry lips], too weak to stand, dizziness, new weight loss) "When did you last urinate?"       Mild lightheadedness when pt stands. Denies dry mouth 11. EXPOSURE: "Have you traveled to a foreign country recently?" "Have you been exposed to anyone with diarrhea?" "Could you have eaten any food that was spoiled?"       denies 12. OTHER SYMPTOMS: "Do you have any other symptoms?" (e.g., fever, blood in stool)       denies Pt states that she has been on 3 different abx recently, states that she is having dry eyes, no dry mouth.  Protocols used: Diarrhea on Antibiotics-A-AH

## 2023-07-31 NOTE — Assessment & Plan Note (Addendum)
 Clinically stable.  No red flag signs or symptoms. Benign abdominal examination.  Stable vital signs. No signs of significant dehydration.  Differential diagnosis discussed Multifactorial diarrhea.  Contributing, but not limited to, are antibiotic induced diarrhea, ulcerative colitis, and IBS. History of hypothyroidism.  Clinically euthyroid.  Acceptable recent TSH.  Continue Synthroid 150 mcg daily.  May consider decreasing dose but I will leave that to PCP. Recommend stool testing and blood work today. Hydration measures discussed Diet and nutrition discussed.  Recommend BRAT diet for 2 to 3 days and advance slowly as diarrhea improves. Colonoscopy report from 2023 reviewed.  Recent blood work from last May unremarkable results. Recommend to follow-up with PCP and GI doctor. ED precautions given. Advised to contact the office if no better or worse during the next several days.

## 2023-08-01 ENCOUNTER — Ambulatory Visit: Payer: Self-pay | Admitting: Emergency Medicine

## 2023-08-01 ENCOUNTER — Telehealth: Payer: Self-pay | Admitting: Internal Medicine

## 2023-08-01 NOTE — Telephone Encounter (Unsigned)
 Copied from CRM [id number]. Topic: Clinical - Lab/Test Results >> Aug 01, 2023  3:25 PM Clyde Darling P wrote: Reason for CRM: PT would like lab results discussed , she can be reached [phone]

## 2023-08-02 LAB — GI PROFILE, STOOL, PCR

## 2023-08-05 ENCOUNTER — Telehealth (INDEPENDENT_AMBULATORY_CARE_PROVIDER_SITE_OTHER): Admitting: Family Medicine

## 2023-08-05 DIAGNOSIS — R630 Anorexia: Secondary | ICD-10-CM

## 2023-08-05 DIAGNOSIS — R519 Headache, unspecified: Secondary | ICD-10-CM | POA: Diagnosis not present

## 2023-08-05 DIAGNOSIS — E871 Hypo-osmolality and hyponatremia: Secondary | ICD-10-CM | POA: Diagnosis not present

## 2023-08-05 DIAGNOSIS — R002 Palpitations: Secondary | ICD-10-CM

## 2023-08-05 DIAGNOSIS — R5383 Other fatigue: Secondary | ICD-10-CM

## 2023-08-05 NOTE — Progress Notes (Unsigned)
 Virtual Visit via Video Note  I connected with Christy [name] on 08/06/23 at 11:00 AM EDT by a video enabled telemedicine application and verified that I am speaking with the correct person using two identifiers.  Patient Location: Home Provider Location: Office/Clinic  I discussed the limitations, risks, security, and privacy concerns of performing an evaluation and management service by video and the availability of in person appointments. I also discussed with the patient that there may be a patient responsible charge related to this service. The patient expressed understanding and agreed to proceed.  Subjective: PCP: Adelia Homestead, MD  Chief Complaint  Patient presents with   Medical Management of Chronic Issues    Follow up on dirreha( pt no longer expernicing this), pt stated she is having heaches everyday, loss of appetite and also fatigue and lethargic   HPI 68 year old female presents for A/V visit for evaluation of continued fatigue, headaches after having diarrhea for about a week. She was seen in this office on 07/31/2023 and was found to be significantly hyponatremic.  Stool profile was negative. Reports she still has a decreased appetite, she is making herself eat. States she is drinking liquid IV and coconut water, every now and then a caffeine free diet Pepsi, 1 cup of coffee a day. She is recently increased Premarin to 3 times a week. Reports right cataract will be repaired in 3 days. Endorses palpitations and racing heart at times. Inquiring about what can be done so that she can feel better.  ROS: Per HPI  Current Outpatient Medications:    ALPRAZolam (XANAX) 1 MG tablet, Take by mouth. 1 and 1/2 tablet, Disp: , Rfl:    cholecalciferol (VITAMIN D ) 400 units TABS tablet, Take 400 Units by mouth., Disp: , Rfl:    dicyclomine  (BENTYL ) 10 MG capsule, Take 10 mg by mouth as needed for spasms., Disp: , Rfl:    famotidine  (PEPCID ) 20 MG tablet, Take 1 tablet (20  mg total) by mouth at bedtime., Disp: 90 tablet, Rfl: 3   loratadine (CLARITIN) 10 MG tablet, Take 10 mg by mouth., Disp: , Rfl:    meclizine  (ANTIVERT ) 12.5 MG tablet, TAKE 1 TABLET BY MOUTH 3 TIMES A DAY AS NEEDED FOR DIZZINESS, Disp: 30 tablet, Rfl: 1   mesalamine  (APRISO ) 0.375 g 24 hr capsule, Take 2 capsules (0.75 g total) by mouth daily., Disp: 180 capsule, Rfl: 3   omeprazole  (PRILOSEC  OTC) 20 MG tablet, TAKE 1 TABLET BY MOUTH EVERY DAY, Disp: 84 tablet, Rfl: 2   ondansetron  (ZOFRAN ) 4 MG tablet, Take 1 tablet (4 mg total) by mouth 2 (two) times daily as needed for nausea or vomiting., Disp: 60 tablet, Rfl: 2   PREMARIN vaginal cream, Place 0.5 g vaginally 2 (two) times a week., Disp: , Rfl:    Probiotic Product (ALIGN) 4 MG CAPS, Take by mouth., Disp: , Rfl:    SYNTHROID 150 MCG tablet, Take 150 mcg by mouth daily., Disp: , Rfl: 9   tretinoin (RETIN-A) 0.025 % cream, SMARTSIG:Sparingly Topical Every Evening, Disp: , Rfl:    Turmeric 06-998 MG CAPS, Take 500 mg by mouth daily., Disp: , Rfl:    zoledronic  acid (RECLAST ) 5 MG/100ML SOLN injection, , Disp: , Rfl:   Observations/Objective: There were no vitals filed for this visit. Physical Exam Vitals and nursing note reviewed.  Constitutional:      General: She is not in acute distress. HENT:     Head: Normocephalic and atraumatic.  Eyes:  Extraocular Movements: Extraocular movements intact.  Pulmonary:     Effort: Pulmonary effort is normal.  Musculoskeletal:     Cervical back: Normal range of motion.  Neurological:     General: No focal deficit present.     Mental Status: She is alert and oriented to person, place, and time.  Psychiatric:        Mood and Affect: Mood normal.        Behavior: Behavior normal.     Assessment and Plan: Hyponatremia Assessment & Plan: Future CMP ordered. May trial salt tablets, especially before exercise Discussed that drinking fluids with electrolytes is incredibly  important  Orders: -     Comprehensive metabolic panel with GFR; Future  Other fatigue Assessment & Plan: Postviral syndrome versus hyponatremia CMP ordered  Orders: -     Comprehensive metabolic panel with GFR; Future  Nonintractable episodic headache, unspecified headache type Assessment & Plan: Could be related to cataract/vision or hyponatremia CMP ordered   Decreased appetite Assessment & Plan: Continue to slowly increase the foods that you are eating May continue brat diet as long as needed Push fluids   Palpitations Assessment & Plan: Could be electrolyte related CMP ordered Will do EKG at next visit   Discussed that if hyponatremia persists, following up with endocrinology would be the next step.  Discussed that if she becomes confused, lightheaded, having any tremors, more diarrhea or vomiting, she should proceed to the ER for further evaluation and hyponatremic workup.  Follow Up Instructions: No follow-ups on file.   I discussed the assessment and treatment plan with the patient. The patient was provided an opportunity to ask questions, and all were answered. The patient agreed with the plan and demonstrated an understanding of the instructions.   The patient was advised to call back or seek an in-person evaluation if the symptoms worsen or if the condition fails to improve as anticipated.  I spent 25 minutes on this patient encounter including counseling, diagnosis, treatment options, documentation, face-to-face time.  The above assessment and management plan was discussed with the patient. The patient verbalized understanding of and has agreed to the management plan.   Wellington Half, FNP

## 2023-08-06 ENCOUNTER — Other Ambulatory Visit: Payer: Self-pay

## 2023-08-06 ENCOUNTER — Emergency Department (HOSPITAL_BASED_OUTPATIENT_CLINIC_OR_DEPARTMENT_OTHER)
Admission: EM | Admit: 2023-08-06 | Discharge: 2023-08-06 | Disposition: A | Discharge status: Home / Self Care | Attending: Emergency Medicine | Admitting: Emergency Medicine

## 2023-08-06 ENCOUNTER — Encounter (HOSPITAL_BASED_OUTPATIENT_CLINIC_OR_DEPARTMENT_OTHER): Payer: Self-pay | Admitting: Emergency Medicine

## 2023-08-06 ENCOUNTER — Encounter: Payer: Self-pay | Admitting: Family Medicine

## 2023-08-06 DIAGNOSIS — R519 Headache, unspecified: Secondary | ICD-10-CM | POA: Insufficient documentation

## 2023-08-06 DIAGNOSIS — Z91040 Latex allergy status: Secondary | ICD-10-CM | POA: Diagnosis not present

## 2023-08-06 DIAGNOSIS — R197 Diarrhea, unspecified: Secondary | ICD-10-CM | POA: Insufficient documentation

## 2023-08-06 DIAGNOSIS — E871 Hypo-osmolality and hyponatremia: Secondary | ICD-10-CM | POA: Insufficient documentation

## 2023-08-06 DIAGNOSIS — R630 Anorexia: Secondary | ICD-10-CM | POA: Insufficient documentation

## 2023-08-06 DIAGNOSIS — R42 Dizziness and giddiness: Secondary | ICD-10-CM | POA: Insufficient documentation

## 2023-08-06 DIAGNOSIS — R002 Palpitations: Secondary | ICD-10-CM | POA: Insufficient documentation

## 2023-08-06 DIAGNOSIS — R5383 Other fatigue: Secondary | ICD-10-CM | POA: Insufficient documentation

## 2023-08-06 LAB — CBC
HCT: 43.3 % (ref 36.0–46.0)
Hemoglobin: 13.9 g/dL (ref 12.0–15.0)
MCH: 30.5 pg (ref 26.0–34.0)
MCHC: 32.1 g/dL (ref 30.0–36.0)
MCV: 95.2 fL (ref 80.0–100.0)
Platelets: 196 10*3/uL (ref 150–400)
RBC: 4.55 MIL/uL (ref 3.87–5.11)
RDW: 14.4 % (ref 11.5–15.5)
WBC: 8 10*3/uL (ref 4.0–10.5)
nRBC: 0 % (ref 0.0–0.2)

## 2023-08-06 LAB — COMPREHENSIVE METABOLIC PANEL WITH GFR
ALT: 17 U/L (ref 0–44)
AST: 29 U/L (ref 15–41)
Albumin: 4.6 g/dL (ref 3.5–5.0)
Alkaline Phosphatase: 40 U/L (ref 38–126)
Anion gap: 13 (ref 5–15)
BUN: 16 mg/dL (ref 8–23)
CO2: 26 mmol/L (ref 22–32)
Calcium: 10.6 mg/dL — ABNORMAL HIGH (ref 8.9–10.3)
Chloride: 97 mmol/L — ABNORMAL LOW (ref 98–111)
Creatinine, Ser: 0.97 mg/dL (ref 0.44–1.00)
GFR, Estimated: >60 mL/min (ref >60)
Glucose, Bld: 89 mg/dL (ref 70–99)
Potassium: 5 mmol/L (ref 3.5–5.1)
Sodium: 136 mmol/L (ref 135–145)
Total Bilirubin: 0.5 mg/dL (ref 0.0–1.2)
Total Protein: 7.8 g/dL (ref 6.5–8.1)

## 2023-08-06 LAB — URINALYSIS, ROUTINE W REFLEX MICROSCOPIC
Bacteria, UA: NONE SEEN
Bilirubin Urine: NEGATIVE
Glucose, UA: NEGATIVE mg/dL
Hgb urine dipstick: NEGATIVE
Ketones, ur: NEGATIVE mg/dL
Leukocytes,Ua: NEGATIVE
Nitrite: NEGATIVE
Protein, ur: NEGATIVE mg/dL
Specific Gravity, Urine: 1.012 (ref 1.005–1.030)
pH: 5 (ref 5.0–8.0)

## 2023-08-06 LAB — CBG MONITORING, ED: Glucose-Capillary: 80 mg/dL (ref 70–99)

## 2023-08-06 MED ORDER — SODIUM CHLORIDE 0.9 % IV BOLUS
1000.0000 mL | Freq: Once | INTRAVENOUS | Status: AC
  Administered 2023-08-06: 1000 mL via INTRAVENOUS

## 2023-08-06 MED ORDER — ONDANSETRON HCL 4 MG/2ML IJ SOLN
4.0000 mg | Freq: Once | INTRAMUSCULAR | Status: AC
  Administered 2023-08-06: 4 mg via INTRAVENOUS
  Filled 2023-08-06: qty 2

## 2023-08-06 NOTE — Assessment & Plan Note (Signed)
 Continue to slowly increase the foods that you are eating May continue brat diet as long as needed Push fluids

## 2023-08-06 NOTE — ED Provider Notes (Signed)
 [place] EMERGENCY DEPARTMENT AT [facility] Provider Note   CSN: [id number] Arrival date & time: 08/06/23  1442     History  No chief complaint on file.   Christy [name] is a 68 y.o. female.  Patient to ED with symptoms that started May 26th with copious diarrhea without pain, bloody stools, nausea vomiting or fever. She has a history of Ulcerative Colitis with typical symptoms being constipation. Later she developed lightheadedness/dizziness, resolution of the diarrhea. The dizziness is persistent with diarrhea that restarted today. She reports multiple antibiotic prescriptions since the end of April. Seen by primary care x 2 where she reports a negative test for C. Diff and low sodium this past week of 128. She has been hydrating with lots of water and urinating frequently without dysuria.   The history is provided by the patient. No language interpreter was used.       Home Medications Prior to Admission medications   Medication Sig Start Date End Date Taking? Authorizing Provider  ALPRAZolam (XANAX) 1 MG tablet Take by mouth. 1 and 1/2 tablet 10/24/20   [provider]  cholecalciferol (VITAMIN D ) 400 units TABS tablet Take 400 Units by mouth.    [provider]  dicyclomine  (BENTYL ) 10 MG capsule Take 10 mg by mouth as needed for spasms.    [provider]  famotidine  (PEPCID ) 20 MG tablet Take 1 tablet (20 mg total) by mouth at bedtime. 01/30/23   Nandigam, Kavitha V, MD  loratadine (CLARITIN) 10 MG tablet Take 10 mg by mouth.    [provider]  meclizine  (ANTIVERT ) 12.5 MG tablet TAKE 1 TABLET BY MOUTH 3 TIMES A DAY AS NEEDED FOR DIZZINESS 01/04/23   Adelia Homestead, MD  mesalamine  (APRISO ) 0.375 g 24 hr capsule Take 2 capsules (0.75 g total) by mouth daily. 01/30/23   Nandigam, Kavitha V, MD  omeprazole  (PRILOSEC  OTC) 20 MG tablet TAKE 1 TABLET BY MOUTH EVERY DAY 01/30/23   Nandigam, Kavitha V, MD  ondansetron  (ZOFRAN ) 4 MG  tablet Take 1 tablet (4 mg total) by mouth 2 (two) times daily as needed for nausea or vomiting. 10/20/20   Esterwood, Amy S, PA-C  PREMARIN vaginal cream Place 0.5 g vaginally 2 (two) times a week. 06/05/18   [provider]  Probiotic Product (ALIGN) 4 MG CAPS Take by mouth.    [provider]  SYNTHROID 150 MCG tablet Take 150 mcg by mouth daily. 03/17/14   [provider]  tretinoin (RETIN-A) 0.025 % cream SMARTSIG:Sparingly Topical Every Evening    [provider]  Turmeric 06-998 MG CAPS Take 500 mg by mouth daily.    [provider]  zoledronic  acid (RECLAST ) 5 MG/100ML SOLN injection  10/10/21   [provider]      Allergies    Adhesive [tape], Bupropion, Hydrocodone, Hydrocodone-ibuprofen, Latex, Molds & smuts, Oxycodone, Tetracycline, Tranxene [clorazepate dipotassium], and Dust mite extract    Review of Systems   Review of Systems  Physical Exam Updated Vital Signs BP 128/65 (BP Location: Right Arm)   Pulse (!) 59   Temp 97.9 F (36.6 C)   Resp 18   SpO2 100%  Physical Exam Constitutional:      Appearance: She is well-developed.  HENT:     Head: Normocephalic.  Cardiovascular:     Rate and Rhythm: Normal rate and regular rhythm.     Heart sounds: No murmur heard. Pulmonary:     Effort: Pulmonary effort is normal.  Breath sounds: Normal breath sounds. No wheezing, rhonchi or rales.  Abdominal:     General: Bowel sounds are normal.     Palpations: Abdomen is soft.     Tenderness: There is no abdominal tenderness. There is no guarding or rebound.  Musculoskeletal:        General: Normal range of motion.     Cervical back: Normal range of motion and neck supple.  Skin:    General: Skin is warm and dry.  Neurological:     General: No focal deficit present.     Mental Status: She is alert and oriented to person, place, and time.     GCS: GCS eye subscore is 4. GCS verbal subscore is 5. GCS motor subscore is 6.      Cranial Nerves: Cranial nerves 2-12 are intact. No dysarthria or facial asymmetry.     Sensory: Sensation is intact.     Motor: No weakness or pronator drift.     Coordination: Finger-Nose-Finger Test normal.     Gait: Gait normal.     ED Results / Procedures / Treatments   Labs (all labs ordered are listed, but only abnormal results are displayed) Labs Reviewed  COMPREHENSIVE METABOLIC PANEL WITH GFR - Abnormal; Notable for the following components:      Result Value   Chloride 97 (*)    Calcium 10.6 (*)    All other components within normal limits  URINALYSIS, ROUTINE W REFLEX MICROSCOPIC - Abnormal; Notable for the following components:   Color, Urine COLORLESS (*)    All other components within normal limits  CBC  CBG MONITORING, ED    EKG EKG Interpretation Date/Time:  Tuesday August 06 2023 14:50:16 EDT Ventricular Rate:  61 PR Interval:  89 QRS Duration:  87 QT Interval:  414 QTC Calculation: 417 R Axis:   85  Text Interpretation: Sinus rhythm Short PR interval Consider RVH w/ secondary repol abnormality No previous ECGs available Confirmed by Scarlette Currier ([id number]) on 08/06/2023 6:52:18 PM  Radiology No results found.  Procedures Procedures    Medications Ordered in ED Medications  sodium chloride  0.9 % bolus 1,000 mL (1,000 mLs Intravenous New Bag/Given 08/06/23 1740)  ondansetron  (ZOFRAN ) injection 4 mg (4 mg Intravenous Given 08/06/23 1738)    ED Course/ Medical Decision Making/ A&P Clinical Course as of 08/06/23 1912  Tue Aug 06, 2023  1901 Patient to ED with lightheadedness, diarrhea. No syncope. No chest pain, palpitations. Exam is reassuring. Labs unremarkable. Urine is not concentrated, however, 1 liter of fluid provided after which the patient feels better. Neurologic exam unremarkable as well. She is felt stable for discharge home, PCP follow up.  [SU]    Clinical Course User Index [SU] Mandy Second, PA-C                                  Medical Decision Making Amount and/or Complexity of Data Reviewed Labs: ordered.  Risk Prescription drug management.           Final Clinical Impression(s) / ED Diagnoses Final diagnoses:  Lightheadedness    Rx / DC Orders ED Discharge Orders     None         Mandy Second, PA-C 08/06/23 1912    Scarlette Currier, MD 08/09/23 2316

## 2023-08-06 NOTE — Assessment & Plan Note (Signed)
 Could be related to cataract/vision or hyponatremia CMP ordered

## 2023-08-06 NOTE — ED Triage Notes (Addendum)
 Dizziness x several weeks. Saw PCP last week-advised to hydrate more with electrolytes-sodium was low. Endorses brain fog today. HX hypothyroid. Denies CP SOB. Endorses diarrhea and polyuria.

## 2023-08-06 NOTE — Assessment & Plan Note (Signed)
 Could be electrolyte related CMP ordered Will do EKG at next visit

## 2023-08-06 NOTE — Discharge Instructions (Signed)
 As we discussed, your labs and exam are reassuring here. You can be discharged home and should plan to see your primary care physician for recheck in 2-3 days if symptoms of lightheadedness/dizziness continue.   Return to the ED with any new or worsening symptoms.

## 2023-08-06 NOTE — Assessment & Plan Note (Signed)
 Postviral syndrome versus hyponatremia CMP ordered

## 2023-08-06 NOTE — Assessment & Plan Note (Signed)
 Future CMP ordered. May trial salt tablets, especially before exercise Discussed that drinking fluids with electrolytes is incredibly important

## 2023-08-07 ENCOUNTER — Telehealth: Payer: Self-pay | Admitting: Gastroenterology

## 2023-08-07 NOTE — Telephone Encounter (Signed)
 Patient states that since [facility] day she has had a combination of constipation and diarrhea. She reports that prior to that she had taken 3 different types of antibiotics. She states that she felt that she was really dehydrated so last Wednesday she had an appointment and they told her that she was overhydrated. Her sodium low. They told her to drink water with electrolytes in it. On Monday she saw another provider who told her to continue with the electrolytes and if symptoms got worse to go to the ER. Other symptoms include HA x6 days, lightheadedness and abd cramping. She reports that she took a Dicyclomine  yesterday and didn't realize that the side effects included lightheadedness and drowsiness. Patient states she went to the ER with these same symptoms yesterday. She is still reporting right sided abdominal burning and cramping however she doesn't want to take another Dicyclomine  because of how she felt yesterday. She reports x3 episodes of diarrhea yesterday, but is reporting that today she is constipated.

## 2023-08-07 NOTE — Telephone Encounter (Signed)
 Inbound call from patient, states she would like to speak to a nurse. She states she has been having issues since memorial day. She states she has been having diarrhea, she stated she went to her PCP and they told her she was over hydrated and flushed out her electrolytes. She states her GI system is messed up she has not been taking her Miralax but once she stopped she got constipated and has been experiencing abdominal cramping and has been drinking half a tablespoon of Miralax. She states she had to go to the ED last night, but has seen no relief. States she is unsure of how to take dicyclomine  because it is causing drowsiness. She states she will not be available tomorrow due to cataract surgery, would like someone to call her today.

## 2023-08-09 ENCOUNTER — Telehealth (INDEPENDENT_AMBULATORY_CARE_PROVIDER_SITE_OTHER): Admitting: Family Medicine

## 2023-08-09 DIAGNOSIS — R002 Palpitations: Secondary | ICD-10-CM

## 2023-08-09 DIAGNOSIS — E039 Hypothyroidism, unspecified: Secondary | ICD-10-CM

## 2023-08-09 DIAGNOSIS — R5383 Other fatigue: Secondary | ICD-10-CM | POA: Diagnosis not present

## 2023-08-09 DIAGNOSIS — E871 Hypo-osmolality and hyponatremia: Secondary | ICD-10-CM

## 2023-08-09 NOTE — Progress Notes (Unsigned)
 Virtual Visit via Video Note  I connected with Christy [name] on 08/10/23 at  4:00 PM EDT by a video enabled telemedicine application and verified that I am speaking with the correct person using two identifiers.  Patient Location: Home Provider Location: Office/Clinic  I discussed the limitations, risks, security, and privacy concerns of performing an evaluation and management service by video and the availability of in person appointments. I also discussed with the patient that there may be a patient responsible charge related to this service. The patient expressed understanding and agreed to proceed.  Subjective: PCP: Adelia Homestead, MD  No chief complaint on file.  HPI 68 year old female presents for A/V visit for evaluation after being seen in the emergency department on 08/06/2023. Had seen me previously for follow-up of diarrhea and hyponatremia. Was having symptoms of tremors, palpitations, headache, dizziness, another episode of diarrhea. Was treated with IV fluids and Zofran  in the ER.  Reports that she still has a mild nagging headache. Of note, last TSH was 0.10, there was no change in levothyroxine dose.  She was not symptomatic at the time. Has not had another TSH drawn since 07/05/2023. Denies other concerns today. Of note, had cataract surgery yesterday on the right eye, will have the left eye done in 2 weeks.  ROS: Per HPI  Current Outpatient Medications:    ALPRAZolam (XANAX) 1 MG tablet, Take by mouth. 1 and 1/2 tablet, Disp: , Rfl:    cholecalciferol (VITAMIN D ) 400 units TABS tablet, Take 400 Units by mouth., Disp: , Rfl:    dicyclomine  (BENTYL ) 10 MG capsule, Take 10 mg by mouth as needed for spasms., Disp: , Rfl:    famotidine  (PEPCID ) 20 MG tablet, Take 1 tablet (20 mg total) by mouth at bedtime., Disp: 90 tablet, Rfl: 3   loratadine (CLARITIN) 10 MG tablet, Take 10 mg by mouth., Disp: , Rfl:    meclizine  (ANTIVERT ) 12.5 MG tablet, TAKE 1 TABLET BY  MOUTH 3 TIMES A DAY AS NEEDED FOR DIZZINESS, Disp: 30 tablet, Rfl: 1   mesalamine  (APRISO ) 0.375 g 24 hr capsule, Take 2 capsules (0.75 g total) by mouth daily., Disp: 180 capsule, Rfl: 3   omeprazole  (PRILOSEC  OTC) 20 MG tablet, TAKE 1 TABLET BY MOUTH EVERY DAY, Disp: 84 tablet, Rfl: 2   ondansetron  (ZOFRAN ) 4 MG tablet, Take 1 tablet (4 mg total) by mouth 2 (two) times daily as needed for nausea or vomiting., Disp: 60 tablet, Rfl: 2   PREMARIN vaginal cream, Place 0.5 g vaginally 2 (two) times a week., Disp: , Rfl:    Probiotic Product (ALIGN) 4 MG CAPS, Take by mouth., Disp: , Rfl:    SYNTHROID 150 MCG tablet, Take 150 mcg by mouth daily., Disp: , Rfl: 9   tretinoin (RETIN-A) 0.025 % cream, SMARTSIG:Sparingly Topical Every Evening, Disp: , Rfl:    Turmeric 06-998 MG CAPS, Take 500 mg by mouth daily., Disp: , Rfl:    zoledronic  acid (RECLAST ) 5 MG/100ML SOLN injection, , Disp: , Rfl:   Observations/Objective: There were no vitals filed for this visit. Physical Exam Vitals and nursing note reviewed.  Constitutional:      General: She is not in acute distress. HENT:     Head: Normocephalic and atraumatic.   Eyes:     Extraocular Movements: Extraocular movements intact.   Pulmonary:     Effort: Pulmonary effort is normal.   Musculoskeletal:     Cervical back: Normal range of motion.   Neurological:  General: No focal deficit present.     Mental Status: She is alert and oriented to person, place, and time.   Psychiatric:        Mood and Affect: Mood normal.        Behavior: Behavior normal.     Assessment and Plan: Hyponatremia Assessment & Plan: BMP ordered Continue liquid IV Continue current medication regimen  Orders: -     Basic metabolic panel with GFR; Future  Other fatigue Assessment & Plan: Continue liquid IV BM P, TSH ordered today  Orders: -     Basic metabolic panel with GFR; Future  Palpitations Assessment & Plan: BMP, TSH ordered  today Continue liquid IV and maintain hydration   Orders: -     Basic metabolic panel with GFR; Future -     TSH; Future  Hypothyroidism, unspecified type Assessment & Plan: TSH ordered, may need to decrease levothyroxine Symptoms possibly related to low TSH versus hyponatremia from dehydration  Orders: -     TSH; Future    Follow Up Instructions: Return if symptoms worsen or fail to improve, for PCP as scheduled.   I discussed the assessment and treatment plan with the patient. The patient was provided an opportunity to ask questions, and all were answered. The patient agreed with the plan and demonstrated an understanding of the instructions.   The patient was advised to call back or seek an in-person evaluation if the symptoms worsen or if the condition fails to improve as anticipated.  I spent 25 minutes on this patient encounter including counseling, diagnosis, treatment options, documentation, face-to-face time.  The above assessment and management plan was discussed with the patient. The patient verbalized understanding of and has agreed to the management plan.   Wellington Half, FNP

## 2023-08-09 NOTE — Telephone Encounter (Signed)
 Inbound call from patient making us  aware she is feeling a lot better. States she has tried the previous recommendations as well. Also states she would like to hold off on a dose change for mesalamine . Please advise.   Thank you

## 2023-08-10 ENCOUNTER — Encounter: Payer: Self-pay | Admitting: Family Medicine

## 2023-08-10 NOTE — Assessment & Plan Note (Signed)
 BMP ordered Continue liquid IV Continue current medication regimen

## 2023-08-10 NOTE — Assessment & Plan Note (Signed)
 TSH ordered, may need to decrease levothyroxine Symptoms possibly related to low TSH versus hyponatremia from dehydration

## 2023-08-10 NOTE — Assessment & Plan Note (Signed)
 BMP, TSH ordered today Continue liquid IV and maintain hydration

## 2023-08-10 NOTE — Assessment & Plan Note (Signed)
 Continue liquid IV BM P, TSH ordered today

## 2023-08-13 ENCOUNTER — Telehealth: Payer: Self-pay

## 2023-08-13 NOTE — Telephone Encounter (Signed)
**Note De-identified  [place] Obfuscation** Please advise 

## 2023-08-13 NOTE — Telephone Encounter (Signed)
 Copied from CRM [id number]. Topic: Clinical - Request for Lab/Test Order >> Aug 12, 2023  4:18 PM [place] E wrote: Reason for CRM: Patient called stating that her endocrinologist is recommending her to have a free T4 lab done as well as cortisol blood work. Callback number for patient is [phone].

## 2023-08-13 NOTE — Telephone Encounter (Signed)
 I have scheduled patient with Dr burns as she was insistence to be seen sooner

## 2023-08-15 ENCOUNTER — Encounter: Payer: Self-pay | Admitting: Internal Medicine

## 2023-08-15 NOTE — Progress Notes (Signed)
 Subjective:    Patient ID: Christy [name], female    DOB: 08/08/1955, 68 y.o.   MRN: [id number]     HPI Christy [name] is here for follow up from the hospital   6/4 - had been having lots of diarrhea.  Na was low.    Admitted 6/10 - 6/13  Presented with copious diarrhea w/o pain, bloody stools, N/V and fever.  Has UC and typically has constipation with that.  She later developed lightheadedness/dizziness.  Diarrhea resolved.  Dizziness was persistent and then diarrhea restarted the day of admission.  She had multiple abx prescriptions since end of April.  Tested negative for cdiff by us  twice.  Sodium was 128 the week prior, but was normal in the ED.  She was hydrating well and urinating frequently w/o dysuria.  In ED received IVF and felt better.  Has not had any diarrhea since.    Saw stephanie 6/13 - VV  Sees Dr Nichole for endocrine.    Yesterday afternoon started feeling better.  She still feels a little sluggish.  Last diarrhea was 6/10.  She is drinking drinks with electrolytes.  She had tsh, FT4 and cortisol done by endo - normal.  FT4 was upper level of normal.   Medications and allergies reviewed with patient and updated if appropriate.  Current Outpatient Medications on File Prior to Visit  Medication Sig Dispense Refill   ALPRAZolam (XANAX) 1 MG tablet Take by mouth. 1 and 1/2 tablet     cholecalciferol (VITAMIN D ) 400 units TABS tablet Take 400 Units by mouth.     dicyclomine  (BENTYL ) 10 MG capsule Take 10 mg by mouth as needed for spasms.     famotidine  (PEPCID ) 20 MG tablet Take 1 tablet (20 mg total) by mouth at bedtime. 90 tablet 3   loratadine (CLARITIN) 10 MG tablet Take 10 mg by mouth.     meclizine  (ANTIVERT ) 12.5 MG tablet TAKE 1 TABLET BY MOUTH 3 TIMES A DAY AS NEEDED FOR DIZZINESS 30 tablet 1   mesalamine  (APRISO ) 0.375 g 24 hr capsule Take 2 capsules (0.75 g total) by mouth daily. 180 capsule 3   omeprazole  (PRILOSEC  OTC) 20 MG tablet TAKE 1 TABLET BY MOUTH  EVERY DAY 84 tablet 2   ondansetron  (ZOFRAN ) 4 MG tablet Take 1 tablet (4 mg total) by mouth 2 (two) times daily as needed for nausea or vomiting. 60 tablet 2   PREMARIN vaginal cream Place 0.5 g vaginally 2 (two) times a week.     Probiotic Product (ALIGN) 4 MG CAPS Take by mouth.     SYNTHROID 150 MCG tablet Take 150 mcg by mouth daily.  9   tretinoin (RETIN-A) 0.025 % cream SMARTSIG:Sparingly Topical Every Evening     Turmeric 06-998 MG CAPS Take 500 mg by mouth daily.     zoledronic  acid (RECLAST ) 5 MG/100ML SOLN injection      No current facility-administered medications on file prior to visit.     Review of Systems  Constitutional:  Negative for appetite change (almost normal), chills and fever.  Gastrointestinal:  Negative for abdominal pain, diarrhea and nausea.       No gerd  Neurological:  Negative for dizziness, light-headedness and headaches.       Objective:   Vitals:   08/16/23 1304  BP: 110/78  Pulse: 67  Temp: 98.7 F (37.1 C)  SpO2: 99%   BP Readings from Last 3 Encounters:  08/16/23 110/78  08/06/23 128/65  07/31/23 130/70   Wt Readings from Last 3 Encounters:  08/16/23 139 lb (63 kg)  07/31/23 144 lb (65.3 kg)  07/05/23 140 lb 3.2 oz (63.6 kg)   Body mass index is 21.45 kg/m.    Physical Exam Constitutional:      General: She is not in acute distress.    Appearance: Normal appearance. She is not ill-appearing.  HENT:     Head: Normocephalic and atraumatic.   Cardiovascular:     Rate and Rhythm: Normal rate and regular rhythm.  Pulmonary:     Effort: Pulmonary effort is normal.     Breath sounds: Normal breath sounds.  Abdominal:     General: There is no distension.     Palpations: Abdomen is soft.     Tenderness: There is no abdominal tenderness.   Skin:    General: Skin is warm and dry.   Neurological:     Mental Status: She is alert.        Lab Results  Component Value Date   WBC 8.0 08/06/2023   HGB 13.9 08/06/2023   HCT  43.3 08/06/2023   PLT 196 08/06/2023   GLUCOSE 89 08/06/2023   CHOL 189 07/05/2023   TRIG 67.0 07/05/2023   HDL 83.00 07/05/2023   LDLCALC 92 07/05/2023   ALT 17 08/06/2023   AST 29 08/06/2023   NA 136 08/06/2023   K 5.0 08/06/2023   CL 97 (L) 08/06/2023   CREATININE 0.97 08/06/2023   BUN 16 08/06/2023   CO2 26 08/06/2023   TSH 0.10 (L) 07/05/2023     Assessment & Plan:    Encounter Diagnoses  Name Primary?   Hyponatremia Yes   Hypothyroidism, unspecified type    Diarrhea of presumed infectious origin    Hyponatremia: Recent Related to diarrhea Resolved - Na normal in ED on 6/10 She is eating, drinking normally and does not have any diarrhea x 10 days Does not need to continue fluids with electrolytes Will hold off on repeat BMP since she has been stuck 9 times in the past 2 weeks  Hypothyroidism: Chronic Managed by Dr Nichole Just had tsh and ft4 done by him  Diarrhea: Resolved    I spent 20 minutes dedicated to the care of this patient on the date of this encounter including review of recent labs, ED notes, notes from out office, obtaining history, communicating with the patient, and documenting clinical information in the EHR

## 2023-08-16 ENCOUNTER — Ambulatory Visit (INDEPENDENT_AMBULATORY_CARE_PROVIDER_SITE_OTHER): Admitting: Internal Medicine

## 2023-08-16 VITALS — BP 110/78 | HR 67 | Temp 98.7°F | Ht 67.5 in | Wt 139.0 lb

## 2023-08-16 DIAGNOSIS — E039 Hypothyroidism, unspecified: Secondary | ICD-10-CM

## 2023-08-16 DIAGNOSIS — R197 Diarrhea, unspecified: Secondary | ICD-10-CM | POA: Diagnosis not present

## 2023-08-16 DIAGNOSIS — E871 Hypo-osmolality and hyponatremia: Secondary | ICD-10-CM

## 2023-08-16 NOTE — Patient Instructions (Addendum)
       Medications changes include :   None        Return if symptoms worsen or fail to improve.

## 2023-09-24 ENCOUNTER — Ambulatory Visit: Admitting: Gastroenterology

## 2023-09-24 ENCOUNTER — Encounter: Payer: Self-pay | Admitting: Gastroenterology

## 2023-09-24 VITALS — BP 124/70 | HR 64 | Ht 67.5 in | Wt 141.2 lb

## 2023-09-24 DIAGNOSIS — K5120 Ulcerative (chronic) proctitis without complications: Secondary | ICD-10-CM

## 2023-09-24 DIAGNOSIS — E871 Hypo-osmolality and hyponatremia: Secondary | ICD-10-CM

## 2023-09-24 DIAGNOSIS — K582 Mixed irritable bowel syndrome: Secondary | ICD-10-CM

## 2023-09-24 NOTE — Progress Notes (Signed)
 "                Christy [name]    [id number]    1955-09-03  Primary Care Physician:Crawford, Almarie LABOR, MD  Referring Physician: Rollene Almarie LABOR, MD [address] [place],  [place] [address]   Chief complaint:  Ulcerative proctitis  Discussed the use of AI scribe software for clinical note transcription with the patient, who gave verbal consent to proceed.  History of Present Illness Christy [name] is a 68 year old female who presents with chronic diarrhea and difficulty regulating bowel movements.  Chronic diarrhea and altered bowel habits - Onset around [facility], approximately two months ago - Initially severe, now improved and occurs intermittently - Stool consistency is 'real soft' rather than clear liquid - Typically one bowel movement per day, occasionally none without Miralax - No blood in stool - No significant abdominal pain or cramping, except for some discomfort on her side  Laxative use and dietary modifications - Uses Miralax to regulate bowel movements, previously one capful daily, now alternating between one capful and half a capful every other day - Stool consistency appears to depend on dietary intake, especially smoothies containing fruit, milk, and spinach - Started smoothies for hydration; suspects fiber content may contribute to diarrhea - Incorporates lemon water and a small amount of Himalayan sea salt into diet - Uses sugar-free liquid IVs, which may contribute to diarrhea due to non-absorbable sugars  Antibiotic exposure and infectious workup - Received antibiotics for a sinus infection around the onset of symptoms - Stool test in May negative for C. difficile infection  Abdominal discomfort and surgical history - History of abdominal surgeries including hysterectomy, C-section, and multiple laparoscopies - Known adhesions from prior surgeries - Experiences abdominal discomfort, particularly on her side - Finds relief with abdominal  massage    GI Hx: Fecal calprotectin was within normal limits.  August 2024    EGD October 24, 2022 - Z- line regular, 38 cm from the incisors. - No endoscopic esophageal abnormality to explain patient' s dysphagia. Esophagus dilated. Dilated. - Normal stomach. - Normal examined duodenum.   Colonoscopy 09-22-21  - Inflammation was not found on today's exam (based on the endoscopic appearance of the mucosa). This was graded as Mayo Score 0 (normal or inactive disease), quiescent compared to previous examinations.  - The examination was otherwise normal.  - No specimens collected.   Colonoscopy 08-22-18 - Four 1 to 2 mm polyps in the rectum and in the cecum, removed with a cold biopsy forceps. Resected and retrieved.  - One 5 mm polyp in the transverse colon, removed with a cold snare. Resected and retrieved.  - Non-bleeding internal hemorrhoids.  - Normal mucosa in the entire examined colon. 1. Surgical [P], transverse and cecum, polyps (2) - HYPERPLASTIC POLYP(S). - NO ADENOMATOUS CHANGE OR MALIGNANCY. 2. Surgical [P], rectum, polyps (3) - HYPERPLASTIC POLYP(S). - NO ADENOMATOUS CHANGE OR MALIGNANCY.   Flexible sigmoidoscopy May 04, 2016 proctitis otherwise normal exam.  Biopsy showed moderately active colitis, negative for CMV.   Colonoscopy May 25, 2013 by Dr. Obie showed normal colon, random biopsies were obtained, showed benign mucosa with no evidence of inflammatory bowel disease.  Recall colonoscopy in 5 years    Outpatient Encounter Medications as of 09/24/2023  Medication Sig   ALPRAZolam (XANAX) 1 MG tablet Take by mouth. 1 and 1/2 tablet   cholecalciferol (VITAMIN D ) 400 units TABS tablet Take 400 Units by mouth.  dicyclomine  (BENTYL ) 10 MG capsule Take 10 mg by mouth as needed for spasms.   famotidine  (PEPCID ) 20 MG tablet Take 1 tablet (20 mg total) by mouth at bedtime.   loratadine (CLARITIN) 10 MG tablet Take 10 mg by mouth.   meclizine  (ANTIVERT ) 12.5 MG tablet  TAKE 1 TABLET BY MOUTH 3 TIMES A DAY AS NEEDED FOR DIZZINESS   mesalamine  (APRISO ) 0.375 g 24 hr capsule Take 2 capsules (0.75 g total) by mouth daily.   ondansetron  (ZOFRAN ) 4 MG tablet Take 1 tablet (4 mg total) by mouth 2 (two) times daily as needed for nausea or vomiting.   PREMARIN vaginal cream Place 0.5 g vaginally 2 (two) times a week.   Probiotic Product (ALIGN) 4 MG CAPS Take by mouth.   SYNTHROID 150 MCG tablet Take 150 mcg by mouth daily.   tretinoin (RETIN-A) 0.025 % cream SMARTSIG:Sparingly Topical Every Evening   [DISCONTINUED] omeprazole  (PRILOSEC  OTC) 20 MG tablet TAKE 1 TABLET BY MOUTH EVERY DAY   [DISCONTINUED] Turmeric 06-998 MG CAPS Take 500 mg by mouth daily.   [DISCONTINUED] zoledronic  acid (RECLAST ) 5 MG/100ML SOLN injection    No facility-administered encounter medications on file as of 09/24/2023.    Allergies as of 09/24/2023 - Review Complete 09/24/2023  Allergen Reaction Noted   Adhesive [tape]  05/20/2013   Bupropion  09/01/2021   Hydrocodone Other (See Comments) 08/05/2023   Hydrocodone-ibuprofen  09/01/2021   Latex Hives 05/20/2013   Molds & smuts  09/01/2021   Oxycodone Itching 05/09/2012   Tetracycline     Tranxene [clorazepate dipotassium]  09/06/2010   Dust mite extract  01/30/2018    Past Medical History:  Diagnosis Date   Acute peptic ulcer, unspecified site, with hemorrhage, without mention of obstruction    Allergy    Anxiety    Arthritis    COVID-19 09/08/2020   Disorder of bone and cartilage, unspecified    Fibromyalgia    past hx   Frozen shoulder    GERD (gastroesophageal reflux disease)    Hyperlipidemia    PT UNAWARE OF THIS FINDING   Hypertension    07/2018- pt states no HTN since losing over 70#   Hyperthyroidism 08/2013   Internal hemorrhoids    Obstructive sleep apnea (adult) (pediatric)    Osteopenia    Other specific disorder of sleep of nonorganic origin    PONV (postoperative nausea and vomiting)    Restless legs  syndrome (RLS)    Sleep apnea    07/2018 pt states surgery to correct but states she still has sleep apnea, just not as bad   Tubular adenoma of colon    Ulcerative proctitis (HCC)    Unspecified hypothyroidism    Vaginitis 2018   Vitamin D  deficiency     Past Surgical History:  Procedure Laterality Date   ABDOMINAL HYSTERECTOMY     BREAST CYST ASPIRATION Right    CESAREAN SECTION     COLONOSCOPY N/A 05/25/2013   Procedure: COLONOSCOPY;  Surgeon: Princella CHRISTELLA Nida, MD;  Location: [facility];  Service: Endoscopy;  Laterality: N/A;   COLONOSCOPY     KNEE ARTHROSCOPY Right    x 2   LAPAROSCOPY     x 4   NASAL SEPTUM SURGERY     SIGMOIDOSCOPY     TONSILLECTOMY AND ADENOIDECTOMY     UPPER GASTROINTESTINAL ENDOSCOPY      Family History  Problem Relation Age of Onset   Kidney disease Mother    Diabetes Mother  Diabetes Father    Other Father        died from a brain bleed   Melanoma Sister    Other Sister        hx of PE's   Lung cancer Maternal Grandfather    Breast cancer Paternal Grandmother    Colon cancer Paternal Grandmother    Leukemia Paternal Grandfather    Colon cancer Other        grandmother   Breast cancer Other    Breast cancer Other        aunt   Other Daughter        PE while on birth control   Colon polyps Neg Hx    Esophageal cancer Neg Hx    Stomach cancer Neg Hx    Rectal cancer Neg Hx     Social History   Socioeconomic History   Marital status: Married    Spouse name: Not on file   Number of children: Not on file   Years of education: Not on file   Highest education level: Associate degree: occupational, scientist, product/process development, or vocational program  Occupational History   Occupation: Manufacturing engineer  Tobacco Use   Smoking status: Former    Current packs/day: 0.00    Average packs/day: 0.1 packs/day for 15.0 years (1.5 ttl pk-yrs)    Types: Cigarettes    Start date: 02/26/1965    Quit date: 02/27/1980    Years since quitting: 43.6   Smokeless  tobacco: Never  Vaping Use   Vaping status: Never Used  Substance and Sexual Activity   Alcohol use: No   Drug use: No   Sexual activity: Not on file  Other Topics Concern   Not on file  Social History Narrative   Not on file   Social Drivers of Health   Financial Resource Strain: Low Risk  (08/09/2023)   Overall Financial Resource Strain (CARDIA)    Difficulty of Paying Living Expenses: Not hard at all  Food Insecurity: No Food Insecurity (08/09/2023)   Hunger Vital Sign    Worried About Running Out of Food in the Last Year: Never true    Ran Out of Food in the Last Year: Never true  Transportation Needs: No Transportation Needs (08/09/2023)   PRAPARE - Administrator, Civil Service (Medical): No    Lack of Transportation (Non-Medical): No  Physical Activity: Sufficiently Active (08/09/2023)   Exercise Vital Sign    Days of Exercise per Week: 3 days    Minutes of Exercise per Session: 60 min  Stress: Stress Concern Present (08/09/2023)   [employer] of Occupational Health - Occupational Stress Questionnaire    Feeling of Stress: To some extent  Social Connections: Socially Integrated (08/09/2023)   Social Connection and Isolation Panel    Frequency of Communication with Friends and Family: Three times a week    Frequency of Social Gatherings with Friends and Family: Patient declined    Attends Religious Services: 1 to 4 times per year    Active Member of [employer] or Organizations: Yes    Attends Banker Meetings: 1 to 4 times per year    Marital Status: Married  Catering Manager Violence: Not on file      Review of systems: All other review of systems negative except as mentioned in the HPI.   Physical Exam: Vitals:   09/24/23 1415  BP: 124/70  Pulse: 64   Body mass index is 21.79 kg/m. Gen:  No acute distress HEENT:  sclera anicteric Abd:      soft, non-tender; no palpable masses, no distension Ext:    No edema Neuro: alert and  oriented x 3 Psych: normal mood and affect  Data Reviewed:  Reviewed labs, radiology imaging, old records and pertinent past GI work up     Assessment and Plan Assessment & Plan IBS constipation and diarrhea: Chronic altered bowel habits due to intestinal adhesions and functional diarrhea Chronic altered bowel habits with intermittent diarrhea since Memorial Day. Diarrhea is more related to consistency than frequency, with bowel movements once or twice daily. Symptoms are influenced by Miralax dosage and dietary intake, including smoothies and liquid IVs. No alarming features such as blood in stool or severe abdominal pain. Adhesions from previous surgeries (hysterectomy, C-section) may contribute to bowel irregularities. No evidence of C. diff infection from previous stool test in May. Symptoms have improved but not resolved. Abdominal massage can alleviate discomfort from adhesions. Surgery for adhesions is not recommended unless bowel obstruction occurs. - Adjust Miralax dosage to half a capful daily, with flexibility to adjust based on symptoms. - Avoid excessive liquid IVs; consider holding off to assess impact on symptoms. - Continue dietary modifications, including smoothies, while monitoring for symptom triggers. - Perform abdominal massage to alleviate discomfort from adhesions. - Monitor for any worsening of symptoms, such as increased frequency of diarrhea or presence of blood in stool.  Difficulty with hydration management and hypo-osmolality/hyponatremia Difficulty managing hydration due to previous recommendations to avoid clear water and excessive liquid IVs. Sodium levels are well-managed. Hydration strategy includes smoothies and lemon water with Himalayan sea salt. Over-focus on hydration management may contribute to stress and difficulty in regulation. Excessive liquid IVs can cause diarrhea due to unabsorbed sugars. - Maintain fluid intake around sixty ounces per day,  adjusting based on thirst and body signals. - Limit liquid IVs to one per week or less, focusing on other hydration sources like smoothies and lemon water. - Avoid over-focusing on hydration management; listen to body's signals for thirst.        This visit required >40 minutes of patient care (this includes precharting, chart review, review of results, face-to-face time used for counseling as well as treatment plan and follow-up. The patient was provided an opportunity to ask questions and all were answered. The patient agreed with the plan and demonstrated an understanding of the instructions.  LOIS Wilkie Mcgee , MD    CC: Rollene Almarie LABOR, *    "

## 2023-09-24 NOTE — Patient Instructions (Addendum)
 VISIT SUMMARY:  During your visit, we discussed your chronic diarrhea and difficulty regulating bowel movements, which started around [facility] Day. We reviewed your use of Miralax, dietary habits, and hydration strategies. We also considered the impact of your previous abdominal surgeries on your symptoms.  YOUR PLAN:  CHRONIC ALTERED BOWEL HABITS: You have been experiencing intermittent diarrhea and altered bowel habits since [facility], likely influenced by your diet, Miralax use, and previous abdominal surgeries. -Adjust Miralax dosage to half a capful daily, with flexibility to adjust based on symptoms. -Avoid excessive liquid IVs; consider holding off to assess impact on symptoms. -Continue dietary modifications, including smoothies, while monitoring for symptom triggers. -Perform abdominal massage to alleviate discomfort from adhesions. -Monitor for any worsening of symptoms, such as increased frequency of diarrhea or presence of blood in stool.  HYDRATION MANAGEMENT: You have had difficulty managing hydration, which may be contributing to your symptoms. Your current strategy includes smoothies and lemon water with Himalayan sea salt. -Maintain fluid intake around sixty ounces per day, adjusting based on thirst and body signals. -Limit liquid IVs to one per week or less, focusing on other hydration sources like smoothies and lemon water. -Avoid over-focusing on hydration management; listen to your body's signals for thirst.  I appreciate the  opportunity to care for you  Thank You   Kavitha Nandigam , MD

## 2023-12-02 ENCOUNTER — Ambulatory Visit: Payer: Self-pay

## 2023-12-02 NOTE — Telephone Encounter (Signed)
 FYI Only or Action Required?: FYI only for provider.  Patient was last seen in primary care on 08/16/2023 by Geofm Glade PARAS, MD.  Called Nurse Triage reporting Advice Only.  Symptoms began n/a.  Interventions attempted: Other: n/a.  Symptoms are: n/a.  Triage Disposition: Information or Advice Only Call  Patient/caregiver understands and will follow disposition?: Yes   Copied from CRM [id number]. Topic: Clinical - Medical Advice >> Dec 02, 2023  9:01 AM Christy [name] wrote: Reason for CRM: Patient tested positive for covid last sunday, got a prescription for palovid finsihed that fridayy, tested positive on saturday and this mornign wanted to know if she is still contiguous and what she needs to do from here , would like a callback Reason for Disposition  Health information question, no triage required and triager able to answer question  Answer Assessment - Initial Assessment Questions 1. REASON FOR CALL: What is the main reason for your call? or How can I best help you?   Patient tested positive for COVID after completing Paxlovid and would like to know if she is still contagious  2. SYMPTOMS : Do you have any symptoms?      Congestion and sluggish, denies fever   Explained that it is still possible to test positive, but that she is no longer contagious because she is past the five day window since her symptoms began and that it is safe for he to be around others.   She verbalizes understanding  Protocols used: Information Only Call - No Triage-A-AH

## 2023-12-04 NOTE — Telephone Encounter (Signed)
 Called patient and informed her that she did not need to restest herself and that she can get her flu and covid once her congestion has went away pt verbalized she understood

## 2023-12-04 NOTE — Telephone Encounter (Signed)
 1.  No no need for more COVID testing 2.  She is not contagious any longer.   Thanks

## 2023-12-12 ENCOUNTER — Telehealth: Payer: Self-pay | Admitting: Gastroenterology

## 2023-12-12 NOTE — Telephone Encounter (Signed)
 Inbound call from patient stating she has been having diarrhea and dehydration after taking antibiotics. Patient is requesting a call to discuss solutions. Please advise, thank you.

## 2023-12-12 NOTE — Telephone Encounter (Signed)
 Patient on amoxicillin for 5 days. She had dental surgery. She is having loose stools, but not in excess of 3 daily. Patient would like to proactively prevent dehydration. Reviewed recommendations from her previous office visit. Encourage broth soups, avoid artificial sweeteners and could try sipping Pedialyte electrolyte solution. She wants to add probiotic. Encouraged to discontinue use of probiotic if she develops worsening symptoms and call GI.

## 2024-03-03 ENCOUNTER — Telehealth: Payer: Self-pay | Admitting: Gastroenterology

## 2024-03-03 NOTE — Telephone Encounter (Signed)
 Inbound call requesting to speak with a nurse regarding acid reflux flare. PT has been taking  omeprazole  but still has a chronic cough that's worst in the morning. Please advise.

## 2024-03-03 NOTE — Telephone Encounter (Signed)
"  Patient advised  "

## 2024-03-03 NOTE — Telephone Encounter (Signed)
 Patient of Dr Shila. History of GERD. Usually takes only famotidine  at hs for GERD symptoms and does fine. Had stopped omeprazole  20 mg, but had to restart it due to return of reflux which has flared up with a vengeance. Restarted it 4 weeks ago and it has helped. Takes Pepcid  at bedtime. Elevates head of the bed. Does not eat past a certain time and I have one cup of coffee a day. No tea or sodas. She now has a cough particularly bad in the morning. No sinus congestion or postnasal drip. Denies symptoms of URI. Patient says the omeprazole  wears off about half way through the day.

## 2024-03-08 ENCOUNTER — Other Ambulatory Visit: Payer: Self-pay | Admitting: Gastroenterology

## 2024-03-16 NOTE — Telephone Encounter (Signed)
 Contacted the patient and scheduled an appointment. She is havng difficulty sleeping now due to the reflux and cough.

## 2024-03-16 NOTE — Telephone Encounter (Signed)
 PT is calling to update that she is not feeling any better and wants to know if she should schedule a FU. Please Advise.

## 2024-03-20 ENCOUNTER — Ambulatory Visit
Admission: RE | Admit: 2024-03-20 | Discharge: 2024-03-20 | Disposition: A | Source: Ambulatory Visit | Attending: Gastroenterology

## 2024-03-20 ENCOUNTER — Ambulatory Visit: Admitting: Gastroenterology

## 2024-03-20 ENCOUNTER — Encounter: Payer: Self-pay | Admitting: Gastroenterology

## 2024-03-20 VITALS — BP 110/64 | HR 59 | Ht 67.5 in | Wt 135.0 lb

## 2024-03-20 DIAGNOSIS — M940 Chondrocostal junction syndrome [Tietze]: Secondary | ICD-10-CM

## 2024-03-20 DIAGNOSIS — R052 Subacute cough: Secondary | ICD-10-CM

## 2024-03-20 DIAGNOSIS — K219 Gastro-esophageal reflux disease without esophagitis: Secondary | ICD-10-CM | POA: Diagnosis not present

## 2024-03-20 MED ORDER — OMEPRAZOLE 20 MG PO CPDR: 20.0000 mg | DELAYED_RELEASE_CAPSULE | Freq: Two times a day (BID) | ORAL | 3 refills | Status: AC

## 2024-03-20 NOTE — Patient Instructions (Addendum)
 VISIT SUMMARY:  During your visit, we discussed your persistent cough, chest wall pain, and gastroesophageal reflux disease. We reviewed your symptoms, current medications, and dietary habits, and made adjustments to your treatment plan to help manage your symptoms.  YOUR PLAN:  GASTROESOPHAGEAL REFLUX DISEASE: Your reflux symptoms have worsened, likely due to your cough and postnasal drip. -Continue taking omeprazole  20 mg twice daily before breakfast and dinner. -Take Pepcid  after dinner, but not at the same time as omeprazole . -Use Mylanta as needed for breakthrough symptoms. -Avoid chocolate, potato chips, and heavy evening meals. Limit coffee, chocolate, and carbonated beverages to before 1 PM. -Eat small, frequent meals every 2-3 hours to prevent weight loss and symptom exacerbation. -Avoid lying down immediately after taking nighttime medications. -Follow up in two months to reassess your symptoms and management.  SUBACUTE COUGH WITH CHEST WALL PAIN AND POSTNASAL DRIP: Your cough, nasal congestion, and postnasal drip are contributing to chest wall pain and decreased oral intake. -Use your prescription cough suppressant as needed for relief. -Continue using Flonase  and saline nasal irrigation for nasal congestion and postnasal drip. -Use Mylanta as needed for cough-induced reflux symptoms. -Get a chest x-ray to rule out any lung issues or other causes of chest pain. -Monitor for worsening congestion and contact your primary care provider if symptoms worsen.  COSTOCHONDRITIS: Your chest wall pain is likely due to persistent coughing, causing localized tenderness. -Get a chest x-ray to rule out rib fracture or other issues. -Rest and avoid vigorous activities that may worsen the pain. -Use lidocaine patches (Salonpas) and topical pain relievers like Bengay for local pain relief. -Take NSAIDs as needed for pain control.  Go to the basement today for a chest Xray  We have sent the  following medications to your pharmacy for you to pick up at your convenience: Omeprazole   Due to recent changes in healthcare laws, you may see the results of your imaging and laboratory studies on MyChart before your provider has had a chance to review them.  We understand that in some cases there may be results that are confusing or concerning to you. Not all laboratory results come back in the same time frame and the provider may be waiting for multiple results in order to interpret others.  Please give us  48 hours in order for your provider to thoroughly review all the results before contacting the office for clarification of your results.    I appreciate the  opportunity to care for you  Thank You   Patric Vanpelt , MD

## 2024-03-20 NOTE — Progress Notes (Signed)
 "                Christy [name]    [id number]    21-Jul-1955  Primary Care Physician:Crawford, Almarie LABOR, MD  Referring Physician: Rollene Almarie LABOR, MD [address] [place],  [place] [address]   Chief complaint: Cough, rib pain  Discussed the use of AI scribe software for clinical note transcription with the patient, who gave verbal consent to proceed.  History of Present Illness Christy [name] is a 69 year old female with gastroesophageal reflux disease who presents for evaluation of persistent cough.  Cough and Upper Respiratory Symptoms: - Persistent cough began in December 2025, several months after COVID-19 infection in September 2025 - Cough is currently the most bothersome symptom, significantly impacting quality of life and ability to eat - Recent worsening of cough - No new respiratory illness at onset and no recent exposure to sick contacts - Various cough medicines, including Tigan and Robitussin, aggravated digestive symptoms - Uses prescription cough suppressant from prior COVID-19 episode as needed - Mylanta used at night for digestive symptoms related to coughing - Nasal congestion and intermittent postnasal drip developed in the past few days - Occasional sensation of drip in the back of throat - Flonase  and salt water irrigation provide some relief  Chest Wall Pain: - Left-sided chest pain developed, attributed to pulling a muscle from excessive coughing - Pain is localized and worsens with certain movements and deep breaths - No trauma, falls, or activities that could have caused injury - Lidocaine patch (Salonpas) provided some relief  Gastroesophageal Reflux Disease: - Managed with omeprazole  twice daily (before breakfast and dinner) and Pepcid  (famotidine ) at bedtime (after dinner) - Feels Pepcid  is less effective - Mylanta used as needed at night - Coughing exacerbates reflux symptoms - Avoids dietary triggers such as chocolate and potato  chips - Limits coffee to one cup daily and avoids carbonated drinks, chocolate, coffee, and tea after 1 PM - Significant hunger present; tries to eat every two hours to avoid further weight loss  Nutritional Status and Oral Intake: - Extensive oral surgery in November 2025 and additional dental work led to initial weight loss - Weight regained in December but lost again recently, likely due to decreased oral intake from coughing and dietary limitations - Eating soft foods for several months and tries not to overeat - Maintains regular exercise   GI Hx: Fecal calprotectin was within normal limits.  August 2024     EGD October 24, 2022 - Z- line regular, 38 cm from the incisors. - No endoscopic esophageal abnormality to explain patient' s dysphagia. Esophagus dilated. Dilated. - Normal stomach. - Normal examined duodenum.   Colonoscopy 09-22-21  - Inflammation was not found on today's exam (based on the endoscopic appearance of the mucosa). This was graded as Mayo Score 0 (normal or inactive disease), quiescent compared to previous examinations.  - The examination was otherwise normal.  - No specimens collected.   Colonoscopy 08-22-18 - Four 1 to 2 mm polyps in the rectum and in the cecum, removed with a cold biopsy forceps. Resected and retrieved.  - One 5 mm polyp in the transverse colon, removed with a cold snare. Resected and retrieved.  - Non-bleeding internal hemorrhoids.  - Normal mucosa in the entire examined colon. 1. Surgical [P], transverse and cecum, polyps (2) - HYPERPLASTIC POLYP(S). - NO ADENOMATOUS CHANGE OR MALIGNANCY. 2. Surgical [P], rectum, polyps (3) - HYPERPLASTIC POLYP(S). - NO ADENOMATOUS CHANGE  OR MALIGNANCY.   Flexible sigmoidoscopy May 04, 2016 proctitis otherwise normal exam.  Biopsy showed moderately active colitis, negative for CMV.   Colonoscopy May 25, 2013 by Dr. Obie showed normal colon, random biopsies were obtained, showed benign mucosa with no  evidence of inflammatory bowel disease.  Recall colonoscopy in 5 years     Outpatient Encounter Medications as of 03/20/2024  Medication Sig   ALPRAZolam (XANAX) 1 MG tablet Take by mouth. 1 and 1/2 tablet   cholecalciferol (VITAMIN D ) 400 units TABS tablet Take 400 Units by mouth.   dicyclomine  (BENTYL ) 10 MG capsule Take 10 mg by mouth as needed for spasms.   famotidine  (PEPCID ) 20 MG tablet Take 1 tablet (20 mg total) by mouth at bedtime.   loratadine (CLARITIN) 10 MG tablet Take 10 mg by mouth.   meclizine  (ANTIVERT ) 12.5 MG tablet TAKE 1 TABLET BY MOUTH 3 TIMES A DAY AS NEEDED FOR DIZZINESS   mesalamine  (APRISO ) 0.375 g 24 hr capsule TAKE 2 CAPSULES (0.75 G TOTAL) BY MOUTH DAILY.   omeprazole  (PRILOSEC ) 20 MG capsule Take 20 mg by mouth 2 (two) times daily before a meal.   ondansetron  (ZOFRAN ) 4 MG tablet Take 1 tablet (4 mg total) by mouth 2 (two) times daily as needed for nausea or vomiting.   PREMARIN vaginal cream Place 0.5 g vaginally 2 (two) times a week.   Probiotic Product (ALIGN) 4 MG CAPS Take by mouth.   SYNTHROID 150 MCG tablet Take 150 mcg by mouth daily.   tretinoin (RETIN-A) 0.025 % cream SMARTSIG:Sparingly Topical Every Evening   No facility-administered encounter medications on file as of 03/20/2024.    Allergies as of 03/20/2024 - Review Complete 03/20/2024  Allergen Reaction Noted   Adhesive [tape]  05/20/2013   Bupropion  09/01/2021   Hydrocodone Other (See Comments) 08/05/2023   Hydrocodone-ibuprofen  09/01/2021   Latex Hives 05/20/2013   Molds & smuts  09/01/2021   Oxycodone Itching 05/09/2012   Tetracycline     Tranxene [clorazepate dipotassium]  09/06/2010   Dust mite extract  01/30/2018    Past Medical History:  Diagnosis Date   Acute peptic ulcer, unspecified site, with hemorrhage, without mention of obstruction    Allergy    Anxiety    Arthritis    COVID-19 09/08/2020   Disorder of bone and cartilage, unspecified    Fibromyalgia    past hx    Frozen shoulder    GERD (gastroesophageal reflux disease)    Hyperlipidemia    PT UNAWARE OF THIS FINDING   Hypertension    07/2018- pt states no HTN since losing over 70#   Hyperthyroidism 08/2013   Internal hemorrhoids    Obstructive sleep apnea (adult) (pediatric)    Osteopenia    Other specific disorder of sleep of nonorganic origin    PONV (postoperative nausea and vomiting)    Restless legs syndrome (RLS)    Sleep apnea    07/2018 pt states surgery to correct but states she still has sleep apnea, just not as bad   Tubular adenoma of colon    Ulcerative proctitis (HCC)    Unspecified hypothyroidism    Vaginitis 2018   Vitamin D  deficiency     Past Surgical History:  Procedure Laterality Date   ABDOMINAL HYSTERECTOMY     BREAST CYST ASPIRATION Right    CATARACT EXTRACTION Bilateral    CESAREAN SECTION     COLONOSCOPY N/A 05/25/2013   Procedure: COLONOSCOPY;  Surgeon: Princella CHRISTELLA Obie, MD;  Location: [facility];  Service: Endoscopy;  Laterality: N/A;   COLONOSCOPY     KNEE ARTHROSCOPY Right    x 2   LAPAROSCOPY     x 4   NASAL SEPTUM SURGERY     SIGMOIDOSCOPY     TONSILLECTOMY AND ADENOIDECTOMY     UPPER GASTROINTESTINAL ENDOSCOPY      Family History  Problem Relation Age of Onset   Kidney disease Mother    Diabetes Mother    Diabetes Father    Other Father        died from a brain bleed   Melanoma Sister    Other Sister        hx of PE's   Lung cancer Maternal Grandfather    Breast cancer Paternal Grandmother    Colon cancer Paternal Grandmother    Leukemia Paternal Grandfather    Colon cancer Other        grandmother   Breast cancer Other    Breast cancer Other        aunt   Other Daughter        PE while on birth control   Colon polyps Neg Hx    Esophageal cancer Neg Hx    Stomach cancer Neg Hx    Rectal cancer Neg Hx     Social History   Socioeconomic History   Marital status: Married    Spouse name: Not on file   Number of children: Not  on file   Years of education: Not on file   Highest education level: Associate degree: occupational, scientist, product/process development, or vocational program  Occupational History   Occupation: Manufacturing engineer  Tobacco Use   Smoking status: Former    Current packs/day: 0.00    Average packs/day: 0.1 packs/day for 15.0 years (1.5 ttl pk-yrs)    Types: Cigarettes    Start date: 02/26/1965    Quit date: 02/27/1980    Years since quitting: 44.0   Smokeless tobacco: Never  Vaping Use   Vaping status: Never Used  Substance and Sexual Activity   Alcohol use: No   Drug use: No   Sexual activity: Not on file  Other Topics Concern   Not on file  Social History Narrative   Not on file   Social Drivers of Health   Tobacco Use: Medium Risk (03/20/2024)   Patient History    Smoking Tobacco Use: Former    Smokeless Tobacco Use: Never    Passive Exposure: Not on file  Financial Resource Strain: Low Risk (08/09/2023)   Overall Financial Resource Strain (CARDIA)    Difficulty of Paying Living Expenses: Not hard at all  Food Insecurity: No Food Insecurity (08/09/2023)   Epic    Worried About Programme Researcher, Broadcasting/film/video in the Last Year: Never true    Ran Out of Food in the Last Year: Never true  Transportation Needs: No Transportation Needs (08/09/2023)   Epic    Lack of Transportation (Medical): No    Lack of Transportation (Non-Medical): No  Physical Activity: Sufficiently Active (08/09/2023)   Exercise Vital Sign    Days of Exercise per Week: 3 days    Minutes of Exercise per Session: 60 min  Stress: Stress Concern Present (08/09/2023)   [employer] of Occupational Health - Occupational Stress Questionnaire    Feeling of Stress: To some extent  Social Connections: Socially Integrated (08/09/2023)   Social Connection and Isolation Panel    Frequency of Communication with Friends and Family: Three times a  week    Frequency of Social Gatherings with Friends and Family: Patient declined    Attends Religious  Services: 1 to 4 times per year    Active Member of [employer] or Organizations: Yes    Attends Banker Meetings: 1 to 4 times per year    Marital Status: Married  Catering Manager Violence: Not on file  Depression (PHQ2-9): Low Risk (08/16/2023)   Depression (PHQ2-9)    PHQ-2 Score: 1  Alcohol Screen: Not on file  Housing: Low Risk (08/09/2023)   Epic    Unable to Pay for Housing in the Last Year: No    Number of Times Moved in the Last Year: 0    Homeless in the Last Year: No  Utilities: Not on file  Health Literacy: Not on file      Review of systems: All other review of systems negative except as mentioned in the HPI.   Physical Exam: Vitals:   03/20/24 1109  BP: 110/64  Pulse: (!) 59   Body mass index is 20.83 kg/m. Gen:      No acute distress HEENT:  sclera anicteric CV: s1s2 rrr, no murmur Lungs: B/l clear.  Left anterior chest wall tenderness on palpation Abd:      soft, non-tender; no palpable masses, no distension Ext:    No edema Neuro: alert and oriented x 3 Psych: normal mood and affect  Data Reviewed:  Reviewed labs, radiology imaging, old records and pertinent past GI work up   Assessment & Plan Gastroesophageal reflux disease Chronic gastroesophageal reflux disease with recent symptom exacerbation attributed to cough and postnasal drip. No acute complications identified. Symptoms managed with acid suppression and dietary modifications. - Refilled omeprazole  prescription. - Instructed her to continue omeprazole  20 mg twice daily before breakfast and dinner. - Advised Pepcid  after dinner, not concurrently with omeprazole . - Recommended Mylanta as needed for breakthrough symptoms. - Provided dietary counseling: avoid chocolate, potato chips, and heavy evening meals; limit coffee, chocolate, and carbonated beverages to before 1 PM; eat small, frequent meals every 2-3 hours to prevent weight loss and symptom exacerbation. - Encouraged her to  avoid lying down immediately after nighttime medications. - Scheduled follow-up in two months to reassess symptoms and management.  Subacute cough with chest wall pain and postnasal drip Subacute, non-infectious cough with nasal congestion and postnasal drip, contributing to chest wall pain and decreased oral intake. No evidence of acute respiratory infection or pneumonia on examination. - Advised prescription cough suppressant as needed for symptomatic relief. - Instructed her to continue Flonase  and saline nasal irrigation for nasal congestion and postnasal drip. - Reinforced use of Mylanta as needed for cough-induced reflux symptoms. - Ordered chest x-ray to rule out pulmonary pathology and other causes of chest pain. - Advised her to monitor for worsening congestion and to contact primary care provider if symptoms worsen.  Costochondritis Costochondritis likely secondary to persistent cough, with localized left chest wall tenderness. No evidence of trauma, pneumonia, or rib fracture on examination. Symptoms exacerbated by movement and deep inspiration. - Ordered chest x-ray to rule out rib fracture or other pathology. - Advised rest and avoidance of vigorous activity that may exacerbate chest wall pain. - Recommended lidocaine patch (Salonpas) and topical analgesics (e.g., Bengay) for local pain relief. - Suggested NSAIDs as needed for pain control.    This visit required >40 minutes of patient care (this includes precharting, chart review, review of results, face-to-face time used for counseling as well as treatment  plan and follow-up. The patient was provided an opportunity to ask questions and all were answered. The patient agreed with the plan and demonstrated an understanding of the instructions.  LOIS Wilkie Mcgee , MD    CC: Rollene Almarie LABOR, MD    "

## 2024-03-24 ENCOUNTER — Ambulatory Visit: Payer: Self-pay | Admitting: Gastroenterology

## 2024-03-24 ENCOUNTER — Ambulatory Visit: Payer: Self-pay

## 2024-03-24 NOTE — Telephone Encounter (Signed)
 FYI Only or Action Required?: FYI only for provider: appointment scheduled on 1/28.  Patient was last seen in primary care on 08/16/2023 by Geofm Glade PARAS, MD.  Called Nurse Triage reporting Cough.  Symptoms began several weeks ago.  Interventions attempted: OTC medications: Robitussin.  Symptoms are: unchanged.  Triage Disposition: See Physician Within 24 Hours  Patient/caregiver understands and will follow disposition?: Yes      Message from [place] [facility] sent at 03/24/2024  9:47 AM EST  Summary: cough,discolored mucous   Reason for Triage: cough,discolored mucous ,dark with blood      Reason for Disposition  [1] Continuous (nonstop) coughing interferes with work or school AND [2] no improvement using cough treatment per Care Advice  Answer Assessment - Initial Assessment Questions 1. ONSET: When did the cough begin?      X 6 weeks   2. SEVERITY: How bad is the cough today?      Intermittent   3. SPUTUM: Describe the color of your sputum (e.g., none, dry cough; clear, white, yellow, green)     Yellow   4. HEMOPTYSIS: Are you coughing up any blood? If Yes, ask: How much? (e.g., flecks, streaks, tablespoons, etc.)     No   5. DIFFICULTY BREATHING: Are you having difficulty breathing? If Yes, ask: How bad is it? (e.g., mild, moderate, severe)      No   6. FEVER: Do you have a fever? If Yes, ask: What is your temperature, how was it measured, and when did it start?     No   7. CARDIAC HISTORY: Do you have any history of heart disease? (e.g., heart attack, congestive heart failure)      No   8. LUNG HISTORY: Do you have any history of lung disease?  (e.g., pulmonary embolus, asthma, emphysema)  No   10. OTHER SYMPTOMS: Do you have any other symptoms? (e.g., runny nose, wheezing, chest pain)       No      Patient called in to triage with complaints of productive cough  This has been ongoing for approx. 6 weeks.  The patient stated she was  seen last Friday by a GI specialist, and was advised to follow up with PCP for a possible respiratory infection. Xrays were ordered during that time.  For home care, the patient is taking Robitussin.   Video visit  scheduled for further evaluation, as the roads are not clear in the patients area. Patient agrees with the plan of care, and will reach out if symptoms worsen or persist.  Protocols used: Cough - Acute Productive-A-AH

## 2024-03-25 ENCOUNTER — Encounter: Payer: Self-pay | Admitting: Internal Medicine

## 2024-03-25 ENCOUNTER — Telehealth: Admitting: Internal Medicine

## 2024-03-25 DIAGNOSIS — J0110 Acute frontal sinusitis, unspecified: Secondary | ICD-10-CM

## 2024-03-25 DIAGNOSIS — K219 Gastro-esophageal reflux disease without esophagitis: Secondary | ICD-10-CM

## 2024-03-25 DIAGNOSIS — R053 Chronic cough: Secondary | ICD-10-CM | POA: Diagnosis not present

## 2024-03-25 DIAGNOSIS — R0982 Postnasal drip: Secondary | ICD-10-CM

## 2024-03-25 MED ORDER — PREDNISONE 20 MG PO TABS: 40.0000 mg | ORAL_TABLET | Freq: Every day | ORAL | 0 refills | Status: AC

## 2024-03-25 MED ORDER — PROMETHAZINE-DM 6.25-15 MG/5ML PO SYRP: 5.0000 mL | ORAL_SOLUTION | ORAL | 1 refills | Status: AC | PRN

## 2024-03-27 DIAGNOSIS — R053 Chronic cough: Secondary | ICD-10-CM | POA: Insufficient documentation

## 2024-03-27 DIAGNOSIS — J019 Acute sinusitis, unspecified: Secondary | ICD-10-CM | POA: Insufficient documentation

## 2024-03-27 DIAGNOSIS — R0982 Postnasal drip: Secondary | ICD-10-CM | POA: Insufficient documentation

## 2024-04-08 ENCOUNTER — Encounter: Admitting: Internal Medicine

## 2024-05-21 ENCOUNTER — Ambulatory Visit: Admitting: Gastroenterology
